# Patient Record
Sex: Female | Born: 1940 | Race: Black or African American | Hispanic: No | Smoking: Former smoker
Health system: Southern US, Community
[De-identification: ages and names within clinical notes are randomized; demographics above are authoritative.]

## PROBLEM LIST (undated history)

## (undated) DIAGNOSIS — F411 Generalized anxiety disorder: Secondary | ICD-10-CM

## (undated) DIAGNOSIS — K219 Gastro-esophageal reflux disease without esophagitis: Secondary | ICD-10-CM

## (undated) DIAGNOSIS — E119 Type 2 diabetes mellitus without complications: Secondary | ICD-10-CM

## (undated) DIAGNOSIS — T7840XA Allergy, unspecified, initial encounter: Secondary | ICD-10-CM

## (undated) DIAGNOSIS — I10 Essential (primary) hypertension: Secondary | ICD-10-CM

## (undated) DIAGNOSIS — K529 Noninfective gastroenteritis and colitis, unspecified: Secondary | ICD-10-CM

## (undated) DIAGNOSIS — J449 Chronic obstructive pulmonary disease, unspecified: Secondary | ICD-10-CM

## (undated) DIAGNOSIS — F419 Anxiety disorder, unspecified: Secondary | ICD-10-CM

## (undated) DIAGNOSIS — G4733 Obstructive sleep apnea (adult) (pediatric): Secondary | ICD-10-CM

## (undated) DIAGNOSIS — M109 Gout, unspecified: Secondary | ICD-10-CM

## (undated) DIAGNOSIS — M1990 Unspecified osteoarthritis, unspecified site: Secondary | ICD-10-CM

## (undated) DIAGNOSIS — N289 Disorder of kidney and ureter, unspecified: Secondary | ICD-10-CM

## (undated) HISTORY — DX: Type 2 diabetes mellitus without complications: E11.9

## (undated) HISTORY — DX: Allergy, unspecified, initial encounter: T78.40XA

## (undated) HISTORY — DX: Generalized anxiety disorder: F41.1

## (undated) HISTORY — DX: Gastro-esophageal reflux disease without esophagitis: K21.9

## (undated) HISTORY — PX: BREAST EXCISIONAL BIOPSY: SUR124

## (undated) HISTORY — PX: COLONOSCOPY: SHX174

## (undated) HISTORY — PX: ABDOMINAL SURGERY: SHX537

## (undated) HISTORY — DX: Chronic obstructive pulmonary disease, unspecified: J44.9

## (undated) HISTORY — DX: Anxiety disorder, unspecified: F41.9

## (undated) HISTORY — PX: KNEE SURGERY: SHX244

## (undated) HISTORY — DX: Obstructive sleep apnea (adult) (pediatric): G47.33

## (undated) HISTORY — PX: ABDOMINAL HYSTERECTOMY: SHX81

## (undated) HISTORY — DX: Noninfective gastroenteritis and colitis, unspecified: K52.9

---

## 1999-03-09 ENCOUNTER — Ambulatory Visit (HOSPITAL_COMMUNITY): Admission: RE | Admit: 1999-03-09 | Discharge: 1999-03-09 | Payer: Self-pay | Admitting: Family Medicine

## 1999-04-24 ENCOUNTER — Ambulatory Visit (HOSPITAL_COMMUNITY): Admission: RE | Admit: 1999-04-24 | Discharge: 1999-04-24 | Payer: Self-pay | Admitting: Internal Medicine

## 1999-08-09 ENCOUNTER — Emergency Department (HOSPITAL_COMMUNITY): Admission: EM | Admit: 1999-08-09 | Discharge: 1999-08-09 | Payer: Self-pay | Admitting: Internal Medicine

## 1999-08-09 ENCOUNTER — Encounter: Payer: Self-pay | Admitting: Internal Medicine

## 2000-04-03 ENCOUNTER — Encounter: Payer: Self-pay | Admitting: Family Medicine

## 2000-04-03 ENCOUNTER — Ambulatory Visit (HOSPITAL_COMMUNITY): Admission: RE | Admit: 2000-04-03 | Discharge: 2000-04-03 | Payer: Self-pay | Admitting: Family Medicine

## 2000-08-15 ENCOUNTER — Ambulatory Visit (HOSPITAL_COMMUNITY): Admission: RE | Admit: 2000-08-15 | Discharge: 2000-08-15 | Payer: Self-pay | Admitting: Family Medicine

## 2000-11-04 ENCOUNTER — Encounter: Admission: RE | Admit: 2000-11-04 | Discharge: 2000-11-04 | Payer: Self-pay | Admitting: Internal Medicine

## 2001-01-02 ENCOUNTER — Ambulatory Visit (HOSPITAL_COMMUNITY): Admission: RE | Admit: 2001-01-02 | Discharge: 2001-01-02 | Payer: Self-pay | Admitting: Family Medicine

## 2001-01-21 ENCOUNTER — Ambulatory Visit (HOSPITAL_COMMUNITY): Admission: RE | Admit: 2001-01-21 | Discharge: 2001-01-21 | Payer: Self-pay | Admitting: Family Medicine

## 2001-01-21 ENCOUNTER — Encounter: Payer: Self-pay | Admitting: Family Medicine

## 2001-01-30 ENCOUNTER — Encounter: Admission: RE | Admit: 2001-01-30 | Discharge: 2001-01-30 | Payer: Self-pay | Admitting: Orthopedic Surgery

## 2001-01-30 ENCOUNTER — Encounter: Payer: Self-pay | Admitting: Orthopedic Surgery

## 2001-05-05 ENCOUNTER — Encounter: Admission: RE | Admit: 2001-05-05 | Discharge: 2001-05-05 | Payer: Self-pay | Admitting: Internal Medicine

## 2001-06-08 ENCOUNTER — Inpatient Hospital Stay (HOSPITAL_COMMUNITY): Admission: AD | Admit: 2001-06-08 | Discharge: 2001-06-08 | Payer: Self-pay | Admitting: Obstetrics

## 2001-08-04 ENCOUNTER — Other Ambulatory Visit: Admission: RE | Admit: 2001-08-04 | Discharge: 2001-08-04 | Payer: Self-pay | Admitting: Family Medicine

## 2001-08-21 ENCOUNTER — Ambulatory Visit (HOSPITAL_COMMUNITY): Admission: RE | Admit: 2001-08-21 | Discharge: 2001-08-21 | Payer: Self-pay | Admitting: Family Medicine

## 2002-08-24 ENCOUNTER — Ambulatory Visit (HOSPITAL_COMMUNITY): Admission: RE | Admit: 2002-08-24 | Discharge: 2002-08-24 | Payer: Self-pay | Admitting: Family Medicine

## 2002-11-13 ENCOUNTER — Other Ambulatory Visit: Admission: RE | Admit: 2002-11-13 | Discharge: 2002-11-13 | Payer: Self-pay | Admitting: Family Medicine

## 2003-09-08 ENCOUNTER — Ambulatory Visit (HOSPITAL_COMMUNITY): Admission: RE | Admit: 2003-09-08 | Discharge: 2003-09-08 | Payer: Self-pay | Admitting: Family Medicine

## 2003-12-26 ENCOUNTER — Emergency Department (HOSPITAL_COMMUNITY): Admission: AD | Admit: 2003-12-26 | Discharge: 2003-12-26 | Payer: Self-pay | Admitting: Family Medicine

## 2004-01-26 ENCOUNTER — Emergency Department (HOSPITAL_COMMUNITY): Admission: EM | Admit: 2004-01-26 | Discharge: 2004-01-27 | Payer: Self-pay | Admitting: Emergency Medicine

## 2004-07-03 ENCOUNTER — Ambulatory Visit: Payer: Self-pay | Admitting: Family Medicine

## 2004-10-06 ENCOUNTER — Ambulatory Visit (HOSPITAL_COMMUNITY): Admission: RE | Admit: 2004-10-06 | Discharge: 2004-10-06 | Payer: Self-pay | Admitting: Family Medicine

## 2005-01-30 ENCOUNTER — Encounter: Admission: RE | Admit: 2005-01-30 | Discharge: 2005-01-30 | Payer: Self-pay | Admitting: Sports Medicine

## 2005-06-07 ENCOUNTER — Ambulatory Visit: Payer: Self-pay | Admitting: Family Medicine

## 2005-06-08 ENCOUNTER — Ambulatory Visit (HOSPITAL_COMMUNITY): Admission: RE | Admit: 2005-06-08 | Discharge: 2005-06-08 | Payer: Self-pay | Admitting: Family Medicine

## 2005-06-12 ENCOUNTER — Ambulatory Visit: Payer: Self-pay | Admitting: Family Medicine

## 2005-06-21 ENCOUNTER — Ambulatory Visit: Payer: Self-pay | Admitting: Family Medicine

## 2005-09-20 ENCOUNTER — Ambulatory Visit: Payer: Self-pay | Admitting: Family Medicine

## 2005-10-29 ENCOUNTER — Emergency Department (HOSPITAL_COMMUNITY): Admission: EM | Admit: 2005-10-29 | Discharge: 2005-10-29 | Payer: Self-pay | Admitting: Family Medicine

## 2005-11-21 ENCOUNTER — Ambulatory Visit (HOSPITAL_COMMUNITY): Admission: RE | Admit: 2005-11-21 | Discharge: 2005-11-21 | Payer: Self-pay | Admitting: Family Medicine

## 2006-01-21 ENCOUNTER — Ambulatory Visit: Payer: Self-pay | Admitting: Family Medicine

## 2006-02-20 ENCOUNTER — Emergency Department (HOSPITAL_COMMUNITY): Admission: EM | Admit: 2006-02-20 | Discharge: 2006-02-20 | Payer: Self-pay | Admitting: Emergency Medicine

## 2006-07-30 ENCOUNTER — Ambulatory Visit: Payer: Self-pay | Admitting: Family Medicine

## 2006-08-17 ENCOUNTER — Emergency Department (HOSPITAL_COMMUNITY): Admission: EM | Admit: 2006-08-17 | Discharge: 2006-08-17 | Payer: Self-pay | Admitting: Emergency Medicine

## 2006-09-25 ENCOUNTER — Ambulatory Visit: Payer: Self-pay | Admitting: Internal Medicine

## 2006-10-25 ENCOUNTER — Ambulatory Visit: Payer: Self-pay | Admitting: Family Medicine

## 2006-11-25 ENCOUNTER — Ambulatory Visit: Payer: Self-pay | Admitting: Family Medicine

## 2006-12-06 ENCOUNTER — Ambulatory Visit (HOSPITAL_COMMUNITY): Admission: RE | Admit: 2006-12-06 | Discharge: 2006-12-06 | Payer: Self-pay | Admitting: Family Medicine

## 2007-02-21 ENCOUNTER — Ambulatory Visit: Payer: Self-pay | Admitting: Family Medicine

## 2007-08-06 DIAGNOSIS — F29 Unspecified psychosis not due to a substance or known physiological condition: Secondary | ICD-10-CM | POA: Insufficient documentation

## 2007-08-06 DIAGNOSIS — L821 Other seborrheic keratosis: Secondary | ICD-10-CM

## 2007-08-06 DIAGNOSIS — K219 Gastro-esophageal reflux disease without esophagitis: Secondary | ICD-10-CM

## 2007-08-06 DIAGNOSIS — I10 Essential (primary) hypertension: Secondary | ICD-10-CM

## 2007-08-06 DIAGNOSIS — J309 Allergic rhinitis, unspecified: Secondary | ICD-10-CM

## 2007-08-06 DIAGNOSIS — N6009 Solitary cyst of unspecified breast: Secondary | ICD-10-CM

## 2007-08-06 HISTORY — DX: Essential (primary) hypertension: I10

## 2007-08-06 HISTORY — DX: Gastro-esophageal reflux disease without esophagitis: K21.9

## 2007-08-06 HISTORY — DX: Solitary cyst of unspecified breast: N60.09

## 2007-08-06 HISTORY — DX: Allergic rhinitis, unspecified: J30.9

## 2007-08-06 HISTORY — DX: Other seborrheic keratosis: L82.1

## 2007-12-18 ENCOUNTER — Ambulatory Visit: Payer: Self-pay | Admitting: Family Medicine

## 2007-12-18 ENCOUNTER — Other Ambulatory Visit: Admission: RE | Admit: 2007-12-18 | Discharge: 2007-12-18 | Payer: Self-pay | Admitting: Family Medicine

## 2007-12-18 ENCOUNTER — Encounter (INDEPENDENT_AMBULATORY_CARE_PROVIDER_SITE_OTHER): Payer: Self-pay | Admitting: Family Medicine

## 2007-12-18 LAB — CONVERTED CEMR LAB
Potassium: 3.7 meq/L (ref 3.5–5.3)
Sodium: 144 meq/L (ref 135–145)
TSH: 0.978 microintl units/mL (ref 0.350–5.50)
Vitamin B-12: 657 pg/mL (ref 211–911)

## 2007-12-30 ENCOUNTER — Ambulatory Visit (HOSPITAL_COMMUNITY): Admission: RE | Admit: 2007-12-30 | Discharge: 2007-12-30 | Payer: Self-pay | Admitting: Family Medicine

## 2008-04-07 ENCOUNTER — Emergency Department (HOSPITAL_COMMUNITY): Admission: EM | Admit: 2008-04-07 | Discharge: 2008-04-07 | Payer: Self-pay | Admitting: Family Medicine

## 2009-01-26 ENCOUNTER — Ambulatory Visit (HOSPITAL_COMMUNITY): Admission: RE | Admit: 2009-01-26 | Discharge: 2009-01-26 | Payer: Self-pay | Admitting: Family Medicine

## 2009-04-08 ENCOUNTER — Ambulatory Visit: Payer: Self-pay | Admitting: Family Medicine

## 2009-04-08 LAB — CONVERTED CEMR LAB
ALT: 15 units/L (ref 0–35)
Basophils Relative: 0 % (ref 0–1)
CO2: 25 meq/L (ref 19–32)
Cholesterol: 173 mg/dL (ref 0–200)
LDL Cholesterol: 87 mg/dL (ref 0–99)
Lymphocytes Relative: 16 % (ref 12–46)
Lymphs Abs: 0.9 10*3/uL (ref 0.7–4.0)
MCHC: 31.8 g/dL (ref 30.0–36.0)
Monocytes Relative: 7 % (ref 3–12)
Neutro Abs: 4.5 10*3/uL (ref 1.7–7.7)
Neutrophils Relative %: 77 % (ref 43–77)
RBC: 5.02 M/uL (ref 3.87–5.11)
Sodium: 143 meq/L (ref 135–145)
Total Bilirubin: 0.5 mg/dL (ref 0.3–1.2)
Total Protein: 6.9 g/dL (ref 6.0–8.3)
VLDL: 45 mg/dL — ABNORMAL HIGH (ref 0–40)
Vit D, 25-Hydroxy: 22 ng/mL — ABNORMAL LOW (ref 30–89)
WBC: 5.8 10*3/uL (ref 4.0–10.5)

## 2009-05-26 ENCOUNTER — Encounter (INDEPENDENT_AMBULATORY_CARE_PROVIDER_SITE_OTHER): Payer: Self-pay | Admitting: Family Medicine

## 2009-05-26 ENCOUNTER — Ambulatory Visit: Payer: Self-pay | Admitting: Family Medicine

## 2009-05-26 ENCOUNTER — Other Ambulatory Visit: Admission: RE | Admit: 2009-05-26 | Discharge: 2009-05-26 | Payer: Self-pay | Admitting: Family Medicine

## 2009-06-10 ENCOUNTER — Ambulatory Visit: Payer: Self-pay | Admitting: Gastroenterology

## 2009-07-06 ENCOUNTER — Telehealth: Payer: Self-pay | Admitting: Gastroenterology

## 2009-07-11 ENCOUNTER — Ambulatory Visit: Payer: Self-pay | Admitting: Gastroenterology

## 2009-10-20 ENCOUNTER — Ambulatory Visit: Payer: Self-pay | Admitting: Family Medicine

## 2009-10-21 ENCOUNTER — Ambulatory Visit (HOSPITAL_COMMUNITY): Admission: RE | Admit: 2009-10-21 | Discharge: 2009-10-21 | Payer: Self-pay | Admitting: Family Medicine

## 2010-01-19 ENCOUNTER — Ambulatory Visit: Payer: Self-pay | Admitting: Internal Medicine

## 2010-02-20 ENCOUNTER — Ambulatory Visit (HOSPITAL_COMMUNITY): Admission: RE | Admit: 2010-02-20 | Discharge: 2010-02-20 | Payer: Self-pay | Admitting: Internal Medicine

## 2010-10-29 ENCOUNTER — Encounter: Payer: Self-pay | Admitting: Family Medicine

## 2010-11-16 ENCOUNTER — Encounter (INDEPENDENT_AMBULATORY_CARE_PROVIDER_SITE_OTHER): Payer: Self-pay | Admitting: Family Medicine

## 2010-11-16 LAB — CONVERTED CEMR LAB
CO2: 28 meq/L (ref 19–32)
Chloride: 104 meq/L (ref 96–112)
Glucose, Bld: 105 mg/dL — ABNORMAL HIGH (ref 70–99)
Lymphocytes Relative: 25 % (ref 12–46)
Lymphs Abs: 1.7 10*3/uL (ref 0.7–4.0)
MCV: 89.9 fL (ref 78.0–100.0)
Monocytes Relative: 10 % (ref 3–12)
Neutro Abs: 4.2 10*3/uL (ref 1.7–7.7)
Neutrophils Relative %: 64 % (ref 43–77)
Potassium: 4.3 meq/L (ref 3.5–5.3)
RBC: 5.23 M/uL — ABNORMAL HIGH (ref 3.87–5.11)
Sodium: 145 meq/L (ref 135–145)
WBC: 6.6 10*3/uL (ref 4.0–10.5)

## 2010-11-21 ENCOUNTER — Ambulatory Visit (HOSPITAL_COMMUNITY)
Admission: RE | Admit: 2010-11-21 | Discharge: 2010-11-21 | Disposition: A | Discharge status: Home / Self Care | Payer: Medicare Other | Source: Ambulatory Visit | Attending: Family Medicine | Admitting: Family Medicine

## 2010-11-21 ENCOUNTER — Other Ambulatory Visit (HOSPITAL_COMMUNITY): Payer: Self-pay | Admitting: Family Medicine

## 2010-11-21 DIAGNOSIS — M549 Dorsalgia, unspecified: Secondary | ICD-10-CM | POA: Insufficient documentation

## 2010-11-21 DIAGNOSIS — G1223 Primary lateral sclerosis: Secondary | ICD-10-CM | POA: Insufficient documentation

## 2010-11-21 DIAGNOSIS — IMO0002 Reserved for concepts with insufficient information to code with codable children: Secondary | ICD-10-CM | POA: Insufficient documentation

## 2011-02-06 ENCOUNTER — Other Ambulatory Visit (HOSPITAL_COMMUNITY): Payer: Self-pay | Admitting: Family Medicine

## 2011-02-06 DIAGNOSIS — Z1231 Encounter for screening mammogram for malignant neoplasm of breast: Secondary | ICD-10-CM

## 2011-02-22 ENCOUNTER — Ambulatory Visit (HOSPITAL_COMMUNITY)
Admission: RE | Admit: 2011-02-22 | Discharge: 2011-02-22 | Disposition: A | Discharge status: Home / Self Care | Payer: Medicare Other | Source: Ambulatory Visit | Attending: Family Medicine | Admitting: Family Medicine

## 2011-02-22 DIAGNOSIS — Z1231 Encounter for screening mammogram for malignant neoplasm of breast: Secondary | ICD-10-CM | POA: Insufficient documentation

## 2011-05-02 ENCOUNTER — Ambulatory Visit (INDEPENDENT_AMBULATORY_CARE_PROVIDER_SITE_OTHER): Payer: Medicare Other

## 2011-05-02 ENCOUNTER — Inpatient Hospital Stay (INDEPENDENT_AMBULATORY_CARE_PROVIDER_SITE_OTHER)
Admission: RE | Admit: 2011-05-02 | Discharge: 2011-05-02 | Disposition: A | Discharge status: Home / Self Care | Payer: Medicare Other | Source: Ambulatory Visit | Attending: Emergency Medicine | Admitting: Emergency Medicine

## 2011-05-02 DIAGNOSIS — S93409A Sprain of unspecified ligament of unspecified ankle, initial encounter: Secondary | ICD-10-CM

## 2011-10-29 DIAGNOSIS — M25579 Pain in unspecified ankle and joints of unspecified foot: Secondary | ICD-10-CM | POA: Diagnosis not present

## 2011-10-29 DIAGNOSIS — L723 Sebaceous cyst: Secondary | ICD-10-CM | POA: Diagnosis not present

## 2011-10-29 DIAGNOSIS — M549 Dorsalgia, unspecified: Secondary | ICD-10-CM | POA: Diagnosis not present

## 2011-11-05 DIAGNOSIS — M461 Sacroiliitis, not elsewhere classified: Secondary | ICD-10-CM | POA: Diagnosis not present

## 2011-11-19 DIAGNOSIS — M461 Sacroiliitis, not elsewhere classified: Secondary | ICD-10-CM | POA: Diagnosis not present

## 2011-12-07 DIAGNOSIS — R252 Cramp and spasm: Secondary | ICD-10-CM | POA: Diagnosis not present

## 2011-12-07 DIAGNOSIS — M109 Gout, unspecified: Secondary | ICD-10-CM | POA: Diagnosis not present

## 2011-12-07 DIAGNOSIS — I10 Essential (primary) hypertension: Secondary | ICD-10-CM | POA: Diagnosis not present

## 2012-01-16 DIAGNOSIS — M25569 Pain in unspecified knee: Secondary | ICD-10-CM | POA: Diagnosis not present

## 2012-01-16 DIAGNOSIS — M1710 Unilateral primary osteoarthritis, unspecified knee: Secondary | ICD-10-CM | POA: Diagnosis not present

## 2012-02-26 ENCOUNTER — Other Ambulatory Visit (HOSPITAL_COMMUNITY): Payer: Self-pay | Admitting: Internal Medicine

## 2012-02-26 DIAGNOSIS — Z1231 Encounter for screening mammogram for malignant neoplasm of breast: Secondary | ICD-10-CM

## 2012-03-11 DIAGNOSIS — R609 Edema, unspecified: Secondary | ICD-10-CM | POA: Diagnosis not present

## 2012-03-21 DIAGNOSIS — R609 Edema, unspecified: Secondary | ICD-10-CM | POA: Diagnosis not present

## 2012-03-21 DIAGNOSIS — I10 Essential (primary) hypertension: Secondary | ICD-10-CM | POA: Diagnosis not present

## 2012-03-21 DIAGNOSIS — M25519 Pain in unspecified shoulder: Secondary | ICD-10-CM | POA: Diagnosis not present

## 2012-03-27 ENCOUNTER — Ambulatory Visit (HOSPITAL_COMMUNITY)
Admission: RE | Admit: 2012-03-27 | Discharge: 2012-03-27 | Disposition: A | Discharge status: Home / Self Care | Payer: Medicare Other | Source: Ambulatory Visit | Attending: Internal Medicine | Admitting: Internal Medicine

## 2012-03-27 DIAGNOSIS — Z1231 Encounter for screening mammogram for malignant neoplasm of breast: Secondary | ICD-10-CM | POA: Diagnosis not present

## 2012-04-09 DIAGNOSIS — M25519 Pain in unspecified shoulder: Secondary | ICD-10-CM | POA: Diagnosis not present

## 2012-06-18 DIAGNOSIS — H40019 Open angle with borderline findings, low risk, unspecified eye: Secondary | ICD-10-CM | POA: Diagnosis not present

## 2012-06-24 DIAGNOSIS — K920 Hematemesis: Secondary | ICD-10-CM | POA: Diagnosis not present

## 2012-06-24 DIAGNOSIS — M2060 Acquired deformities of toe(s), unspecified, unspecified foot: Secondary | ICD-10-CM | POA: Diagnosis not present

## 2012-07-08 DIAGNOSIS — R413 Other amnesia: Secondary | ICD-10-CM | POA: Diagnosis not present

## 2012-07-09 DIAGNOSIS — R413 Other amnesia: Secondary | ICD-10-CM | POA: Diagnosis not present

## 2012-09-19 DIAGNOSIS — K5732 Diverticulitis of large intestine without perforation or abscess without bleeding: Secondary | ICD-10-CM | POA: Diagnosis not present

## 2012-09-19 DIAGNOSIS — R109 Unspecified abdominal pain: Secondary | ICD-10-CM | POA: Diagnosis not present

## 2012-09-30 DIAGNOSIS — R197 Diarrhea, unspecified: Secondary | ICD-10-CM | POA: Diagnosis not present

## 2012-10-21 DIAGNOSIS — H40019 Open angle with borderline findings, low risk, unspecified eye: Secondary | ICD-10-CM | POA: Diagnosis not present

## 2012-12-01 DIAGNOSIS — M545 Low back pain: Secondary | ICD-10-CM | POA: Diagnosis not present

## 2013-01-29 DIAGNOSIS — R450 Nervousness: Secondary | ICD-10-CM | POA: Diagnosis not present

## 2013-01-29 DIAGNOSIS — F329 Major depressive disorder, single episode, unspecified: Secondary | ICD-10-CM | POA: Diagnosis not present

## 2013-02-26 DIAGNOSIS — R109 Unspecified abdominal pain: Secondary | ICD-10-CM | POA: Diagnosis not present

## 2013-02-26 DIAGNOSIS — I10 Essential (primary) hypertension: Secondary | ICD-10-CM | POA: Diagnosis not present

## 2013-02-26 DIAGNOSIS — G479 Sleep disorder, unspecified: Secondary | ICD-10-CM | POA: Diagnosis not present

## 2013-03-31 DIAGNOSIS — R109 Unspecified abdominal pain: Secondary | ICD-10-CM | POA: Diagnosis not present

## 2013-03-31 DIAGNOSIS — R197 Diarrhea, unspecified: Secondary | ICD-10-CM | POA: Diagnosis not present

## 2013-04-09 ENCOUNTER — Other Ambulatory Visit (HOSPITAL_COMMUNITY): Payer: Self-pay | Admitting: Internal Medicine

## 2013-04-09 DIAGNOSIS — Z1231 Encounter for screening mammogram for malignant neoplasm of breast: Secondary | ICD-10-CM

## 2013-04-16 ENCOUNTER — Ambulatory Visit (HOSPITAL_COMMUNITY): Payer: Medicare Other

## 2013-04-23 ENCOUNTER — Ambulatory Visit (HOSPITAL_COMMUNITY): Payer: Medicare Other

## 2013-04-24 ENCOUNTER — Emergency Department (INDEPENDENT_AMBULATORY_CARE_PROVIDER_SITE_OTHER): Payer: Medicare Other

## 2013-04-24 ENCOUNTER — Emergency Department (INDEPENDENT_AMBULATORY_CARE_PROVIDER_SITE_OTHER)
Admission: EM | Admit: 2013-04-24 | Discharge: 2013-04-24 | Disposition: A | Discharge status: Home / Self Care | Payer: Medicare Other | Source: Home / Self Care

## 2013-04-24 ENCOUNTER — Encounter (HOSPITAL_COMMUNITY): Payer: Self-pay | Admitting: *Deleted

## 2013-04-24 DIAGNOSIS — IMO0002 Reserved for concepts with insufficient information to code with codable children: Secondary | ICD-10-CM | POA: Diagnosis not present

## 2013-04-24 DIAGNOSIS — M25552 Pain in left hip: Secondary | ICD-10-CM

## 2013-04-24 DIAGNOSIS — M169 Osteoarthritis of hip, unspecified: Secondary | ICD-10-CM | POA: Diagnosis not present

## 2013-04-24 DIAGNOSIS — M25559 Pain in unspecified hip: Secondary | ICD-10-CM | POA: Diagnosis not present

## 2013-04-24 DIAGNOSIS — M5416 Radiculopathy, lumbar region: Secondary | ICD-10-CM

## 2013-04-24 HISTORY — DX: Unspecified osteoarthritis, unspecified site: M19.90

## 2013-04-24 HISTORY — DX: Essential (primary) hypertension: I10

## 2013-04-24 HISTORY — DX: Gout, unspecified: M10.9

## 2013-04-24 HISTORY — DX: Disorder of kidney and ureter, unspecified: N28.9

## 2013-04-24 MED ORDER — METHYLPREDNISOLONE ACETATE 40 MG/ML IJ SUSP
40.0000 mg | Freq: Once | INTRAMUSCULAR | Status: AC
  Administered 2013-04-24: 40 mg via INTRAMUSCULAR

## 2013-04-24 MED ORDER — METHYLPREDNISOLONE 4 MG PO KIT: PACK | ORAL | Status: DC

## 2013-04-24 MED ORDER — METHYLPREDNISOLONE ACETATE 40 MG/ML IJ SUSP
INTRAMUSCULAR | Status: AC
  Filled 2013-04-24: qty 5

## 2013-04-24 NOTE — ED Provider Notes (Signed)
Medical screening examination/treatment/procedure(s) were performed by a resident physician or non-physician practitioner and as the supervising physician I was immediately available for consultation/collaboration.  Clementeen Graham, MD   Rodolph Bong, MD 04/24/13 [phone]

## 2013-04-24 NOTE — ED Notes (Signed)
Pt  Reports  l  Hip  Pain     That  She  Has  Had  In  Past  She  Reports  It is  Worse  Today       She  denys  Recent  Injury     She  Reports  The  Pain radiates    Down  l  Leg     She  reoports  The  Symptoms  Not  releived  By  Rest  ,heat ,cold

## 2013-04-24 NOTE — ED Provider Notes (Signed)
History    CSN: [id number] Arrival date & time 04/24/13  1045  First MD Initiated Contact with Patient 04/24/13 1130     Chief Complaint  Patient presents with  . Hip Pain   (Consider location/radiation/quality/duration/timing/severity/associated sxs/prior Treatment) HPI  72 yo bf presents today with LBP, left buttock/lat hip pain with radiation to her calf.  This worse over the last couple of weeks.  Denies groin pain.  No injury.  sched to see Dr Allie Bossier for this next week but pain worsening with ambulation.  Known hx of lumbar spine issues and greater trochanteric bursitis.  Daughter present today.   Past Medical History  Diagnosis Date  . Arthritis   . Hypertension   . Gout   . Kidney disease    Past Surgical History  Procedure Laterality Date  . Abdominal surgery    . Abdominal hysterectomy    . Knee surgery     No family history on file. History  Substance Use Topics  . Smoking status: Current Every Day Smoker  . Smokeless tobacco: Not on file  . Alcohol Use: No   OB History   Grav Para Term Preterm Abortions TAB SAB Ect Mult Living                 Review of Systems  Constitutional: Negative.   Eyes: Negative.   Respiratory: Negative.   Cardiovascular: Negative.   Gastrointestinal: Negative.   Musculoskeletal: Positive for back pain.  Neurological: Negative.   Psychiatric/Behavioral: Negative.     Allergies  Hydromorphone hcl; Naproxen sodium; Sulfonamide derivatives; and Tramadol hcl  Home Medications   Current Outpatient Rx  Name  Route  Sig  Dispense  Refill  . amLODipine (NORVASC) 5 MG tablet   Oral   Take 5 mg by mouth daily.         . Amlodipine Besy-Benazepril HCl (LOTREL PO)   Oral   Take by mouth.         Marland Kitchen HYDROCODONE-ACETAMINOPHEN PO   Oral   Take by mouth.         . methylPREDNISolone (MEDROL DOSEPAK) 4 MG tablet      6 day dose pack.  Take as directed.   21 tablet   0    BP 132/63  Pulse 62  Temp(Src) 98.7  F (37.1 C) (Oral)  Resp 16  SpO2 96% Physical Exam  Constitutional: She is oriented to person, place, and time. She appears well-developed and well-nourished.  HENT:  Head: Normocephalic and atraumatic.  Eyes: Conjunctivae and EOM are normal. Pupils are equal, round, and reactive to light.  Neck: Normal range of motion.  Pulmonary/Chest: Effort normal.  Musculoskeletal:  Gait antalgic.  Good painless ROM bilat hips.  Mod to marked left sciatic notch and greater trochanter bursa tenderness.    Neurological: She is alert and oriented to person, place, and time.  Skin: Skin is warm.  Psychiatric: She has a normal mood and affect.    ED Course  Procedures (including critical care time) Labs Reviewed - No data to display Dg Hip Complete Left  04/24/2013   *RADIOLOGY REPORT*  Clinical Data: Left hip pain on a chronic basis.  Much worse lately.  No trauma.  LEFT HIP - COMPLETE 2+ VIEW  Comparison: None.  Findings: Mild bilateral hip joint space narrowing.  No fracture or dislocation.  Regional bones of the pelvis appear intact.  Mild degenerative change both sacroiliac joints.  IMPRESSION: Degenerative changes as described.  Original Report Authenticated By: Davonna Belling, M.D.   1. Lumbar radiculopathy   2. Hip pain, left     MDM  Xray findings reviewed with patient and daughter.  Gave depo 40mg  im injection today along with a script for medrol dose pack.  F/u with dr Magnus Ivan ([facility]) next week as scheduled.  Advised that she may need further workup with lumbar spine mri scan to see if she is a candidate for epidural steroid injections if she doesn't get good relief with medrol dose pack.  All questions answered.    Meds ordered this encounter  Medications  . Amlodipine Besy-Benazepril HCl (LOTREL PO)    Sig: Take by mouth.  Marland Kitchen HYDROCODONE-ACETAMINOPHEN PO    Sig: Take by mouth.  Marland Kitchen amLODipine (NORVASC) 5 MG tablet    Sig: Take 5 mg by mouth daily.  . methylPREDNISolone  acetate (DEPO-MEDROL) injection 40 mg    Sig:   . methylPREDNISolone (MEDROL DOSEPAK) 4 MG tablet    Sig: 6 day dose pack.  Take as directed.    Dispense:  21 tablet    Refill:  0    Zonia Kief, PA-C 04/24/13 1234

## 2013-04-29 DIAGNOSIS — M545 Low back pain: Secondary | ICD-10-CM | POA: Diagnosis not present

## 2013-05-05 DIAGNOSIS — M5430 Sciatica, unspecified side: Secondary | ICD-10-CM | POA: Diagnosis not present

## 2013-05-05 DIAGNOSIS — M5137 Other intervertebral disc degeneration, lumbosacral region: Secondary | ICD-10-CM | POA: Diagnosis not present

## 2013-05-05 DIAGNOSIS — M545 Low back pain: Secondary | ICD-10-CM | POA: Diagnosis not present

## 2013-05-11 DIAGNOSIS — M545 Low back pain: Secondary | ICD-10-CM | POA: Diagnosis not present

## 2013-05-11 DIAGNOSIS — M5137 Other intervertebral disc degeneration, lumbosacral region: Secondary | ICD-10-CM | POA: Diagnosis not present

## 2013-05-11 DIAGNOSIS — M5430 Sciatica, unspecified side: Secondary | ICD-10-CM | POA: Diagnosis not present

## 2013-05-13 DIAGNOSIS — M5137 Other intervertebral disc degeneration, lumbosacral region: Secondary | ICD-10-CM | POA: Diagnosis not present

## 2013-05-13 DIAGNOSIS — M5430 Sciatica, unspecified side: Secondary | ICD-10-CM | POA: Diagnosis not present

## 2013-05-13 DIAGNOSIS — M545 Low back pain: Secondary | ICD-10-CM | POA: Diagnosis not present

## 2013-05-14 ENCOUNTER — Ambulatory Visit (HOSPITAL_COMMUNITY)
Admission: RE | Admit: 2013-05-14 | Discharge: 2013-05-14 | Disposition: A | Discharge status: Home / Self Care | Payer: Medicare Other | Source: Ambulatory Visit | Attending: Internal Medicine | Admitting: Internal Medicine

## 2013-05-14 DIAGNOSIS — Z1231 Encounter for screening mammogram for malignant neoplasm of breast: Secondary | ICD-10-CM | POA: Insufficient documentation

## 2013-05-18 DIAGNOSIS — M5430 Sciatica, unspecified side: Secondary | ICD-10-CM | POA: Diagnosis not present

## 2013-05-18 DIAGNOSIS — M545 Low back pain: Secondary | ICD-10-CM | POA: Diagnosis not present

## 2013-05-18 DIAGNOSIS — M5137 Other intervertebral disc degeneration, lumbosacral region: Secondary | ICD-10-CM | POA: Diagnosis not present

## 2013-05-27 DIAGNOSIS — M5430 Sciatica, unspecified side: Secondary | ICD-10-CM | POA: Diagnosis not present

## 2013-05-27 DIAGNOSIS — M545 Low back pain: Secondary | ICD-10-CM | POA: Diagnosis not present

## 2013-05-28 DIAGNOSIS — M545 Low back pain: Secondary | ICD-10-CM | POA: Diagnosis not present

## 2013-05-28 DIAGNOSIS — M5430 Sciatica, unspecified side: Secondary | ICD-10-CM | POA: Diagnosis not present

## 2013-06-10 DIAGNOSIS — H40019 Open angle with borderline findings, low risk, unspecified eye: Secondary | ICD-10-CM | POA: Diagnosis not present

## 2013-07-27 DIAGNOSIS — M461 Sacroiliitis, not elsewhere classified: Secondary | ICD-10-CM | POA: Diagnosis not present

## 2013-09-20 DIAGNOSIS — Z23 Encounter for immunization: Secondary | ICD-10-CM | POA: Diagnosis not present

## 2013-11-20 DIAGNOSIS — M545 Low back pain, unspecified: Secondary | ICD-10-CM | POA: Diagnosis not present

## 2014-02-19 ENCOUNTER — Ambulatory Visit
Admission: RE | Admit: 2014-02-19 | Discharge: 2014-02-19 | Disposition: A | Discharge status: Home / Self Care | Payer: Medicare Other | Source: Ambulatory Visit | Attending: Internal Medicine | Admitting: Internal Medicine

## 2014-02-19 ENCOUNTER — Other Ambulatory Visit: Payer: Self-pay | Admitting: Internal Medicine

## 2014-02-19 DIAGNOSIS — R059 Cough, unspecified: Secondary | ICD-10-CM

## 2014-02-19 DIAGNOSIS — R05 Cough: Secondary | ICD-10-CM

## 2014-02-19 DIAGNOSIS — R0602 Shortness of breath: Secondary | ICD-10-CM | POA: Diagnosis not present

## 2014-02-19 DIAGNOSIS — R350 Frequency of micturition: Secondary | ICD-10-CM | POA: Diagnosis not present

## 2014-02-23 DIAGNOSIS — M109 Gout, unspecified: Secondary | ICD-10-CM | POA: Diagnosis not present

## 2014-02-23 DIAGNOSIS — Z23 Encounter for immunization: Secondary | ICD-10-CM | POA: Diagnosis not present

## 2014-02-23 DIAGNOSIS — E2839 Other primary ovarian failure: Secondary | ICD-10-CM | POA: Diagnosis not present

## 2014-02-23 DIAGNOSIS — J40 Bronchitis, not specified as acute or chronic: Secondary | ICD-10-CM | POA: Diagnosis not present

## 2014-02-23 DIAGNOSIS — I10 Essential (primary) hypertension: Secondary | ICD-10-CM | POA: Diagnosis not present

## 2014-02-23 DIAGNOSIS — F17200 Nicotine dependence, unspecified, uncomplicated: Secondary | ICD-10-CM | POA: Diagnosis not present

## 2014-02-24 ENCOUNTER — Other Ambulatory Visit (HOSPITAL_COMMUNITY): Payer: Self-pay | Admitting: Internal Medicine

## 2014-02-24 DIAGNOSIS — E2839 Other primary ovarian failure: Secondary | ICD-10-CM

## 2014-03-10 ENCOUNTER — Ambulatory Visit (HOSPITAL_COMMUNITY): Payer: Medicare Other

## 2014-03-10 ENCOUNTER — Other Ambulatory Visit (HOSPITAL_COMMUNITY): Payer: Medicare Other

## 2014-03-12 ENCOUNTER — Ambulatory Visit (HOSPITAL_COMMUNITY)
Admission: RE | Admit: 2014-03-12 | Discharge: 2014-03-12 | Disposition: A | Discharge status: Home / Self Care | Payer: Medicare Other | Source: Ambulatory Visit | Attending: Internal Medicine | Admitting: Internal Medicine

## 2014-03-12 DIAGNOSIS — E2839 Other primary ovarian failure: Secondary | ICD-10-CM

## 2014-03-12 DIAGNOSIS — Z13820 Encounter for screening for osteoporosis: Secondary | ICD-10-CM | POA: Diagnosis not present

## 2014-03-12 DIAGNOSIS — Z780 Asymptomatic menopausal state: Secondary | ICD-10-CM | POA: Diagnosis not present

## 2014-03-12 DIAGNOSIS — M810 Age-related osteoporosis without current pathological fracture: Secondary | ICD-10-CM | POA: Diagnosis not present

## 2014-03-30 DIAGNOSIS — H40019 Open angle with borderline findings, low risk, unspecified eye: Secondary | ICD-10-CM | POA: Diagnosis not present

## 2014-04-15 ENCOUNTER — Encounter: Payer: Self-pay | Admitting: *Deleted

## 2014-04-22 ENCOUNTER — Encounter: Payer: Medicare Other | Admitting: Obstetrics & Gynecology

## 2014-05-28 DIAGNOSIS — M545 Low back pain, unspecified: Secondary | ICD-10-CM | POA: Diagnosis not present

## 2014-05-28 DIAGNOSIS — M810 Age-related osteoporosis without current pathological fracture: Secondary | ICD-10-CM | POA: Diagnosis not present

## 2014-05-28 DIAGNOSIS — M2559 Pain in other specified joint: Secondary | ICD-10-CM | POA: Diagnosis not present

## 2014-06-11 ENCOUNTER — Encounter: Payer: Self-pay | Admitting: Gastroenterology

## 2014-07-13 ENCOUNTER — Other Ambulatory Visit (HOSPITAL_COMMUNITY): Payer: Self-pay | Admitting: Internal Medicine

## 2014-07-13 DIAGNOSIS — Z1231 Encounter for screening mammogram for malignant neoplasm of breast: Secondary | ICD-10-CM

## 2014-07-22 ENCOUNTER — Ambulatory Visit (HOSPITAL_COMMUNITY): Payer: Medicare Other

## 2014-07-26 DIAGNOSIS — H40023 Open angle with borderline findings, high risk, bilateral: Secondary | ICD-10-CM | POA: Diagnosis not present

## 2014-08-12 ENCOUNTER — Ambulatory Visit (HOSPITAL_COMMUNITY): Payer: Medicare Other

## 2014-08-12 ENCOUNTER — Ambulatory Visit (HOSPITAL_COMMUNITY)
Admission: RE | Admit: 2014-08-12 | Discharge: 2014-08-12 | Disposition: A | Discharge status: Home / Self Care | Payer: Medicare Other | Source: Ambulatory Visit | Attending: Internal Medicine | Admitting: Internal Medicine

## 2014-08-12 DIAGNOSIS — Z1231 Encounter for screening mammogram for malignant neoplasm of breast: Secondary | ICD-10-CM

## 2014-08-19 ENCOUNTER — Other Ambulatory Visit: Payer: Self-pay | Admitting: Internal Medicine

## 2014-08-19 DIAGNOSIS — R928 Other abnormal and inconclusive findings on diagnostic imaging of breast: Secondary | ICD-10-CM

## 2014-08-26 DIAGNOSIS — M2550 Pain in unspecified joint: Secondary | ICD-10-CM | POA: Diagnosis not present

## 2014-08-26 DIAGNOSIS — I10 Essential (primary) hypertension: Secondary | ICD-10-CM | POA: Diagnosis not present

## 2014-08-26 DIAGNOSIS — Z0000 Encounter for general adult medical examination without abnormal findings: Secondary | ICD-10-CM | POA: Diagnosis not present

## 2014-08-26 DIAGNOSIS — M109 Gout, unspecified: Secondary | ICD-10-CM | POA: Diagnosis not present

## 2014-08-26 DIAGNOSIS — Z23 Encounter for immunization: Secondary | ICD-10-CM | POA: Diagnosis not present

## 2014-08-26 DIAGNOSIS — M810 Age-related osteoporosis without current pathological fracture: Secondary | ICD-10-CM | POA: Diagnosis not present

## 2014-08-26 DIAGNOSIS — Z1389 Encounter for screening for other disorder: Secondary | ICD-10-CM | POA: Diagnosis not present

## 2014-09-10 ENCOUNTER — Ambulatory Visit
Admission: RE | Admit: 2014-09-10 | Discharge: 2014-09-10 | Disposition: A | Discharge status: Home / Self Care | Payer: Medicare Other | Source: Ambulatory Visit | Attending: Internal Medicine | Admitting: Internal Medicine

## 2014-09-10 DIAGNOSIS — N6002 Solitary cyst of left breast: Secondary | ICD-10-CM | POA: Diagnosis not present

## 2014-09-10 DIAGNOSIS — R928 Other abnormal and inconclusive findings on diagnostic imaging of breast: Secondary | ICD-10-CM

## 2014-11-04 DIAGNOSIS — J209 Acute bronchitis, unspecified: Secondary | ICD-10-CM | POA: Diagnosis not present

## 2014-11-29 DIAGNOSIS — H40013 Open angle with borderline findings, low risk, bilateral: Secondary | ICD-10-CM | POA: Diagnosis not present

## 2014-12-23 DIAGNOSIS — J209 Acute bronchitis, unspecified: Secondary | ICD-10-CM | POA: Diagnosis not present

## 2015-02-17 DIAGNOSIS — J449 Chronic obstructive pulmonary disease, unspecified: Secondary | ICD-10-CM | POA: Diagnosis not present

## 2015-02-17 DIAGNOSIS — R0789 Other chest pain: Secondary | ICD-10-CM | POA: Diagnosis not present

## 2015-02-17 DIAGNOSIS — I10 Essential (primary) hypertension: Secondary | ICD-10-CM | POA: Diagnosis not present

## 2015-02-17 DIAGNOSIS — M810 Age-related osteoporosis without current pathological fracture: Secondary | ICD-10-CM | POA: Diagnosis not present

## 2015-02-17 DIAGNOSIS — M109 Gout, unspecified: Secondary | ICD-10-CM | POA: Diagnosis not present

## 2015-02-18 ENCOUNTER — Other Ambulatory Visit (HOSPITAL_COMMUNITY): Payer: Self-pay | Admitting: Respiratory Therapy

## 2015-02-18 DIAGNOSIS — J441 Chronic obstructive pulmonary disease with (acute) exacerbation: Secondary | ICD-10-CM

## 2015-03-01 ENCOUNTER — Other Ambulatory Visit: Payer: Self-pay | Admitting: Internal Medicine

## 2015-03-01 DIAGNOSIS — N6320 Unspecified lump in the left breast, unspecified quadrant: Secondary | ICD-10-CM

## 2015-03-09 ENCOUNTER — Ambulatory Visit
Admission: RE | Admit: 2015-03-09 | Discharge: 2015-03-09 | Disposition: A | Discharge status: Home / Self Care | Payer: Medicare Other | Source: Ambulatory Visit | Attending: Internal Medicine | Admitting: Internal Medicine

## 2015-03-09 DIAGNOSIS — R928 Other abnormal and inconclusive findings on diagnostic imaging of breast: Secondary | ICD-10-CM | POA: Diagnosis not present

## 2015-03-09 DIAGNOSIS — N6320 Unspecified lump in the left breast, unspecified quadrant: Secondary | ICD-10-CM

## 2015-03-17 DIAGNOSIS — R1032 Left lower quadrant pain: Secondary | ICD-10-CM | POA: Diagnosis not present

## 2015-03-17 DIAGNOSIS — R1012 Left upper quadrant pain: Secondary | ICD-10-CM | POA: Diagnosis not present

## 2015-03-17 DIAGNOSIS — R197 Diarrhea, unspecified: Secondary | ICD-10-CM | POA: Diagnosis not present

## 2015-04-15 ENCOUNTER — Encounter (HOSPITAL_COMMUNITY): Payer: Medicare Other

## 2015-04-22 ENCOUNTER — Encounter (HOSPITAL_COMMUNITY): Payer: Medicare Other

## 2015-04-22 ENCOUNTER — Ambulatory Visit (HOSPITAL_COMMUNITY)
Admission: RE | Admit: 2015-04-22 | Discharge: 2015-04-22 | Disposition: A | Discharge status: Home / Self Care | Payer: Medicare Other | Source: Ambulatory Visit | Attending: Internal Medicine | Admitting: Internal Medicine

## 2015-04-22 DIAGNOSIS — J441 Chronic obstructive pulmonary disease with (acute) exacerbation: Secondary | ICD-10-CM | POA: Insufficient documentation

## 2015-04-22 DIAGNOSIS — H40013 Open angle with borderline findings, low risk, bilateral: Secondary | ICD-10-CM | POA: Diagnosis not present

## 2015-04-25 LAB — PULMONARY FUNCTION TEST
FEF 25-75 Pre: 0.75 L/sec
FEF2575-%PRED-PRE: 56 %
FEV1-%Pred-Pre: 56 %
FEV1-PRE: 0.83 L
FEV1FVC-%Pred-Pre: 109 %
FEV6-%Pred-Pre: 53 %
FEV6-PRE: 0.98 L
FEV6FVC-%Pred-Pre: 105 %
FVC-%Pred-Pre: 51 %
FVC-Pre: 0.98 L
Pre FEV1/FVC ratio: 84 %
Pre FEV6/FVC Ratio: 100 %
RV % PRED: 107 %
RV: 2.27 L
TLC % pred: 73 %
TLC: 3.36 L

## 2015-06-20 ENCOUNTER — Ambulatory Visit
Admission: RE | Admit: 2015-06-20 | Discharge: 2015-06-20 | Disposition: A | Discharge status: Home / Self Care | Payer: Medicare Other | Source: Ambulatory Visit | Attending: Nurse Practitioner | Admitting: Nurse Practitioner

## 2015-06-20 ENCOUNTER — Other Ambulatory Visit: Payer: Self-pay | Admitting: Nurse Practitioner

## 2015-06-20 DIAGNOSIS — R1084 Generalized abdominal pain: Secondary | ICD-10-CM

## 2015-06-20 DIAGNOSIS — R109 Unspecified abdominal pain: Secondary | ICD-10-CM | POA: Diagnosis not present

## 2015-06-20 DIAGNOSIS — K439 Ventral hernia without obstruction or gangrene: Secondary | ICD-10-CM | POA: Diagnosis not present

## 2015-07-08 DIAGNOSIS — Z23 Encounter for immunization: Secondary | ICD-10-CM | POA: Diagnosis not present

## 2015-07-08 DIAGNOSIS — R1012 Left upper quadrant pain: Secondary | ICD-10-CM | POA: Diagnosis not present

## 2015-07-08 DIAGNOSIS — K439 Ventral hernia without obstruction or gangrene: Secondary | ICD-10-CM | POA: Diagnosis not present

## 2015-09-22 DIAGNOSIS — R928 Other abnormal and inconclusive findings on diagnostic imaging of breast: Secondary | ICD-10-CM | POA: Diagnosis not present

## 2015-09-22 DIAGNOSIS — M549 Dorsalgia, unspecified: Secondary | ICD-10-CM | POA: Diagnosis not present

## 2015-09-22 DIAGNOSIS — J449 Chronic obstructive pulmonary disease, unspecified: Secondary | ICD-10-CM | POA: Diagnosis not present

## 2015-09-22 DIAGNOSIS — Z1389 Encounter for screening for other disorder: Secondary | ICD-10-CM | POA: Diagnosis not present

## 2015-09-22 DIAGNOSIS — M810 Age-related osteoporosis without current pathological fracture: Secondary | ICD-10-CM | POA: Diagnosis not present

## 2015-09-22 DIAGNOSIS — I10 Essential (primary) hypertension: Secondary | ICD-10-CM | POA: Diagnosis not present

## 2015-09-22 DIAGNOSIS — M109 Gout, unspecified: Secondary | ICD-10-CM | POA: Diagnosis not present

## 2015-09-22 DIAGNOSIS — Z0000 Encounter for general adult medical examination without abnormal findings: Secondary | ICD-10-CM | POA: Diagnosis not present

## 2015-10-24 DIAGNOSIS — M545 Low back pain: Secondary | ICD-10-CM | POA: Diagnosis not present

## 2015-12-01 DIAGNOSIS — M545 Low back pain: Secondary | ICD-10-CM | POA: Diagnosis not present

## 2015-12-06 ENCOUNTER — Other Ambulatory Visit: Payer: Self-pay | Admitting: Internal Medicine

## 2015-12-06 DIAGNOSIS — N6002 Solitary cyst of left breast: Secondary | ICD-10-CM

## 2015-12-14 DIAGNOSIS — M461 Sacroiliitis, not elsewhere classified: Secondary | ICD-10-CM | POA: Diagnosis not present

## 2015-12-26 ENCOUNTER — Ambulatory Visit
Admission: RE | Admit: 2015-12-26 | Discharge: 2015-12-26 | Disposition: A | Discharge status: Home / Self Care | Payer: Medicare Other | Source: Ambulatory Visit | Attending: Internal Medicine | Admitting: Internal Medicine

## 2015-12-26 DIAGNOSIS — R928 Other abnormal and inconclusive findings on diagnostic imaging of breast: Secondary | ICD-10-CM | POA: Diagnosis not present

## 2015-12-26 DIAGNOSIS — N6002 Solitary cyst of left breast: Secondary | ICD-10-CM

## 2016-01-12 DIAGNOSIS — H40013 Open angle with borderline findings, low risk, bilateral: Secondary | ICD-10-CM | POA: Diagnosis not present

## 2016-04-19 DIAGNOSIS — I10 Essential (primary) hypertension: Secondary | ICD-10-CM | POA: Diagnosis not present

## 2016-04-19 DIAGNOSIS — M545 Low back pain: Secondary | ICD-10-CM | POA: Diagnosis not present

## 2016-04-19 DIAGNOSIS — M810 Age-related osteoporosis without current pathological fracture: Secondary | ICD-10-CM | POA: Diagnosis not present

## 2016-04-20 DIAGNOSIS — Z23 Encounter for immunization: Secondary | ICD-10-CM | POA: Diagnosis not present

## 2016-05-24 ENCOUNTER — Ambulatory Visit (HOSPITAL_COMMUNITY)
Admission: EM | Admit: 2016-05-24 | Discharge: 2016-05-24 | Disposition: A | Discharge status: Home / Self Care | Payer: Medicare Other | Attending: Family Medicine | Admitting: Family Medicine

## 2016-05-24 ENCOUNTER — Encounter (HOSPITAL_COMMUNITY): Payer: Self-pay | Admitting: Emergency Medicine

## 2016-05-24 DIAGNOSIS — K5901 Slow transit constipation: Secondary | ICD-10-CM | POA: Diagnosis not present

## 2016-05-24 DIAGNOSIS — K5903 Drug induced constipation: Secondary | ICD-10-CM

## 2016-05-24 DIAGNOSIS — T402X5A Adverse effect of other opioids, initial encounter: Secondary | ICD-10-CM

## 2016-05-24 LAB — POCT URINALYSIS DIP (DEVICE)
Bilirubin Urine: NEGATIVE
Glucose, UA: NEGATIVE mg/dL
Hgb urine dipstick: NEGATIVE
KETONES UR: NEGATIVE mg/dL
Leukocytes, UA: NEGATIVE
Nitrite: NEGATIVE
Protein, ur: NEGATIVE mg/dL
Urobilinogen, UA: 0.2 mg/dL (ref 0.0–1.0)
pH: 6 (ref 5.0–8.0)

## 2016-05-24 MED ORDER — POLYETHYLENE GLYCOL 3350 17 GM/SCOOP PO POWD: ORAL | 0 refills | Status: DC

## 2016-05-24 NOTE — ED Provider Notes (Signed)
CSN: [id number]     Arrival date & time 05/24/16  1451 History   First MD Initiated Contact with Patient 05/24/16 1532     Chief Complaint  Patient presents with  . Constipation   (Consider location/radiation/quality/duration/timing/severity/associated sxs/prior Treatment) 75 year old female states that her bowels have been locked up for the past 2 days. Last bowel movement was about 2 days ago. He states that she has been taking tramadol for back pain recently she believes this may have been making it worse. She took to the colonics 2 nights ago and 3 took a lot the following night and self administer a Fleet's enema with little to no results.      Past Medical History:  Diagnosis Date  . Arthritis   . Gout   . Hypertension   . Kidney disease    Past Surgical History:  Procedure Laterality Date  . ABDOMINAL HYSTERECTOMY    . ABDOMINAL SURGERY    . KNEE SURGERY     History reviewed. No pertinent family history. Social History  Substance Use Topics  . Smoking status: Current Every Day Smoker  . Smokeless tobacco: Never Used  . Alcohol use No   OB History    No data available     Review of Systems  Constitutional: Positive for activity change. Negative for fever.  HENT: Negative.   Respiratory: Negative.   Cardiovascular: Negative for chest pain.  Gastrointestinal: Positive for abdominal pain and constipation. Negative for blood in stool, diarrhea, nausea and vomiting.  Genitourinary: Negative.   Skin: Negative.   All other systems reviewed and are negative.   Allergies  Hydromorphone hcl; Naproxen sodium; Sulfonamide derivatives; and Tramadol hcl  Home Medications   Prior to Admission medications   Medication Sig Start Date End Date Taking? Authorizing Provider  bisacodyl (DULCOLAX) 5 MG EC tablet Take 5 mg by mouth daily as needed for moderate constipation.   Yes Historical Provider, MD  Sodium Phosphates (ENEMA RE) Place rectally.   Yes Historical Provider,  MD  amLODipine (NORVASC) 5 MG tablet Take 5 mg by mouth daily.    Historical Provider, MD  Amlodipine Besy-Benazepril HCl (LOTREL PO) Take by mouth.    Historical Provider, MD  HYDROCODONE-ACETAMINOPHEN PO Take by mouth.    Historical Provider, MD  methylPREDNISolone (MEDROL DOSEPAK) 4 MG tablet 6 day dose pack.  Take as directed. 04/24/13   Lanae Crumbly, PA-C  polyethylene glycol powder (GLYCOLAX/MIRALAX) powder Place 1 capful in 6 oz liquid and drink. Repeat this twice over the next 2 hours. 05/24/16   Janne Napoleon, NP   Meds Ordered and Administered this Visit  Medications - No data to display  There were no vitals taken for this visit. No data found.   Physical Exam  Constitutional: She is oriented to person, place, and time. She appears well-developed and well-nourished. No distress.  HENT:  Head: Normocephalic and atraumatic.  Eyes: EOM are normal.  Neck: Normal range of motion. Neck supple.  Cardiovascular: Normal rate and normal heart sounds.   Pulmonary/Chest: Effort normal and breath sounds normal. No respiratory distress.  Abdominal: Soft. Bowel sounds are normal. She exhibits no mass. There is tenderness. There is no rebound and no guarding.  Abdomen obese, soft. Minor generalized tenderness. No rebound or guarding. Percusses tympanic across the upper most abdomen, dull to flat from the umbilicus down. I will sounds are active in all quadrants.  Musculoskeletal: She exhibits no edema.  Neurological: She is alert and oriented to person, place,  and time. She exhibits normal muscle tone.  Skin: Skin is warm and dry.  Psychiatric: She has a normal mood and affect.  Nursing note and vitals reviewed.   Urgent Care Course   Clinical Course    Procedures (including critical care time)  Labs Review Labs Reviewed  POCT URINALYSIS DIP (DEVICE)    Imaging Review No results found.   Visual Acuity Review  Right Eye Distance:   Left Eye Distance:   Bilateral Distance:     Right Eye Near:   Left Eye Near:    Bilateral Near:         MDM   1. Constipation by delayed colonic transit   2. Constipation due to opioid therapy    May need to repeat enema 4 to 6 hours after drinking the Miralax if not having good results. Increase fluid intake, Prune juice, fiber.  Meds ordered this encounter  Medications  . bisacodyl (DULCOLAX) 5 MG EC tablet    Sig: Take 5 mg by mouth daily as needed for moderate constipation.  . Sodium Phosphates (ENEMA RE)    Sig: Place rectally.  . polyethylene glycol powder (GLYCOLAX/MIRALAX) powder    Sig: Place 1 capful in 6 oz liquid and drink. Repeat this twice over the next 2 hours.    Dispense:  255 g    Refill:  0    Order Specific Question:   Supervising Provider    Answer:   Robyn Haber [[id number]]   Follow with your doctor. For worsening go to the ED    Janne Napoleon, NP 05/24/16 1555

## 2016-05-24 NOTE — ED Notes (Signed)
Patient received instructions and one script

## 2016-05-24 NOTE — Discharge Instructions (Signed)
May need to repeat enema 4 to 6 hours after drinking the Miralax if not having good results.

## 2016-05-24 NOTE — ED Triage Notes (Signed)
Patient reports issues with constipation for 2 days, more significant than usual.  Patient has tried dulcolax and enemas with little relief. Patient reports she is taking tramadol recently that is making a difference in bowel routine

## 2016-05-25 ENCOUNTER — Encounter (HOSPITAL_COMMUNITY): Payer: Self-pay | Admitting: *Deleted

## 2016-05-25 DIAGNOSIS — F17200 Nicotine dependence, unspecified, uncomplicated: Secondary | ICD-10-CM | POA: Insufficient documentation

## 2016-05-25 DIAGNOSIS — K760 Fatty (change of) liver, not elsewhere classified: Secondary | ICD-10-CM | POA: Diagnosis not present

## 2016-05-25 DIAGNOSIS — Z79899 Other long term (current) drug therapy: Secondary | ICD-10-CM | POA: Insufficient documentation

## 2016-05-25 DIAGNOSIS — K5901 Slow transit constipation: Secondary | ICD-10-CM | POA: Diagnosis not present

## 2016-05-25 DIAGNOSIS — K5900 Constipation, unspecified: Secondary | ICD-10-CM | POA: Diagnosis present

## 2016-05-25 DIAGNOSIS — I10 Essential (primary) hypertension: Secondary | ICD-10-CM | POA: Diagnosis not present

## 2016-05-25 LAB — URINALYSIS, ROUTINE W REFLEX MICROSCOPIC
Bilirubin Urine: NEGATIVE
GLUCOSE, UA: NEGATIVE mg/dL
Hgb urine dipstick: NEGATIVE
Ketones, ur: NEGATIVE mg/dL
LEUKOCYTES UA: NEGATIVE
Nitrite: NEGATIVE
PH: 5.5 (ref 5.0–8.0)
Protein, ur: NEGATIVE mg/dL
Specific Gravity, Urine: 1.031 — ABNORMAL HIGH (ref 1.005–1.030)

## 2016-05-25 LAB — COMPREHENSIVE METABOLIC PANEL
ALBUMIN: 4 g/dL (ref 3.5–5.0)
ALT: 27 U/L (ref 14–54)
ANION GAP: 8 (ref 5–15)
AST: 33 U/L (ref 15–41)
Alkaline Phosphatase: 73 U/L (ref 38–126)
BUN: 16 mg/dL (ref 6–20)
CO2: 25 mmol/L (ref 22–32)
Calcium: 9.3 mg/dL (ref 8.9–10.3)
Chloride: 108 mmol/L (ref 101–111)
Creatinine, Ser: 1.39 mg/dL — ABNORMAL HIGH (ref 0.44–1.00)
GFR calc Af Amer: 42 mL/min — ABNORMAL LOW (ref >60)
GFR calc non Af Amer: 36 mL/min — ABNORMAL LOW (ref >60)
GLUCOSE: 135 mg/dL — AB (ref 65–99)
POTASSIUM: 3.4 mmol/L — AB (ref 3.5–5.1)
SODIUM: 141 mmol/L (ref 135–145)
Total Bilirubin: 0.6 mg/dL (ref 0.3–1.2)
Total Protein: 6.7 g/dL (ref 6.5–8.1)

## 2016-05-25 LAB — CBC WITH DIFFERENTIAL/PLATELET
BASOS PCT: 0 %
Basophils Absolute: 0 10*3/uL (ref 0.0–0.1)
Eosinophils Absolute: 0.1 10*3/uL (ref 0.0–0.7)
Eosinophils Relative: 1 %
HCT: 44.6 % (ref 36.0–46.0)
HEMOGLOBIN: 14.1 g/dL (ref 12.0–15.0)
Lymphocytes Relative: 33 %
Lymphs Abs: 2.1 10*3/uL (ref 0.7–4.0)
MCH: 28.4 pg (ref 26.0–34.0)
MCHC: 31.6 g/dL (ref 30.0–36.0)
MCV: 89.7 fL (ref 78.0–100.0)
MONO ABS: 0.6 10*3/uL (ref 0.1–1.0)
MONOS PCT: 9 %
NEUTROS PCT: 57 %
Neutro Abs: 3.7 10*3/uL (ref 1.7–7.7)
Platelets: 177 10*3/uL (ref 150–400)
RBC: 4.97 MIL/uL (ref 3.87–5.11)
RDW: 13.5 % (ref 11.5–15.5)
WBC: 6.5 10*3/uL (ref 4.0–10.5)

## 2016-05-25 NOTE — ED Triage Notes (Signed)
Patient started on Tramadol on 7/13 and has been having trouble with her bowels since then.  Went to Urgent Care yesterday and was given Miralax and instructed to give herself an enema  Attempted to do the enema and started with back and leg muscle spasms

## 2016-05-26 ENCOUNTER — Emergency Department (HOSPITAL_COMMUNITY): Payer: Medicare Other

## 2016-05-26 ENCOUNTER — Emergency Department (HOSPITAL_COMMUNITY)
Admission: EM | Admit: 2016-05-26 | Discharge: 2016-05-26 | Disposition: A | Discharge status: Home / Self Care | Payer: Medicare Other | Attending: Emergency Medicine | Admitting: Emergency Medicine

## 2016-05-26 DIAGNOSIS — K760 Fatty (change of) liver, not elsewhere classified: Secondary | ICD-10-CM | POA: Diagnosis not present

## 2016-05-26 DIAGNOSIS — K5901 Slow transit constipation: Secondary | ICD-10-CM

## 2016-05-26 MED ORDER — MILK AND MOLASSES ENEMA
1.0000 | Freq: Once | RECTAL | Status: AC
  Administered 2016-05-26: 250 mL via RECTAL
  Filled 2016-05-26: qty 250

## 2016-05-26 MED ORDER — DIATRIZOATE MEGLUMINE & SODIUM 66-10 % PO SOLN
ORAL | Status: AC
  Filled 2016-05-26: qty 30

## 2016-05-26 MED ORDER — SENNOSIDES-DOCUSATE SODIUM 8.6-50 MG PO TABS: 1.0000 | ORAL_TABLET | Freq: Every day | ORAL | 0 refills | Status: DC

## 2016-05-26 NOTE — Discharge Instructions (Signed)
RECOMMEND MAGNESIUM CITRATE AT HOME FOR CONTINUED RELIEF ALTHOUGH THERE IS ONLY A MODERATE AMOUNT OF STOOL PRESENT IN THE COLON. RETURN TO THE EMERGENCY DEPARTMENT WITH ANY WORSENING SYMPTOMS - HIGH FEVER, SEVERE PAIN, BLOOD PER RECTUM, NEW CONCERN.

## 2016-05-26 NOTE — ED Notes (Signed)
To ct

## 2016-05-26 NOTE — ED Provider Notes (Signed)
[place] DEPT Provider Note   CSN: [id number] Arrival date & time: 05/25/16  2146     History   Chief Complaint Chief Complaint  Patient presents with  . Constipation    HPI Kristin Davidson is a 75 y.o. female.  Patient with a history of HTN, CKD presents with complaint of constipation with last bowel movement 3 days ago. She continues to have flatus but has not been able to have a bowel movement despite use of a Fleet enema and Miralax. No blood per rectum. No nausea or vomiting.    The history is provided by the patient and a relative. No language interpreter was used.  Constipation   This is a new problem. The current episode started more than 2 days ago. Associated symptoms include abdominal pain and flatus. She does not exercise regularly.    Past Medical History:  Diagnosis Date  . Arthritis   . Gout   . Hypertension   . Kidney disease     Patient Active Problem List   Diagnosis Date Noted  . 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 HYPERTENSION 08/06/2007  . ALLERGIC RHINITIS 08/06/2007  . GERD 08/06/2007  . BREAST CYST 08/06/2007  . 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 ABDOMINAL HYSTERECTOMY    . ABDOMINAL SURGERY    . KNEE SURGERY      OB History    No data available       Home Medications    Prior to Admission medications   Medication Sig Start Date End Date Taking? Authorizing Provider  amLODipine (NORVASC) 5 MG tablet Take 5 mg by mouth daily.    Historical Provider, MD  Amlodipine Besy-Benazepril HCl (LOTREL PO) Take by mouth.    Historical Provider, MD  bisacodyl (DULCOLAX) 5 MG EC tablet Take 5 mg by mouth daily as needed for moderate constipation.    Historical Provider, MD  HYDROCODONE-ACETAMINOPHEN PO Take by mouth.    Historical Provider, MD  methylPREDNISolone (MEDROL DOSEPAK) 4 MG tablet 6 day dose pack.  Take as directed. 04/24/13   Lanae Crumbly, PA-C  polyethylene glycol  powder (GLYCOLAX/MIRALAX) powder Place 1 capful in 6 oz liquid and drink. Repeat this twice over the next 2 hours. 05/24/16   Janne Napoleon, NP  Sodium Phosphates (ENEMA RE) Place rectally.    Historical Provider, MD    Family History No family history on file.  Social History Social History  Substance Use Topics  . Smoking status: Current Every Day Smoker  . Smokeless tobacco: Never Used  . Alcohol use No     Allergies   Hydromorphone hcl; Naproxen sodium; Sulfonamide derivatives; and Tramadol hcl   Review of Systems Review of Systems  Constitutional: Negative for chills and fever.  Respiratory: Negative.   Cardiovascular: Negative.   Gastrointestinal: Positive for abdominal pain, constipation and flatus. Negative for vomiting.  Genitourinary: Negative.   Musculoskeletal: Negative.   Neurological: Negative.      Physical Exam Updated Vital Signs BP 157/82 (BP Location: Right Arm)   Pulse 74   Temp 98.6 F (37 C) (Oral)   Resp 20   Ht 5\' 1"  (1.549 m)   Wt 70.8 kg   SpO2 97%   BMI 29.48 kg/m   Physical Exam  Constitutional: She appears well-developed and well-nourished.  HENT:  Head: Normocephalic.  Neck: Normal range of motion. Neck supple.  Cardiovascular: Normal rate and regular rhythm.   Pulmonary/Chest: Effort normal and  breath sounds normal.  Abdominal: Soft. Bowel sounds are normal. There is no tenderness. There is no rebound and no guarding.  Musculoskeletal: Normal range of motion.  Neurological: She is alert. No cranial nerve deficit.  Skin: Skin is warm and dry. No rash noted.  Psychiatric: She has a normal mood and affect.     ED Treatments / Results  Labs (all labs ordered are listed, but only abnormal results are displayed) Labs Reviewed  COMPREHENSIVE METABOLIC PANEL - Abnormal; Notable for the following:       Result Value   Potassium 3.4 (*)    Glucose, Bld 135 (*)    Creatinine, Ser 1.39 (*)    GFR calc non Af Amer 36 (*)    GFR calc  Af Amer 42 (*)    All other components within normal limits  URINALYSIS, ROUTINE W REFLEX MICROSCOPIC (NOT AT [facility]) - Abnormal; Notable for the following:    Specific Gravity, Urine 1.031 (*)    All other components within normal limits  CBC WITH DIFFERENTIAL/PLATELET   Results for orders placed or performed during the hospital encounter of 05/26/16  CBC with Differential  Result Value Ref Range   WBC 6.5 4.0 - 10.5 K/uL   RBC 4.97 3.87 - 5.11 MIL/uL   Hemoglobin 14.1 12.0 - 15.0 g/dL   HCT 44.6 36.0 - 46.0 %   MCV 89.7 78.0 - 100.0 fL   MCH 28.4 26.0 - 34.0 pg   MCHC 31.6 30.0 - 36.0 g/dL   RDW 13.5 11.5 - 15.5 %   Platelets 177 150 - 400 K/uL   Neutrophils Relative % 57 %   Neutro Abs 3.7 1.7 - 7.7 K/uL   Lymphocytes Relative 33 %   Lymphs Abs 2.1 0.7 - 4.0 K/uL   Monocytes Relative 9 %   Monocytes Absolute 0.6 0.1 - 1.0 K/uL   Eosinophils Relative 1 %   Eosinophils Absolute 0.1 0.0 - 0.7 K/uL   Basophils Relative 0 %   Basophils Absolute 0.0 0.0 - 0.1 K/uL  Comprehensive metabolic panel  Result Value Ref Range   Sodium 141 135 - 145 mmol/L   Potassium 3.4 (L) 3.5 - 5.1 mmol/L   Chloride 108 101 - 111 mmol/L   CO2 25 22 - 32 mmol/L   Glucose, Bld 135 (H) 65 - 99 mg/dL   BUN 16 6 - 20 mg/dL   Creatinine, Ser 1.39 (H) 0.44 - 1.00 mg/dL   Calcium 9.3 8.9 - 10.3 mg/dL   Total Protein 6.7 6.5 - 8.1 g/dL   Albumin 4.0 3.5 - 5.0 g/dL   AST 33 15 - 41 U/L   ALT 27 14 - 54 U/L   Alkaline Phosphatase 73 38 - 126 U/L   Total Bilirubin 0.6 0.3 - 1.2 mg/dL   GFR calc non Af Amer 36 (L) >60 mL/min   GFR calc Af Amer 42 (L) >60 mL/min   Anion gap 8 5 - 15  Urinalysis, Routine w reflex microscopic (not at [facility])  Result Value Ref Range   Color, Urine YELLOW YELLOW   APPearance CLEAR CLEAR   Specific Gravity, Urine 1.031 (H) 1.005 - 1.030   pH 5.5 5.0 - 8.0   Glucose, UA NEGATIVE NEGATIVE mg/dL   Hgb urine dipstick NEGATIVE NEGATIVE   Bilirubin Urine NEGATIVE NEGATIVE    Ketones, ur NEGATIVE NEGATIVE mg/dL   Protein, ur NEGATIVE NEGATIVE mg/dL   Nitrite NEGATIVE NEGATIVE   Leukocytes, UA NEGATIVE NEGATIVE   Ct  Abdomen Pelvis Wo Contrast  Result Date: 05/26/2016 CLINICAL DATA:  Constipation EXAM: CT ABDOMEN AND PELVIS WITHOUT CONTRAST TECHNIQUE: Multidetector CT imaging of the abdomen and pelvis was performed following the standard protocol without IV contrast. COMPARISON:  Abdominal radiograph 06/20/2015 FINDINGS: Lower chest: No pulmonary nodules or pleural effusion. No visible pericardial effusion. Hepatobiliary: There is a heterogeneous hepatic attenuation pattern which may indicate areas of fatty infiltration. There is focal fatty sparing along the gallbladder fossa. No cholelithiasis identified. No biliary dilatation. Pancreas: Pancreatic contours are normal. No abnormal calcifications or mass lesions. No peripancreatic fluid collection. No pancreatic ductal dilatation. Spleen: Normal Adrenals/Urinary Tract: The adrenal glands are normal. The unenhanced appearances of the kidneys are normal. No hydronephrosis or perinephric stranding. No nephroureterolithiasis. Stomach/Bowel: No dilated small bowel or other evidence of obstruction. No enteric or colonic inflammation. The appendix is not clearly visualized but there is no free fluid or inflammatory stranding within the right lower quadrant. No intra-abdominal fluid collection. Vascular/Lymphatic: There is atherosclerotic calcification within the abdominal aorta and its branches. No abdominal aortic aneurysm. No retroperitoneal, mesenteric or pelvic adenopathy. Reproductive: The uterus is surgically absent. Ovaries are not identified. No free fluid in the pelvis. Other: There are calcifications within the posterior subcutaneous fat, likely injection granulomata. Musculoskeletal: Bilateral sacroiliac sclerosis. Multilevel facet hypertrophy the lumbar spine. No lytic or blastic osseous lesions. IMPRESSION: 1. No acute  abdominal or pelvic process. 2. Normal amount of colonic stool. 3. Hepatic steatosis. 4. Aortic atherosclerosis. Electronically Signed   By: Ulyses Jarred M.D.   On: 05/26/2016 05:06    EKG  EKG Interpretation None       Radiology No results found.  Procedures Procedures (including critical care time)  Medications Ordered in ED Medications - No data to display   Initial Impression / Assessment and Plan / ED Course  I have reviewed the triage vital signs and the nursing notes.  Pertinent labs & imaging results that were available during my care of the patient were reviewed by me and considered in my medical decision making (see chart for details).  Clinical Course    Patient presents for presumed constipation with no bowel movement in 3 days. No blood per rectum. Second urgent visit in 2 days for same. No fever.  Labs largely unremarkable. CT scan without obstruction commenting on "normal amount of stool". Milk and molasses enema provided, although, the patient would only tolerate a small portion. She is feeling the need to have a BM.  Feel, at this point, she can be discharged home with PCP follow up. Will recommend Mag Citrate. Return precautions discussed.   Final Clinical Impressions(s) / ED Diagnoses   Final diagnoses:  None    New Prescriptions New Prescriptions   No medications on file     Charlann Lange, Hershal Coria 05/26/16 [id number]    Isla Pence, MD 05/26/16 [phone]

## 2016-05-31 DIAGNOSIS — K5903 Drug induced constipation: Secondary | ICD-10-CM | POA: Diagnosis not present

## 2016-05-31 DIAGNOSIS — R55 Syncope and collapse: Secondary | ICD-10-CM | POA: Diagnosis not present

## 2016-07-25 DIAGNOSIS — Z23 Encounter for immunization: Secondary | ICD-10-CM | POA: Diagnosis not present

## 2016-08-03 ENCOUNTER — Ambulatory Visit
Admission: RE | Admit: 2016-08-03 | Discharge: 2016-08-03 | Disposition: A | Discharge status: Home / Self Care | Payer: Medicare Other | Source: Ambulatory Visit | Attending: Internal Medicine | Admitting: Internal Medicine

## 2016-08-03 ENCOUNTER — Other Ambulatory Visit: Payer: Self-pay | Admitting: Internal Medicine

## 2016-08-03 DIAGNOSIS — K5900 Constipation, unspecified: Secondary | ICD-10-CM

## 2016-09-28 DIAGNOSIS — R109 Unspecified abdominal pain: Secondary | ICD-10-CM | POA: Diagnosis not present

## 2016-09-28 DIAGNOSIS — Z0000 Encounter for general adult medical examination without abnormal findings: Secondary | ICD-10-CM | POA: Diagnosis not present

## 2016-09-28 DIAGNOSIS — Z1389 Encounter for screening for other disorder: Secondary | ICD-10-CM | POA: Diagnosis not present

## 2016-09-28 DIAGNOSIS — M810 Age-related osteoporosis without current pathological fracture: Secondary | ICD-10-CM | POA: Diagnosis not present

## 2016-09-28 DIAGNOSIS — M109 Gout, unspecified: Secondary | ICD-10-CM | POA: Diagnosis not present

## 2016-09-28 DIAGNOSIS — E663 Overweight: Secondary | ICD-10-CM | POA: Diagnosis not present

## 2016-09-28 DIAGNOSIS — K5900 Constipation, unspecified: Secondary | ICD-10-CM | POA: Diagnosis not present

## 2016-09-28 DIAGNOSIS — E7800 Pure hypercholesterolemia, unspecified: Secondary | ICD-10-CM | POA: Diagnosis not present

## 2016-10-03 ENCOUNTER — Encounter: Payer: Self-pay | Admitting: Physician Assistant

## 2016-10-05 DIAGNOSIS — E7800 Pure hypercholesterolemia, unspecified: Secondary | ICD-10-CM | POA: Diagnosis not present

## 2016-10-05 DIAGNOSIS — R109 Unspecified abdominal pain: Secondary | ICD-10-CM | POA: Diagnosis not present

## 2016-10-16 ENCOUNTER — Encounter: Payer: Self-pay | Admitting: Physician Assistant

## 2016-10-16 ENCOUNTER — Ambulatory Visit (INDEPENDENT_AMBULATORY_CARE_PROVIDER_SITE_OTHER): Payer: Medicare Other | Admitting: Physician Assistant

## 2016-10-16 VITALS — BP 144/74 | HR 72 | Ht 61.0 in | Wt 162.0 lb

## 2016-10-16 DIAGNOSIS — R1012 Left upper quadrant pain: Secondary | ICD-10-CM | POA: Diagnosis not present

## 2016-10-16 DIAGNOSIS — K5900 Constipation, unspecified: Secondary | ICD-10-CM | POA: Diagnosis not present

## 2016-10-16 MED ORDER — LINACLOTIDE 145 MCG PO CAPS: 145.0000 ug | ORAL_CAPSULE | Freq: Every day | ORAL | 5 refills | Status: DC

## 2016-10-16 NOTE — Patient Instructions (Signed)
We have sent the following medications to your pharmacy for you to pick up at your convenience: linzess 145 mcg daily.   We have given you a high fiber diet handout. Please strive to have 25-30 grams of fiber daily.   Your provider suggest that you drink more water. Try to have at least 6-8 8 oz glasses of water daily. Try to exercise 15-20 minutes.

## 2016-10-16 NOTE — Progress Notes (Signed)
Chief Complaint: Constipation  HPI:  Kristin Davidson is a 76 year old African-American female with a past medical history of gout, hypertension and kidney disease,  who was referred to me by [name], MD for a complaint of constipation. She was a previous patient of Dr. Buel Ream.   Last colonoscopy was performed on 07/11/09 and showed diverticula scattered in the sigmoid colon, no polyps or cancers and otherwise normal exam. Repeat was recommended in 10 years.   Review of referring physician's notes shows the patient was seen on 12/22/17and at that time described continued constipation and was referred to our office. Patient had previous imaging of her abdomen on 08/03/2016 which showed no acute findings and no evidence of bowel obstruction or generalized adynamic ileus, no free air or increased stool. Most recent labs we have in the computer are from 4 months ago with a potassium mildly low at 3.4 and otherwise normal. Patient tells me she has had some drawn since then. We do not have record.   Today, the patient tells me that back in March she got a "injection in my back", this helped with her back pain for 2 months but then she started with what sounds like sciatica down her left leg, she went to her PCP who started on tramadol which she took for 3 weeks and after that time, she started with constipation. This was around June. Since that time the patient has been using a mixture of MiraLAX and prune juice in order to have bowel movements typically every other day. The patient was seen in the ER initially on 05/26/2016 for these symptoms, with normal imaging. She tells me today that she uses either MiraLAX or prune juice every day and will have a bowel movement typically every other day. If she does not use this medication she becomes constipated. Patient also describes that she feels like she has to "massage her left upper quadrant", and she feels like "something moves", and then she will have a  bowel movement. At some point someone told her she could possibly have a hernia in this area and she feels like this could be possible because she feels something "pop back in". Patient tells me she has used MiraLAX even multiple times a day, but this has not helped her in the past. She does continue with straining for a bowel movement regardless of using MiraLAX or prune juice. Patient does admit that when she eats a high-fiber diet she tends to do better. She tells me she drinks at least 6-8 8 ounce glasses of water per day.   Patient denies fever, chills, blood in her stool, melena, weight loss, fatigue and anorexia, nausea, vomiting, heartburn, reflux or symptoms that awaken her at night.    Past Medical History:  Diagnosis Date  . Arthritis   . Gout   . Hypertension   . Kidney disease     Past Surgical History:  Procedure Laterality Date  . ABDOMINAL HYSTERECTOMY    . ABDOMINAL SURGERY    . KNEE SURGERY      Current Outpatient Prescriptions  Medication Sig Dispense Refill  . allopurinol (ZYLOPRIM) 300 MG tablet Take 150-300 mg by mouth daily. Alternate from 1/2 and 1 tablet daily  0  . amLODipine (NORVASC) 5 MG tablet Take 5 mg by mouth daily.    . benazepril (LOTENSIN) 10 MG tablet Take 10 mg by mouth daily.  0  . polyethylene glycol powder (GLYCOLAX/MIRALAX) powder Place 1 capful in 6 oz liquid and  drink. Repeat this twice over the next 2 hours. (Patient taking differently: One capful daily in 8 oz of water) 255 g 0  . linaclotide (LINZESS) 145 MCG CAPS capsule Take 1 capsule (145 mcg total) by mouth daily before breakfast. 30 capsule 5   No current facility-administered medications for this visit.     Allergies as of 10/16/2016 - Review Complete 10/16/2016  Allergen Reaction Noted  . Hydromorphone hcl  06/10/2009  . Naproxen sodium    . Sulfonamide derivatives    . Tramadol hcl Other (See Comments)     History reviewed. No pertinent family history.  Social History    Social History  . Marital status: Widowed    Spouse name: N/A  . Number of children: N/A  . Years of education: N/A   Occupational History  . Not on file.   Social History Main Topics  . Smoking status: Current Every Day Smoker  . Smokeless tobacco: Never Used  . Alcohol use No  . Drug use: No  . Sexual activity: Not on file   Other Topics Concern  . Not on file   Social History Narrative  . No narrative on file    Review of Systems:     Constitutional: No weight loss, fever or chills HEENT: Eyes: No change in vision               Ears, Nose, Throat:  No change in hearing Skin: No rash  Cardiovascular: No chest pain Respiratory: No SOB  Gastrointestinal: See HPI and otherwise negative Neurological: No headache, dizziness or syncope Musculoskeletal: No new muscle or joint pain Hematologic: No bleeding  Psychiatric: No history of depression or anxiety   Physical Exam:  Vital signs: BP (!) 144/74   Pulse 72   Ht 5\' 1"  (1.549 m)   Wt 162 lb (73.5 kg)   BMI 30.61 kg/m   Constitutional:   Pleasant Elderly African-American female appears to be in NAD, Well developed, Well nourished, alert and cooperative Head:  Normocephalic and atraumatic. Eyes:   PEERL, EOMI. No icterus. Conjunctiva pink. Ears:  Normal auditory acuity. Neck:  Supple Throat: Oral cavity and pharynx without inflammation, swelling or lesion.  Respiratory: Respirations even and unlabored. Lungs clear to auscultation bilaterally.   No wheezes, crackles, or rhonchi.  Cardiovascular: Normal S1, S2. No MRG. Regular rate and rhythm. No peripheral edema, cyanosis or pallor.  Gastrointestinal:  Soft, nondistended, left upper quadrant tenderness on exam. No rebound or guarding. Normal bowel sounds. No appreciable masses or hepatomegaly. Rectal:  Not performed.  Msk:  Symmetrical without gross deformities. Without edema, no deformity or joint abnormality.  Neurologic:  Alert and  oriented x4;  grossly normal  neurologically.  Skin:   Dry and intact without significant lesions or rashes. Psychiatric:  Demonstrates good judgement and reason without abnormal affect or behaviors.  RELEVANT LABS AND IMAGING: CBC    Component Value Date/Time   WBC 6.5 05/25/2016 2225   RBC 4.97 05/25/2016 2225   HGB 14.1 05/25/2016 2225   HCT 44.6 05/25/2016 2225   PLT 177 05/25/2016 2225   MCV 89.7 05/25/2016 2225   MCH 28.4 05/25/2016 2225   MCHC 31.6 05/25/2016 2225   RDW 13.5 05/25/2016 2225   LYMPHSABS 2.1 05/25/2016 2225   MONOABS 0.6 05/25/2016 2225   EOSABS 0.1 05/25/2016 2225   BASOSABS 0.0 05/25/2016 2225    CMP     Component Value Date/Time   NA 141 05/25/2016 2225   K 3.4 (  L) 05/25/2016 2225   CL 108 05/25/2016 2225   CO2 25 05/25/2016 2225   GLUCOSE 135 (H) 05/25/2016 2225   BUN 16 05/25/2016 2225   CREATININE 1.39 (H) 05/25/2016 2225   CALCIUM 9.3 05/25/2016 2225   PROT 6.7 05/25/2016 2225   ALBUMIN 4.0 05/25/2016 2225   AST 33 05/25/2016 2225   ALT 27 05/25/2016 2225   ALKPHOS 73 05/25/2016 2225   BILITOT 0.6 05/25/2016 2225   GFRNONAA 36 (L) 05/25/2016 2225   GFRAA 42 (L) 05/25/2016 2225    Assessment: 1. Constipation: Patient describes that she cannot have a bowel movement without the help of MiraLAX and/or prune juice and even this leaves her straining on the toilet, thishas been occurring over the past 6-7 months, ever since being on tramadol for 3 weeks for sciatica, patient would like to try something else that is more dependable; most likely this represents slow transit constipation as patient has had multiple imaging studies which were normal 2. LUQ pain: at time of exam today, to only light palpation, question whether patient has caused some bruising with her "deep rubbing" in this area for BM  Plan: 1. Prescribed Linzess 145 mcg daily #30 with 5 refills, provided patient with samples 2. Recommend the patient maintain a high-fiber diet, provided her handout, at least  25-35 g per day 3. Recommend the patient continue to exercise and drink at least 6-8 8 ounce glasses of water on a daily basis 4. Requested patient's most recent labs from her PCP 5. Patient to follow in clinic in 2-3 weeks with myself or Dr. Havery [name] as she was a previously a [facility] patient, if no change could consider colonoscopy for further eval versus other medication  Ellouise Newer, PA-C [facility] 10/16/2016, 2:52 PM  Cc: [name], MD

## 2016-10-17 NOTE — Progress Notes (Signed)
Agree with assessment and plan as outlined.  

## 2016-12-13 DIAGNOSIS — H40013 Open angle with borderline findings, low risk, bilateral: Secondary | ICD-10-CM | POA: Diagnosis not present

## 2016-12-24 ENCOUNTER — Other Ambulatory Visit: Payer: Self-pay | Admitting: Family Medicine

## 2016-12-24 ENCOUNTER — Other Ambulatory Visit: Payer: Self-pay | Admitting: Internal Medicine

## 2016-12-24 DIAGNOSIS — N6310 Unspecified lump in the right breast, unspecified quadrant: Secondary | ICD-10-CM

## 2016-12-24 DIAGNOSIS — Z1231 Encounter for screening mammogram for malignant neoplasm of breast: Secondary | ICD-10-CM

## 2016-12-27 ENCOUNTER — Ambulatory Visit
Admission: RE | Admit: 2016-12-27 | Discharge: 2016-12-27 | Disposition: A | Discharge status: Home / Self Care | Payer: Medicare Other | Source: Ambulatory Visit | Attending: Internal Medicine | Admitting: Internal Medicine

## 2016-12-27 DIAGNOSIS — R922 Inconclusive mammogram: Secondary | ICD-10-CM | POA: Diagnosis not present

## 2016-12-27 DIAGNOSIS — N6310 Unspecified lump in the right breast, unspecified quadrant: Secondary | ICD-10-CM

## 2017-01-08 DIAGNOSIS — H9190 Unspecified hearing loss, unspecified ear: Secondary | ICD-10-CM | POA: Diagnosis not present

## 2017-01-08 DIAGNOSIS — R350 Frequency of micturition: Secondary | ICD-10-CM | POA: Diagnosis not present

## 2017-01-08 DIAGNOSIS — J209 Acute bronchitis, unspecified: Secondary | ICD-10-CM | POA: Diagnosis not present

## 2017-01-11 DIAGNOSIS — R350 Frequency of micturition: Secondary | ICD-10-CM | POA: Diagnosis not present

## 2017-01-23 DIAGNOSIS — H903 Sensorineural hearing loss, bilateral: Secondary | ICD-10-CM | POA: Diagnosis not present

## 2017-04-17 DIAGNOSIS — S90862A Insect bite (nonvenomous), left foot, initial encounter: Secondary | ICD-10-CM | POA: Diagnosis not present

## 2017-04-17 DIAGNOSIS — W57XXXA Bitten or stung by nonvenomous insect and other nonvenomous arthropods, initial encounter: Secondary | ICD-10-CM | POA: Diagnosis not present

## 2017-07-11 DIAGNOSIS — Z83511 Family history of glaucoma: Secondary | ICD-10-CM | POA: Diagnosis not present

## 2017-07-11 DIAGNOSIS — H40013 Open angle with borderline findings, low risk, bilateral: Secondary | ICD-10-CM | POA: Diagnosis not present

## 2017-08-21 DIAGNOSIS — Z23 Encounter for immunization: Secondary | ICD-10-CM | POA: Diagnosis not present

## 2017-08-21 DIAGNOSIS — R739 Hyperglycemia, unspecified: Secondary | ICD-10-CM | POA: Diagnosis not present

## 2017-08-21 DIAGNOSIS — R358 Other polyuria: Secondary | ICD-10-CM | POA: Diagnosis not present

## 2017-08-21 DIAGNOSIS — M545 Low back pain: Secondary | ICD-10-CM | POA: Diagnosis not present

## 2017-08-21 DIAGNOSIS — R631 Polydipsia: Secondary | ICD-10-CM | POA: Diagnosis not present

## 2017-09-03 ENCOUNTER — Ambulatory Visit (INDEPENDENT_AMBULATORY_CARE_PROVIDER_SITE_OTHER): Payer: Self-pay | Admitting: Orthopaedic Surgery

## 2017-09-17 ENCOUNTER — Ambulatory Visit: Payer: Medicare Other | Admitting: Dietician

## 2017-10-14 ENCOUNTER — Ambulatory Visit: Payer: Medicare Other | Admitting: Dietician

## 2017-10-18 ENCOUNTER — Encounter: Payer: Medicare Other | Attending: Internal Medicine | Admitting: *Deleted

## 2017-10-18 DIAGNOSIS — E109 Type 1 diabetes mellitus without complications: Secondary | ICD-10-CM | POA: Insufficient documentation

## 2017-10-18 DIAGNOSIS — E119 Type 2 diabetes mellitus without complications: Secondary | ICD-10-CM

## 2017-10-18 DIAGNOSIS — Z713 Dietary counseling and surveillance: Secondary | ICD-10-CM | POA: Insufficient documentation

## 2017-10-18 NOTE — Patient Instructions (Signed)
Plan:  We identified that Starch, Fruit and Milk contain carbohydrate and it is helpful to spread those types of foods evenly throughout the day with meals and snacks. Continue with your activity level daily as tolerated, but consider sitting down every hour or so, and take a little break

## 2017-10-22 NOTE — Progress Notes (Signed)
Diabetes Self-Management Education  Visit Type: First/Initial  Appt. Start Time: 0800 Appt. End Time: 0930  10/22/2017  Ms. Kristin Davidson, identified by name and date of birth, is a 77 y.o. female with a diagnosis of Diabetes: Type 2. Patient here with her grown daughter, who she is living with now to assist the daughter who is ill. They both participated in the viist. Patient states she is active keeping their home clean and preparing some of the meals.   ASSESSMENT  Height 5\' 1"  (1.549 m), weight 166 lb 11.2 oz (75.6 kg). Body mass index is 31.5 kg/m.  Diabetes Self-Management Education - 10/18/17 0807      Visit Information   Visit Type  First/Initial      Initial Visit   Diabetes Type  Type 2    Are you currently following a meal plan?  No    Are you taking your medications as prescribed?  Not on Medications    Date Diagnosed  08/21/2017      Health Coping   How would you rate your overall health?  Good      Psychosocial Assessment   Patient Belief/Attitude about Diabetes  Motivated to manage diabetes    Other persons present  Patient    Patient Concerns  Nutrition/Meal planning    Learning Readiness  Change in progress    How often do you need to have someone help you when you read instructions, pamphlets, or other written materials from your doctor or pharmacy?  4 - Often    What is the last grade level you completed in school?  12      Pre-Education Assessment   Patient understands the diabetes disease and treatment process.  Needs Instruction    Patient understands incorporating nutritional management into lifestyle.  Needs Instruction    Patient undertands incorporating physical activity into lifestyle.  Needs Instruction    Patient understands using medications safely.  Needs Instruction    Patient understands monitoring blood glucose, interpreting and using results  Needs Instruction    Patient understands prevention, detection, and treatment of acute  complications.  Needs Instruction    Patient understands prevention, detection, and treatment of chronic complications.  Needs Instruction    Patient understands how to develop strategies to address psychosocial issues.  Needs Instruction      Complications   Last HgB A1C per patient/outside source  6.7 %    How often do you check your blood sugar?  Not recommended by provider    Have you had a dilated eye exam in the past 12 months?  Yes    Have you had a dental exam in the past 12 months?  No    Are you checking your feet?  Yes    How many days per week are you checking your feet?  5      Dietary Intake   Breakfast  sausage, eggs, 1 cup grits, 1 toast,     Lunch  skips 3 days a week, sandwich with applesauce     Dinner  cooks at home usually, meat, starch, vegetable, 1 slice bread    Beverage(s)  8 oz OJ, coffee with 3 tsp sugar and cream, water, crystal light      Exercise   Exercise Type  ADL's      Patient Education   Previous Diabetes Education  No    Disease state   Definition of diabetes, type 1 and 2, and the diagnosis of diabetes  Nutrition management   Role of diet in the treatment of diabetes and the relationship between the three main macronutrients and blood glucose level;Food label reading, portion sizes and measuring food.;Carbohydrate counting    Physical activity and exercise   Role of exercise on diabetes management, blood pressure control and cardiac health.    Monitoring  Purpose and frequency of SMBG.;Identified appropriate SMBG and/or A1C goals.    Psychosocial adjustment  Worked with patient to identify barriers to care and solutions      Individualized Goals (developed by patient)   Nutrition  Follow meal plan discussed;General guidelines for healthy choices and portions discussed    Physical Activity  15 minutes per day    Medications  Not Applicable    Monitoring   Other (comment) discussed advantages of having meter available to test as needed.       Post-Education Assessment   Patient understands the diabetes disease and treatment process.  Demonstrates understanding / competency    Patient understands incorporating nutritional management into lifestyle.  Demonstrates understanding / competency    Patient undertands incorporating physical activity into lifestyle.  Demonstrates understanding / competency    Patient understands monitoring blood glucose, interpreting and using results  Demonstrates understanding / competency    Patient understands how to develop strategies to address psychosocial issues.  Demonstrates understanding / competency      Outcomes   Expected Outcomes  Demonstrated interest in learning. Expect positive outcomes    Future DMSE  PRN    Program Status  Completed       Individualized Plan for Diabetes Self-Management Training:   Learning Objective:  Patient will have a greater understanding of diabetes self-management. Patient education plan is to attend individual and/or group sessions per assessed needs and concerns.   Plan:   Patient Instructions  Plan:  We identified that Starch, Fruit and Milk contain carbohydrate and it is helpful to spread those types of foods evenly throughout the day with meals and snacks. Continue with your activity level daily as tolerated, but consider sitting down every hour or so, and take a little break  Expected Outcomes:  Demonstrated interest in learning. Expect positive outcomes  Education material provided: Living Well with Diabetes, A1C conversion sheet, Meal plan card and Carbohydrate counting sheet  If problems or questions, patient to contact team via:  Phone  Future DSME appointment: PRN

## 2017-10-22 NOTE — Progress Notes (Signed)
Diabetes Self-Management Education  Visit Type: First/Initial  Appt. Start Time: 0800 Appt. End Time: 0930  10/22/2017  Kristin Davidson, identified by name and date of birth, is a 77 y.o. female with a diagnosis of Diabetes: Type 2.  She is here with her daughter who she lives with to help her out while she is ill. Both participated in the visit. Patient does majority of cooking and cleaning of the home at this time. She states she does not have a meter yet, diet history obtained.   ASSESSMENT  Height 5\' 1"  (1.549 m), weight 166 lb 11.2 oz (75.6 kg). Body mass index is 31.5 kg/m.  Diabetes Self-Management Education - 10/18/17 0807      Visit Information   Visit Type  First/Initial      Initial Visit   Diabetes Type  Type 2    Are you currently following a meal plan?  No    Are you taking your medications as prescribed?  Not on Medications    Date Diagnosed  08/21/2017      Health Coping   How would you rate your overall health?  Good      Psychosocial Assessment   Patient Belief/Attitude about Diabetes  Motivated to manage diabetes    Other persons present  Patient    Patient Concerns  Nutrition/Meal planning    Learning Readiness  Change in progress    How often do you need to have someone help you when you read instructions, pamphlets, or other written materials from your doctor or pharmacy?  4 - Often    What is the last grade level you completed in school?  12      Pre-Education Assessment   Patient understands the diabetes disease and treatment process.  Needs Instruction    Patient understands incorporating nutritional management into lifestyle.  Needs Instruction    Patient undertands incorporating physical activity into lifestyle.  Needs Instruction    Patient understands using medications safely.  Needs Instruction    Patient understands monitoring blood glucose, interpreting and using results  Needs Instruction    Patient understands prevention, detection, and  treatment of acute complications.  Needs Instruction    Patient understands prevention, detection, and treatment of chronic complications.  Needs Instruction    Patient understands how to develop strategies to address psychosocial issues.  Needs Instruction      Complications   Last HgB A1C per patient/outside source  6.7 %    How often do you check your blood sugar?  Not recommended by provider    Have you had a dilated eye exam in the past 12 months?  Yes    Have you had a dental exam in the past 12 months?  No    Are you checking your feet?  Yes    How many days per week are you checking your feet?  5      Dietary Intake   Breakfast  sausage, eggs, 1 cup grits, 1 toast,     Lunch  skips 3 days a week, sandwich with applesauce     Dinner  cooks at home usually, meat, starch, vegetable, 1 slice bread    Beverage(s)  8 oz OJ, coffee with 3 tsp sugar and cream, water, crystal light      Exercise   Exercise Type  ADL's      Patient Education   Previous Diabetes Education  No    Disease state   Definition of diabetes, type 1  and 2, and the diagnosis of diabetes    Nutrition management   Role of diet in the treatment of diabetes and the relationship between the three main macronutrients and blood glucose level;Food label reading, portion sizes and measuring food.;Carbohydrate counting    Physical activity and exercise   Role of exercise on diabetes management, blood pressure control and cardiac health.    Monitoring  Purpose and frequency of SMBG.;Identified appropriate SMBG and/or A1C goals.    Psychosocial adjustment  Worked with patient to identify barriers to care and solutions      Individualized Goals (developed by patient)   Nutrition  Follow meal plan discussed;General guidelines for healthy choices and portions discussed    Physical Activity  15 minutes per day    Medications  Not Applicable    Monitoring   Other (comment) discussed advantages of having meter available to test  as needed.      Post-Education Assessment   Patient understands the diabetes disease and treatment process.  Demonstrates understanding / competency    Patient understands incorporating nutritional management into lifestyle.  Demonstrates understanding / competency    Patient undertands incorporating physical activity into lifestyle.  Demonstrates understanding / competency    Patient understands monitoring blood glucose, interpreting and using results  Demonstrates understanding / competency    Patient understands how to develop strategies to address psychosocial issues.  Demonstrates understanding / competency      Outcomes   Expected Outcomes  Demonstrated interest in learning. Expect positive outcomes    Future DMSE  PRN    Program Status  Completed      Individualized Plan for Diabetes Self-Management Training:   Learning Objective:  Patient will have a greater understanding of diabetes self-management. Patient education plan is to attend individual and/or group sessions per assessed needs and concerns.   Plan:   Patient Instructions  Plan:  We identified that Starch, Fruit and Milk contain carbohydrate and it is helpful to spread those types of foods evenly throughout the day with meals and snacks. Continue with your activity level daily as tolerated, but consider sitting down every hour or so, and take a little break  Expected Outcomes:  Demonstrated interest in learning. Expect positive outcomes  Education material provided: Living Well with Diabetes, Meal plan card and Carbohydrate counting sheet  If problems or questions, patient to contact team via:  Phone  Future DSME appointment: PRN

## 2017-11-04 DIAGNOSIS — Z7189 Other specified counseling: Secondary | ICD-10-CM | POA: Diagnosis not present

## 2017-11-04 DIAGNOSIS — M810 Age-related osteoporosis without current pathological fracture: Secondary | ICD-10-CM | POA: Diagnosis not present

## 2017-11-04 DIAGNOSIS — E663 Overweight: Secondary | ICD-10-CM | POA: Diagnosis not present

## 2017-11-04 DIAGNOSIS — Z0000 Encounter for general adult medical examination without abnormal findings: Secondary | ICD-10-CM | POA: Diagnosis not present

## 2017-11-04 DIAGNOSIS — R350 Frequency of micturition: Secondary | ICD-10-CM | POA: Diagnosis not present

## 2017-11-04 DIAGNOSIS — I10 Essential (primary) hypertension: Secondary | ICD-10-CM | POA: Diagnosis not present

## 2017-11-04 DIAGNOSIS — F17210 Nicotine dependence, cigarettes, uncomplicated: Secondary | ICD-10-CM | POA: Diagnosis not present

## 2017-11-04 DIAGNOSIS — N183 Chronic kidney disease, stage 3 (moderate): Secondary | ICD-10-CM | POA: Diagnosis not present

## 2017-11-04 DIAGNOSIS — E119 Type 2 diabetes mellitus without complications: Secondary | ICD-10-CM | POA: Diagnosis not present

## 2017-11-04 DIAGNOSIS — Z1389 Encounter for screening for other disorder: Secondary | ICD-10-CM | POA: Diagnosis not present

## 2017-11-20 ENCOUNTER — Other Ambulatory Visit: Payer: Self-pay | Admitting: Physician Assistant

## 2017-11-20 DIAGNOSIS — R350 Frequency of micturition: Secondary | ICD-10-CM | POA: Diagnosis not present

## 2017-12-26 ENCOUNTER — Other Ambulatory Visit: Payer: Self-pay | Admitting: Physician Assistant

## 2017-12-26 ENCOUNTER — Other Ambulatory Visit: Payer: Self-pay | Admitting: Internal Medicine

## 2017-12-26 DIAGNOSIS — Z1231 Encounter for screening mammogram for malignant neoplasm of breast: Secondary | ICD-10-CM

## 2018-01-20 ENCOUNTER — Ambulatory Visit: Payer: Medicare Other

## 2018-01-23 ENCOUNTER — Other Ambulatory Visit: Payer: Self-pay | Admitting: Physician Assistant

## 2018-02-13 DIAGNOSIS — E119 Type 2 diabetes mellitus without complications: Secondary | ICD-10-CM | POA: Diagnosis not present

## 2018-02-13 DIAGNOSIS — H2513 Age-related nuclear cataract, bilateral: Secondary | ICD-10-CM | POA: Diagnosis not present

## 2018-02-13 DIAGNOSIS — H40053 Ocular hypertension, bilateral: Secondary | ICD-10-CM | POA: Diagnosis not present

## 2018-02-13 DIAGNOSIS — H40013 Open angle with borderline findings, low risk, bilateral: Secondary | ICD-10-CM | POA: Diagnosis not present

## 2018-02-13 DIAGNOSIS — H25013 Cortical age-related cataract, bilateral: Secondary | ICD-10-CM | POA: Diagnosis not present

## 2018-02-21 ENCOUNTER — Ambulatory Visit
Admission: RE | Admit: 2018-02-21 | Discharge: 2018-02-21 | Disposition: A | Discharge status: Home / Self Care | Payer: Medicare Other | Source: Ambulatory Visit | Attending: Internal Medicine | Admitting: Internal Medicine

## 2018-02-21 DIAGNOSIS — Z1231 Encounter for screening mammogram for malignant neoplasm of breast: Secondary | ICD-10-CM

## 2018-05-06 DIAGNOSIS — M810 Age-related osteoporosis without current pathological fracture: Secondary | ICD-10-CM | POA: Diagnosis not present

## 2018-05-06 DIAGNOSIS — Z720 Tobacco use: Secondary | ICD-10-CM | POA: Diagnosis not present

## 2018-05-06 DIAGNOSIS — J449 Chronic obstructive pulmonary disease, unspecified: Secondary | ICD-10-CM | POA: Diagnosis not present

## 2018-05-06 DIAGNOSIS — I10 Essential (primary) hypertension: Secondary | ICD-10-CM | POA: Diagnosis not present

## 2018-05-06 DIAGNOSIS — E1169 Type 2 diabetes mellitus with other specified complication: Secondary | ICD-10-CM | POA: Diagnosis not present

## 2018-05-06 DIAGNOSIS — E7800 Pure hypercholesterolemia, unspecified: Secondary | ICD-10-CM | POA: Diagnosis not present

## 2018-06-04 ENCOUNTER — Ambulatory Visit (INDEPENDENT_AMBULATORY_CARE_PROVIDER_SITE_OTHER): Payer: Medicare Other

## 2018-06-04 ENCOUNTER — Encounter (INDEPENDENT_AMBULATORY_CARE_PROVIDER_SITE_OTHER): Payer: Self-pay | Admitting: Orthopaedic Surgery

## 2018-06-04 ENCOUNTER — Ambulatory Visit (INDEPENDENT_AMBULATORY_CARE_PROVIDER_SITE_OTHER): Payer: Medicare Other | Admitting: Orthopaedic Surgery

## 2018-06-04 DIAGNOSIS — M25551 Pain in right hip: Secondary | ICD-10-CM | POA: Diagnosis not present

## 2018-06-04 DIAGNOSIS — M25552 Pain in left hip: Secondary | ICD-10-CM | POA: Diagnosis not present

## 2018-06-04 DIAGNOSIS — G8929 Other chronic pain: Secondary | ICD-10-CM | POA: Diagnosis not present

## 2018-06-04 DIAGNOSIS — M5442 Lumbago with sciatica, left side: Secondary | ICD-10-CM

## 2018-06-04 DIAGNOSIS — M5441 Lumbago with sciatica, right side: Secondary | ICD-10-CM

## 2018-06-04 MED ORDER — METHYLPREDNISOLONE 4 MG PO TABS: ORAL_TABLET | ORAL | 0 refills | Status: DC

## 2018-06-04 NOTE — Progress Notes (Signed)
Patient is a very active 77 year old female who is been dealing with low back pain and stiffness in her back with sciatic symptoms from both hips down to both feet when she first gets up in the morning.  She says she is very stiff and when she walks around for about 30 minutes things subside significantly.  However is been slowly worsening with time over the last several weeks.  She cannot take anti-inflammatories due to kidney disease.  She is a prediabetic.  She denies any specific injuries.  She would like to be more active and she is frustrated by the stiffness that she is been developing.  She is never had back surgery.  She denies any groin pain.  She denies any change in bowel bladder function.  On exam she does have tight hamstrings and stiff lumbar spine with flexion extension causing some pain in the lumbar spine.  She has negative straight leg raise bilaterally and actually good strength in her bilateral lower extremities.  X-rays of the pelvis and lumbar spine show no acute findings or gross malalignment or deformities.  At this point I do feel that she would benefit from a 6-day steroid taper as well as Tylenol arthritis to take with Tylenol p.m. in the evening.  Also do feel that she would benefit from formal physical therapy to work on her lumbar spine with any modalities that can help strengthen her core muscles strength in her hamstrings and legs as well as potentially traction to take pressure off the sciatic nerve bilaterally.  She agrees with this treatment course.  All question concerns were answered and addressed.  We will see her back in 4 weeks to see how she is doing overall.  She would be someone that I would MRI her lumbar spine if the sciatic symptoms continue.

## 2018-06-06 ENCOUNTER — Other Ambulatory Visit (INDEPENDENT_AMBULATORY_CARE_PROVIDER_SITE_OTHER): Payer: Self-pay

## 2018-06-06 DIAGNOSIS — G8929 Other chronic pain: Secondary | ICD-10-CM

## 2018-06-06 DIAGNOSIS — M5442 Lumbago with sciatica, left side: Principal | ICD-10-CM

## 2018-06-28 ENCOUNTER — Encounter (HOSPITAL_COMMUNITY): Payer: Self-pay | Admitting: Emergency Medicine

## 2018-06-28 ENCOUNTER — Ambulatory Visit (HOSPITAL_COMMUNITY)
Admission: EM | Admit: 2018-06-28 | Discharge: 2018-06-28 | Disposition: A | Discharge status: Home / Self Care | Payer: Medicare Other | Attending: Internal Medicine | Admitting: Internal Medicine

## 2018-06-28 DIAGNOSIS — H1032 Unspecified acute conjunctivitis, left eye: Secondary | ICD-10-CM | POA: Diagnosis not present

## 2018-06-28 MED ORDER — POLYMYXIN B-TRIMETHOPRIM 10000-0.1 UNIT/ML-% OP SOLN: 1.0000 [drp] | OPHTHALMIC | 0 refills | Status: AC

## 2018-06-28 NOTE — Discharge Instructions (Signed)
Please begin using Polytrim eyedrops every 6 hours in your left eye Apply cool compresses to eye to help with discomfort  Please follow-up with ophthalmology this week if symptoms persisting  Your blood pressure was elevated today in clinic. Please be sure to take blood pressure medications as prescribed. Please monitor your blood pressure at home or when you go to a [facility]. Please follow up with your primary care doctor to recheck blood pressure and discuss any need for medication changes.   Please go to Emergency Room if you start to experience severe headache, vision changes, decreased urine production, chest pain, shortness of breath, speech slurring, one sided weakness.

## 2018-06-28 NOTE — ED Triage Notes (Signed)
Pt here for left sided head pain and redness to left eye with watering and irritation

## 2018-06-28 NOTE — ED Provider Notes (Signed)
[facility]    CSN: [id number] Arrival date & time: 06/28/18  1030     History   Chief Complaint Chief Complaint  Patient presents with  . Eye Pain    HPI Kristin Davidson is a 77 y.o. female history of hypertension, GERD, presenting today for evaluation of left eye redness and discomfort.  Patient states that for the past 2 weeks she has been waking up with a left-sided headache, this is been resolving throughout the day.  This has completely resolved, but the past 2 days she has woken up with worsening redness in her eye.  She feels as if there is a film on it and has had some watery drainage.  Occasional blurry vision.  Patient does not wear contacts, but does wear glasses.  States that she only has discomfort when looking towards the left side.  Has been using saline eyedrops.  HPI  Past Medical History:  Diagnosis Date  . Arthritis   . Gout   . Hypertension   . Kidney disease     Patient Active Problem List   Diagnosis Date Noted  . 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 HYPERTENSION 08/06/2007  . ALLERGIC RHINITIS 08/06/2007  . GERD 08/06/2007  . BREAST CYST 08/06/2007  . 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 ABDOMINAL HYSTERECTOMY    . ABDOMINAL SURGERY    . BREAST EXCISIONAL BIOPSY Right    benign  . KNEE SURGERY      OB History   None      Home Medications    Prior to Admission medications   Medication Sig Start Date End Date Taking? Authorizing Provider  allopurinol (ZYLOPRIM) 300 MG tablet Take 150-300 mg by mouth daily. Alternate from 1/2 and 1 tablet daily 05/07/16   [provider]  amLODipine (NORVASC) 5 MG tablet Take 5 mg by mouth daily.    [provider]  benazepril (LOTENSIN) 10 MG tablet Take 10 mg by mouth daily. 03/09/16   [provider]  LINZESS 145 MCG CAPS capsule TAKE 1 CAPSULE BY MOUTH ONCE DAILY BEFORE BREAKFAST 01/23/18   Levin Erp, PA  methylPREDNISolone (MEDROL) 4 MG tablet Medrol dose pack. Take as instructed 06/04/18   Mcarthur Rossetti, MD  polyethylene glycol powder (GLYCOLAX/MIRALAX) powder Place 1 capful in 6 oz liquid and drink. Repeat this twice over the next 2 hours. Patient taking differently: One capful daily in 8 oz of water 05/24/16   Janne Napoleon, NP  trimethoprim-polymyxin b (POLYTRIM) ophthalmic solution Place 1 drop into the left eye every 4 (four) hours for 7 days. 06/28/18 07/05/18  Prem Coykendall, Elesa Hacker, PA-C    Family History History reviewed. No pertinent family history.  Social History Social History   Tobacco Use  . Smoking status: Current Every Day Smoker  . Smokeless tobacco: Never Used  Substance Use Topics  . Alcohol use: No  . Drug use: No     Allergies   Hydromorphone hcl; Naproxen sodium; Sulfonamide derivatives; and Tramadol hcl   Review of Systems Review of Systems  Constitutional: Negative for activity change, appetite change, chills, fatigue and fever.  HENT: Negative for congestion, ear pain, rhinorrhea, sinus pressure, sore throat and trouble swallowing.   Eyes: Positive for discharge and redness. Negative for photophobia and visual disturbance.  Respiratory: Negative for cough, chest tightness and shortness of breath.   Cardiovascular: Negative for chest pain.  Gastrointestinal: Negative for abdominal pain,  diarrhea, nausea and vomiting.  Musculoskeletal: Negative for myalgias.  Skin: Negative for rash.  Neurological: Negative for dizziness, light-headedness and headaches.     Physical Exam Triage Vital Signs ED Triage Vitals [06/28/18 1200]  Enc Vitals Group     BP (!) 188/103     Pulse Rate 74     Resp 18     Temp 98.4 F (36.9 C)     Temp Source Oral     SpO2 95 %     Weight      Height      Head Circumference      Peak Flow      Pain Score      Pain Loc      Pain Edu?      Excl. in [place]?    No data found.  Updated Vital Signs BP (!)  188/103 (BP Location: Left Arm)   Pulse 74   Temp 98.4 F (36.9 C) (Oral)   Resp 18   SpO2 95%  Blood pressure rechecked, 188/98 left arm, 183/102 right arm Visual Acuity with glasses Right Eye Distance:  20/30 Left Eye Distance:  20/30 Bilateral Distance:  20/20  Right Eye Near:   Left Eye Near:    Bilateral Near:     Physical Exam  Constitutional: She is oriented to person, place, and time. She appears well-developed and well-nourished. No distress.  HENT:  Head: Normocephalic and atraumatic.  Mouth/Throat: Oropharynx is clear and moist.  Left EAC normal, left TM pearly gray, good bony landmarks  Eyes: Pupils are equal, round, and reactive to light. EOM are normal.  Left conjunctiva with increased erythema compared to right, no discharge seen, limbus clear, corneal arcus present bilaterally  Neck: Neck supple.  Cardiovascular: Normal rate and regular rhythm.  No murmur heard. Pulmonary/Chest: Effort normal and breath sounds normal. No respiratory distress.  Abdominal: Soft. There is no tenderness.  Musculoskeletal: She exhibits no edema.  Strength 5/5 and equal bilaterally at shoulders and hips  Neurological: She is alert and oriented to person, place, and time. She displays normal reflexes. No cranial nerve deficit. Coordination normal.  Skin: Skin is warm and dry.  Psychiatric: She has a normal mood and affect.  Nursing note and vitals reviewed.    UC Treatments / Results  Labs (all labs ordered are listed, but only abnormal results are displayed) Labs Reviewed - No data to display  EKG None  Radiology No results found.  Procedures Procedures (including critical care time)  Medications Ordered in UC Medications - No data to display  Initial Impression / Assessment and Plan / UC Course  I have reviewed the triage vital signs and the nursing notes.  Pertinent labs & imaging results that were available during my care of the patient were reviewed by me and  considered in my medical decision making (see chart for details).     Patient with conjunctival erythema, visual acuity intact, discomfort only with looking towards the left.  Will treat for conjunctivitis, likely viral, will provide Polytrim.  Follow-up with ophthalmology if symptoms persisting.  Return here emergency room if symptoms worsening.  Patient's blood pressure elevated today, has not taken blood pressure medicines.  Recommended patient to take blood pressure medicine when she gets home, monitor blood pressure today and over the next few days.  Follow-up with PCP if remaining elevated.  Please return to emergency room if blood pressure staying high and having eye pain, chest pain, shortness of breath, etc.Discussed strict  return precautions. Patient verbalized understanding and is agreeable with plan.   Final Clinical Impressions(s) / UC Diagnoses   Final diagnoses:  Acute conjunctivitis of left eye, unspecified acute conjunctivitis type     Discharge Instructions     Please begin using Polytrim eyedrops every 6 hours in your left eye Apply cool compresses to eye to help with discomfort  Please follow-up with ophthalmology this week if symptoms persisting  Your blood pressure was elevated today in clinic. Please be sure to take blood pressure medications as prescribed. Please monitor your blood pressure at home or when you go to a [facility]. Please follow up with your primary care doctor to recheck blood pressure and discuss any need for medication changes.   Please go to Emergency Room if you start to experience severe headache, vision changes, decreased urine production, chest pain, shortness of breath, speech slurring, one sided weakness.   ED Prescriptions    Medication Sig Dispense Auth. Provider   trimethoprim-polymyxin b (POLYTRIM) ophthalmic solution Place 1 drop into the left eye every 4 (four) hours for 7 days. 10 mL Camia Dipinto C, PA-C     Controlled  Substance Prescriptions [place] Controlled Substance Registry consulted? Not Applicable   Janith Lima, [place] 06/28/18 1419

## 2018-06-30 DIAGNOSIS — Z23 Encounter for immunization: Secondary | ICD-10-CM | POA: Diagnosis not present

## 2018-07-28 ENCOUNTER — Emergency Department (HOSPITAL_COMMUNITY): Payer: Medicare Other

## 2018-07-28 ENCOUNTER — Emergency Department (HOSPITAL_COMMUNITY)
Admission: EM | Admit: 2018-07-28 | Discharge: 2018-07-28 | Disposition: A | Discharge status: Home / Self Care | Payer: Medicare Other | Attending: Emergency Medicine | Admitting: Emergency Medicine

## 2018-07-28 ENCOUNTER — Encounter (HOSPITAL_COMMUNITY): Payer: Self-pay | Admitting: Emergency Medicine

## 2018-07-28 ENCOUNTER — Other Ambulatory Visit: Payer: Self-pay

## 2018-07-28 DIAGNOSIS — R1084 Generalized abdominal pain: Secondary | ICD-10-CM | POA: Insufficient documentation

## 2018-07-28 DIAGNOSIS — I10 Essential (primary) hypertension: Secondary | ICD-10-CM | POA: Insufficient documentation

## 2018-07-28 DIAGNOSIS — R109 Unspecified abdominal pain: Secondary | ICD-10-CM | POA: Diagnosis present

## 2018-07-28 DIAGNOSIS — F17210 Nicotine dependence, cigarettes, uncomplicated: Secondary | ICD-10-CM | POA: Insufficient documentation

## 2018-07-28 DIAGNOSIS — K5900 Constipation, unspecified: Secondary | ICD-10-CM | POA: Diagnosis not present

## 2018-07-28 MED ORDER — MINERAL OIL RE ENEM
1.0000 | ENEMA | Freq: Once | RECTAL | Status: AC
  Administered 2018-07-28: 1 via RECTAL
  Filled 2018-07-28: qty 1

## 2018-07-28 MED ORDER — LORAZEPAM 0.5 MG PO TABS
0.5000 mg | ORAL_TABLET | Freq: Once | ORAL | Status: AC
  Administered 2018-07-28: 0.5 mg via ORAL
  Filled 2018-07-28: qty 1

## 2018-07-28 NOTE — ED Provider Notes (Signed)
[facility] DEPT Provider Note   CSN: [id number] Arrival date & time: 07/28/18  5188     History   Chief Complaint Chief Complaint  Patient presents with  . Constipation    HPI Kristin Davidson is a 77 y.o. female.  77 year old female with history of constipation who presents with 2 days of abdominal fullness consistent with her constipation.  Patient missed with the doses of her Linzess and she thinks that this is was contributing to her current symptoms.  Denies any fever or vomiting.  Has used an enema at home as well as laxatives without relief.     Past Medical History:  Diagnosis Date  . Arthritis   . Gout   . Hypertension   . Kidney disease     Patient Active Problem List   Diagnosis Date Noted  . 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 HYPERTENSION 08/06/2007  . ALLERGIC RHINITIS 08/06/2007  . GERD 08/06/2007  . BREAST CYST 08/06/2007  . 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 ABDOMINAL HYSTERECTOMY    . ABDOMINAL SURGERY    . BREAST EXCISIONAL BIOPSY Right    benign  . KNEE SURGERY       OB History   None      Home Medications    Prior to Admission medications   Medication Sig Start Date End Date Taking? Authorizing Provider  allopurinol (ZYLOPRIM) 300 MG tablet Take 150-300 mg by mouth daily. Alternate from 1/2 and 1 tablet daily 05/07/16   [provider]  amLODipine (NORVASC) 5 MG tablet Take 5 mg by mouth daily.    [provider]  benazepril (LOTENSIN) 10 MG tablet Take 10 mg by mouth daily. 03/09/16   [provider]  LINZESS 145 MCG CAPS capsule TAKE 1 CAPSULE BY MOUTH ONCE DAILY BEFORE BREAKFAST 01/23/18   Levin Erp, PA  methylPREDNISolone (MEDROL) 4 MG tablet Medrol dose pack. Take as instructed 06/04/18   Mcarthur Rossetti, MD  polyethylene glycol powder (GLYCOLAX/MIRALAX) powder Place 1 capful in 6 oz liquid and  drink. Repeat this twice over the next 2 hours. Patient taking differently: One capful daily in 8 oz of water 05/24/16   Janne Napoleon, NP    Family History No family history on file.  Social History Social History   Tobacco Use  . Smoking status: Current Every Day Smoker  . Smokeless tobacco: Never Used  Substance Use Topics  . Alcohol use: No  . Drug use: No     Allergies   Hydromorphone hcl; Naproxen sodium; Sulfonamide derivatives; and Tramadol hcl   Review of Systems Review of Systems  All other systems reviewed and are negative.    Physical Exam Updated Vital Signs BP (!) 185/103   Pulse 74   Temp 98.1 F (36.7 C) (Oral)   Resp 12   Ht 1.549 m (5\' 1" )   Wt 67.1 kg   SpO2 100%   BMI 27.96 kg/m   Physical Exam  Constitutional: She is oriented to person, place, and time. She appears well-developed and well-nourished.  Non-toxic appearance. No distress.  HENT:  Head: Normocephalic and atraumatic.  Eyes: Pupils are equal, round, and reactive to light. Conjunctivae, EOM and lids are normal.  Neck: Normal range of motion. Neck supple. No tracheal deviation present. No thyroid mass present.  Cardiovascular: Normal rate, regular rhythm and normal heart sounds. Exam reveals no gallop.  No murmur heard. Pulmonary/Chest: Effort  normal and breath sounds normal. No stridor. No respiratory distress. She has no decreased breath sounds. She has no wheezes. She has no rhonchi. She has no rales.  Abdominal: Soft. Normal appearance and bowel sounds are normal. She exhibits no distension. There is no tenderness. There is no rebound and no CVA tenderness.  Musculoskeletal: Normal range of motion. She exhibits no edema or tenderness.  Neurological: She is alert and oriented to person, place, and time. She has normal strength. No cranial nerve deficit or sensory deficit. GCS eye subscore is 4. GCS verbal subscore is 5. GCS motor subscore is 6.  Skin: Skin is warm and dry. No abrasion  and no rash noted.  Psychiatric: She has a normal mood and affect. Her speech is normal and behavior is normal.  Nursing note and vitals reviewed.    ED Treatments / Results  Labs (all labs ordered are listed, but only abnormal results are displayed) Labs Reviewed - No data to display  EKG None  Radiology No results found.  Procedures Procedures (including critical care time)  Medications Ordered in ED Medications - No data to display   Initial Impression / Assessment and Plan / ED Course  I have reviewed the triage vital signs and the nursing notes.  Pertinent labs & imaging results that were available during my care of the patient were reviewed by me and considered in my medical decision making (see chart for details).     Acute abdominal series without evidence of obstruction.  Patient does admit to being slightly anxious will be given Ativan here.  Blood pressure elevation noted and she will take her home medications and patient stable for discharge  Final Clinical Impressions(s) / ED Diagnoses   Final diagnoses:  None    ED Discharge Orders    None       Lacretia Leigh, MD 07/28/18 1227

## 2018-07-28 NOTE — ED Triage Notes (Signed)
Patient came in with family from home. Pt c/o constipation that has been going on for 3 days. Pt states that every time they try to bare down they have pain on the side of the abd.

## 2018-07-28 NOTE — ED Notes (Signed)
Patient transported to X-ray 

## 2018-07-28 NOTE — ED Notes (Signed)
ED Provider at bedside. 

## 2018-08-18 ENCOUNTER — Other Ambulatory Visit: Payer: Self-pay | Admitting: Nurse Practitioner

## 2018-08-18 ENCOUNTER — Ambulatory Visit
Admission: RE | Admit: 2018-08-18 | Discharge: 2018-08-18 | Disposition: A | Discharge status: Home / Self Care | Payer: Medicare Other | Source: Ambulatory Visit | Attending: Nurse Practitioner | Admitting: Nurse Practitioner

## 2018-08-18 DIAGNOSIS — K5900 Constipation, unspecified: Secondary | ICD-10-CM | POA: Diagnosis not present

## 2018-08-24 ENCOUNTER — Encounter (HOSPITAL_COMMUNITY): Payer: Self-pay | Admitting: Emergency Medicine

## 2018-08-24 ENCOUNTER — Emergency Department (HOSPITAL_COMMUNITY): Payer: Medicare Other

## 2018-08-24 ENCOUNTER — Emergency Department (HOSPITAL_COMMUNITY)
Admission: EM | Admit: 2018-08-24 | Discharge: 2018-08-24 | Disposition: A | Discharge status: Home / Self Care | Payer: Medicare Other | Attending: Emergency Medicine | Admitting: Emergency Medicine

## 2018-08-24 ENCOUNTER — Other Ambulatory Visit: Payer: Self-pay

## 2018-08-24 DIAGNOSIS — Z79899 Other long term (current) drug therapy: Secondary | ICD-10-CM | POA: Insufficient documentation

## 2018-08-24 DIAGNOSIS — K5900 Constipation, unspecified: Secondary | ICD-10-CM | POA: Diagnosis not present

## 2018-08-24 DIAGNOSIS — R109 Unspecified abdominal pain: Secondary | ICD-10-CM | POA: Diagnosis not present

## 2018-08-24 DIAGNOSIS — N289 Disorder of kidney and ureter, unspecified: Secondary | ICD-10-CM | POA: Diagnosis not present

## 2018-08-24 DIAGNOSIS — F17200 Nicotine dependence, unspecified, uncomplicated: Secondary | ICD-10-CM | POA: Diagnosis not present

## 2018-08-24 DIAGNOSIS — I10 Essential (primary) hypertension: Secondary | ICD-10-CM | POA: Diagnosis not present

## 2018-08-24 DIAGNOSIS — R1012 Left upper quadrant pain: Secondary | ICD-10-CM | POA: Diagnosis not present

## 2018-08-24 DIAGNOSIS — R140 Abdominal distension (gaseous): Secondary | ICD-10-CM | POA: Diagnosis not present

## 2018-08-24 LAB — COMPREHENSIVE METABOLIC PANEL
ALK PHOS: 77 U/L (ref 38–126)
ALT: 24 U/L (ref 0–44)
AST: 30 U/L (ref 15–41)
Albumin: 4.3 g/dL (ref 3.5–5.0)
Anion gap: 9 (ref 5–15)
BILIRUBIN TOTAL: 0.5 mg/dL (ref 0.3–1.2)
BUN: 14 mg/dL (ref 8–23)
CALCIUM: 9.3 mg/dL (ref 8.9–10.3)
CO2: 22 mmol/L (ref 22–32)
CREATININE: 1.16 mg/dL — AB (ref 0.44–1.00)
Chloride: 107 mmol/L (ref 98–111)
GFR, EST AFRICAN AMERICAN: 51 mL/min — AB (ref >60)
GFR, EST NON AFRICAN AMERICAN: 44 mL/min — AB (ref >60)
Glucose, Bld: 116 mg/dL — ABNORMAL HIGH (ref 70–99)
Potassium: 4.7 mmol/L (ref 3.5–5.1)
Sodium: 138 mmol/L (ref 135–145)
Total Protein: 7.3 g/dL (ref 6.5–8.1)

## 2018-08-24 LAB — I-STAT CHEM 8, ED
BUN: 16 mg/dL (ref 8–23)
CALCIUM ION: 1.17 mmol/L (ref 1.15–1.40)
CHLORIDE: 105 mmol/L (ref 98–111)
CREATININE: 1.2 mg/dL — AB (ref 0.44–1.00)
GLUCOSE: 113 mg/dL — AB (ref 70–99)
HCT: 45 % (ref 36.0–46.0)
Hemoglobin: 15.3 g/dL — ABNORMAL HIGH (ref 12.0–15.0)
Potassium: 4.7 mmol/L (ref 3.5–5.1)
Sodium: 138 mmol/L (ref 135–145)
TCO2: 26 mmol/L (ref 22–32)

## 2018-08-24 LAB — CBC WITH DIFFERENTIAL/PLATELET
Abs Immature Granulocytes: 0.03 10*3/uL (ref 0.00–0.07)
BASOS PCT: 0 %
Basophils Absolute: 0 10*3/uL (ref 0.0–0.1)
EOS PCT: 1 %
Eosinophils Absolute: 0.1 10*3/uL (ref 0.0–0.5)
HCT: 45.8 % (ref 36.0–46.0)
HEMOGLOBIN: 14.1 g/dL (ref 12.0–15.0)
Immature Granulocytes: 1 %
Lymphocytes Relative: 24 %
Lymphs Abs: 1.4 10*3/uL (ref 0.7–4.0)
MCH: 28.5 pg (ref 26.0–34.0)
MCHC: 30.8 g/dL (ref 30.0–36.0)
MCV: 92.7 fL (ref 80.0–100.0)
MONO ABS: 0.7 10*3/uL (ref 0.1–1.0)
Monocytes Relative: 11 %
Neutro Abs: 3.8 10*3/uL (ref 1.7–7.7)
Neutrophils Relative %: 63 %
PLATELETS: 202 10*3/uL (ref 150–400)
RBC: 4.94 MIL/uL (ref 3.87–5.11)
RDW: 13.6 % (ref 11.5–15.5)
WBC: 6 10*3/uL (ref 4.0–10.5)
nRBC: 0 % (ref 0.0–0.2)

## 2018-08-24 LAB — LIPASE, BLOOD: LIPASE: 44 U/L (ref 11–51)

## 2018-08-24 MED ORDER — SODIUM CHLORIDE (PF) 0.9 % IJ SOLN
INTRAMUSCULAR | Status: AC
  Filled 2018-08-24: qty 50

## 2018-08-24 MED ORDER — IOHEXOL 300 MG/ML  SOLN
75.0000 mL | Freq: Once | INTRAMUSCULAR | Status: AC | PRN
  Administered 2018-08-24: 75 mL via INTRAVENOUS

## 2018-08-24 MED ORDER — DOXYLAMINE SUCCINATE (SLEEP) 25 MG PO TABS: 12.5000 mg | ORAL_TABLET | Freq: Every evening | ORAL | 0 refills | Status: DC | PRN

## 2018-08-24 NOTE — ED Notes (Signed)
Pt presents to ER with L sided abdominal pain, abdomen tender to palpation in specific region which pt states is where she has a hernia.  Pt reports she has dealt with chronic abdominal pain for the last 2years 3 months years, but tonight's pain feels worse to her.  Pt states she had her last good bowel movement on Monday, and had a small on yesterday (Saturday).  Pt states her daughter helped do two enemas last night, pt has been taking prune juice, magnesium citrate at home without relief.

## 2018-08-24 NOTE — ED Notes (Signed)
Pt in radiology 

## 2018-08-24 NOTE — Discharge Instructions (Addendum)
1) Follow-up with your primary care doctor to set up an outpatient soft tissue ultrasound to characterize the lump that is felt in the left upper side of the abdomen. 2) You will need a repeat CT scan in 1 year to reevaluate the renal lesion seen on your CT scan today.  The renal lesion was seen on the left kidney  Abdominal (belly) pain can be caused by many things. Your caregiver performed an examination and possibly ordered blood/urine tests and imaging (CT scan, x-rays, ultrasound). Many cases can be observed and treated at home after initial evaluation in the emergency department. Even though you are being discharged home, abdominal pain can be unpredictable. Therefore, you need a repeated exam if your pain does not resolve, returns, or worsens. Most patients with abdominal pain don't have to be admitted to the hospital or have surgery, but serious problems like appendicitis and gallbladder attacks can start out as nonspecific pain. Many abdominal conditions cannot be diagnosed in one visit, so follow-up evaluations are very important. SEEK IMMEDIATE MEDICAL ATTENTION IF: The pain does not go away or becomes severe.  A temperature above 101 develops.  Repeated vomiting occurs (multiple episodes).  The pain becomes localized to portions of the abdomen. The right side could possibly be appendicitis. In an adult, the left lower portion of the abdomen could be colitis or diverticulitis.  Blood is being passed in stools or vomit (bright red or black tarry stools).  Return also if you develop chest pain, difficulty breathing, dizziness or fainting, or become confused, poorly responsive, or inconsolable (young children).

## 2018-08-24 NOTE — ED Provider Notes (Signed)
[facility] DEPT Provider Note   CSN: [id number] Arrival date & time: 08/24/18  0305     History   Chief Complaint Chief Complaint  Patient presents with  . Constipation    HPI MI BALLA is a 77 y.o. female who  has a past medical history of Arthritis, Gout, Hypertension, and Kidney disease. She also has a hx of chronic constipation. Patient presentw with cc of constipation. She usually uses linzess, miralax, and prune juice. She has a hx of previous abdominal surgery. She c/o inability to produce a BM. She says that she has had a couple of " small squirts" but has not produced a cathartic BM in several days. Since last night she has tried all of the previously mentioned medications and has taken magnesium citrate and has had 2 enemas.  Her daughter who is at bedside states that all that came out of her bottom was "water."  She denies rectal urgency, abdominal pain, fevers, chills, nausea or vomiting.  She denies any recent episodes of productive or voluminous watery stools.   HPI  Past Medical History:  Diagnosis Date  . Arthritis   . Gout   . Hypertension   . Kidney disease     Patient Active Problem List   Diagnosis Date Noted  . 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 HYPERTENSION 08/06/2007  . ALLERGIC RHINITIS 08/06/2007  . GERD 08/06/2007  . BREAST CYST 08/06/2007  . 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 ABDOMINAL HYSTERECTOMY    . ABDOMINAL SURGERY    . BREAST EXCISIONAL BIOPSY Right    benign  . KNEE SURGERY       OB History   None      Home Medications    Prior to Admission medications   Medication Sig Start Date End Date Taking? Authorizing Provider  allopurinol (ZYLOPRIM) 300 MG tablet Take 150-300 mg by mouth daily. Alternate from 1/2 and 1 tablet daily 05/07/16   [provider]  amLODipine (NORVASC) 5 MG tablet Take 5 mg by mouth daily.    [provider]  benazepril (LOTENSIN) 10 MG tablet Take 10 mg by mouth daily. 03/09/16   [provider]  doxylamine, Sleep, (UNISOM) 25 MG tablet Take 0.5-1 tablets (12.5-25 mg total) by mouth at bedtime as needed for sleep. 08/24/18   Albi Rappaport, PA-C  LINZESS 145 MCG CAPS capsule TAKE 1 CAPSULE BY MOUTH ONCE DAILY BEFORE BREAKFAST Patient taking differently: Take 145 mcg by mouth daily before breakfast.  01/23/18   Levin Erp, PA  methylPREDNISolone (MEDROL) 4 MG tablet Medrol dose pack. Take as instructed Patient not taking: Reported on 07/28/2018 06/04/18   Mcarthur Rossetti, MD  polyethylene glycol powder (GLYCOLAX/MIRALAX) powder Place 1 capful in 6 oz liquid and drink. Repeat this twice over the next 2 hours. Patient not taking: Reported on 07/28/2018 05/24/16   Janne Napoleon, NP  VENTOLIN HFA 108 (90 Base) MCG/ACT inhaler Inhale 2 puffs into the lungs every 4 (four) hours as needed for shortness of breath. 06/23/18   [provider]  VESICARE 5 MG tablet Take 5 mg by mouth daily. 04/22/18   [provider]    Family History History reviewed. No pertinent family history.  Social History Social History   Tobacco Use  . Smoking status: Current Every Day Smoker  . Smokeless tobacco: Never Used  Substance Use Topics  . Alcohol use: No  .  Drug use: No     Allergies   Hydromorphone hcl; Naproxen sodium; Sulfonamide derivatives; and Tramadol hcl   Review of Systems Review of Systems   Physical Exam Updated Vital Signs BP (!) 147/62 (BP Location: Left Arm)   Pulse (!) 51   Temp 98.2 F (36.8 C) (Oral)   Resp 18   Ht 5\' 1"  (1.549 m)   Wt 75.8 kg   SpO2 98%   BMI 31.55 kg/m   Physical Exam  Constitutional: She is oriented to person, place, and time. She appears well-developed and well-nourished. No distress.  HENT:  Head: Normocephalic and atraumatic.  Eyes: Conjunctivae are normal. No scleral icterus.  Neck: Normal range of  motion.  Cardiovascular: Normal rate, regular rhythm and normal heart sounds. Exam reveals no gallop and no friction rub.  No murmur heard. Pulmonary/Chest: Effort normal and breath sounds normal. No respiratory distress.  Abdominal: Soft. Bowel sounds are normal. She exhibits no distension and no mass. There is no tenderness. There is no guarding.  Large, rotund abdomen Minimal general discomfort with palpation No guarding, distension, or rebound Normal bowel sounds, No hernias  Neurological: She is alert and oriented to person, place, and time.  Skin: Skin is warm and dry. She is not diaphoretic.  Psychiatric: Her behavior is normal.  Nursing note and vitals reviewed.    ED Treatments / Results  Labs (all labs ordered are listed, but only abnormal results are displayed) Labs Reviewed  COMPREHENSIVE METABOLIC PANEL - Abnormal; Notable for the following components:      Result Value   Glucose, Bld 116 (*)    Creatinine, Ser 1.16 (*)    GFR calc non Af Amer 44 (*)    GFR calc Af Amer 51 (*)    All other components within normal limits  I-STAT CHEM 8, ED - Abnormal; Notable for the following components:   Creatinine, Ser 1.20 (*)    Glucose, Bld 113 (*)    Hemoglobin 15.3 (*)    All other components within normal limits  LIPASE, BLOOD  CBC WITH DIFFERENTIAL/PLATELET    EKG None  Radiology Ct Abdomen Pelvis W Contrast  Result Date: 08/24/2018 CLINICAL DATA:  77 year old with abdominal distension. Constipation. EXAM: CT ABDOMEN AND PELVIS WITH CONTRAST TECHNIQUE: Multidetector CT imaging of the abdomen and pelvis was performed using the standard protocol following bolus administration of intravenous contrast. CONTRAST:  5mL OMNIPAQUE IOHEXOL 300 MG/ML  SOLN COMPARISON:  05/26/2016 FINDINGS: Lower chest: Minimal volume loss in the medial right middle lobe. Otherwise, the lung bases are clear. No pleural effusions. Hepatobiliary: Normal appearance of the liver, gallbladder and  portal venous system. No biliary dilatation. Pancreas: Unremarkable. No pancreatic ductal dilatation or surrounding inflammatory changes. Spleen: Normal in size without focal abnormality. Adrenals/Urinary Tract: Normal adrenals. Negative for hydronephrosis. Normal appearance of the right kidney. There is a subtle peripheral structure along the left kidney lower pole that measures roughly 0.6 cm. Hounsfield units in this area measures roughly 50 but structure is too small to definitively characterize. Along the base of the bladder, there is focal soft tissue thickening which may be related to the symphysis pubis. Similar finding was present on the prior CT. Otherwise, the urinary bladder is unremarkable. Stomach/Bowel: Small hiatal hernia. No evidence for bowel dilatation or focal inflammation. Appendix is not confidently identified. No significant stool burden. Vascular/Lymphatic: Atherosclerotic calcifications and plaque in the abdominal aorta without aneurysm. Atherosclerotic disease in the iliac and proximal femoral arteries bilaterally. Venous structures are  unremarkable. No lymph node enlargement in the abdomen or pelvis. Reproductive: Uterus has been removed. There appears to be bilateral ovarian tissue. Negative for an adnexal mass. Other: No free fluid.  Negative for free air. Musculoskeletal: Again noted is a extensive sclerosis on both sides of the SI joints bilaterally. Facet arthropathy in the lower lumbar spine. Soft tissue calcifications in the buttocks. IMPRESSION: 1. No acute abnormality in the abdomen or pelvis. 2. **An incidental finding of potential clinical significance has been found. Subtle indeterminate structure along the left kidney lower pole that measures roughly 0.6 cm. Structure is too small to definitively characterize. Consider 1 year follow-up CT of the abdomen with and without contrast to evaluate for interval change. This lesion is probably too small to be definitively characterized  with MRI.** 3.  Aortic Atherosclerosis (ICD10-I70.0). These results were called by telephone at the time of interpretation on 08/24/2018 at 10:32 am to [facility] , who verbally acknowledged these results. Electronically Signed   By: Markus Daft M.D.   On: 08/24/2018 10:36    Procedures Procedures (including critical care time)  Medications Ordered in ED Medications  iohexol (OMNIPAQUE) 300 MG/ML solution 75 mL (75 mLs Intravenous Contrast Given 08/24/18 0946)     Initial Impression / Assessment and Plan / ED Course  I have reviewed the triage vital signs and the nursing notes.  Pertinent labs & imaging results that were available during my care of the patient were reviewed by me and considered in my medical decision making (see chart for details).   77 year old female with a history of slow transit constipation.  Patient's labs appear to be at baseline.  She has been hemodynamically stable.  Her CT scan shows no significant abnormalities.  She does not have constipation.  She has stool in the colon which is predominately on the right side.  I had a long discussion with the patient and her daughter and feel that it is just moving its way along and she does not have anything to put out at this time that she should continue drinking plenty of fluids she may take her MiraLAX and drink.  Produce that she should produce of a bowel movement in the next day or 2.  She is really not having very much pain.  On reevaluation of her abdomen the patient does have a tender subcutaneous mass in the left upper quadrant of her abdomen which feels tender and mobile.  This may represent a lipoma or other subcu mass.  Patient was seen and shared visit with Dr. Tyrone Nine.  We discussed having an outpatient formal soft tissue ultrasound for evaluation.  It does not feel like a ventral hernia.  Patient also noted to have 0.6 cm renal lesion which is uncharacterized will at this time.  I discussed that with Dr. Anselm Pancoast who  asked that the patient have a repeat T scan in 1 year.  Patient was made aware of this finding and knows to follow-up in a year.  She appears appropriate for discharge at this time and should follow-up with her primary care physician in the next 7-10 days.    Final Clinical Impressions(s) / ED Diagnoses   Final diagnoses:  Abdominal wall pain  Renal lesion    ED Discharge Orders         Ordered    doxylamine, Sleep, (UNISOM) 25 MG tablet  At bedtime PRN     08/24/18 1106           Deborah Lazcano,  Vernie Shanks, PA-C 08/24/18 [place], DO 08/24/18 1524

## 2018-08-24 NOTE — ED Notes (Signed)
Bed: WA09 Expected date:  Expected time:  Means of arrival:  Comments: 

## 2018-08-24 NOTE — ED Provider Notes (Signed)
Medical screening examination/treatment/procedure(s) were conducted as a shared visit with non-physician practitioner(s) and myself.  I personally evaluated the patient during the encounter.  None   See the written copy of this report in the patient's paper medical record.  These results did not interface directly into the electronic medical record and are summarized here.  77 yo F here with left upper quadrant abdominal pain and constipation.  CT scan performed in without acute pathology.  My exam with a fatty feeling tissue in the left upper quadrant.  Mildly tender to palpation.  CT scan without hernia.  We will have her follow-up with her family physician.   Deno Etienne, DO 08/24/18 1524

## 2018-08-24 NOTE — ED Triage Notes (Signed)
Pt reports having constipation for the last several days with no relief with magnesium citrate or enema.

## 2018-08-29 DIAGNOSIS — R109 Unspecified abdominal pain: Secondary | ICD-10-CM | POA: Diagnosis not present

## 2018-09-03 ENCOUNTER — Telehealth: Payer: Self-pay | Admitting: Physician Assistant

## 2018-09-03 NOTE — Telephone Encounter (Signed)
Pt daughter call again, she is very concern about her mother constipation

## 2018-09-03 NOTE — Telephone Encounter (Signed)
The pt has been up since 1 am with "doubling over" left side abd pain. She has been taking miralax, prune juice, linzess, mag citrate and enemas. She has a history of constipation and was seen in the ED 11/17 "no stool burden" seen on CT and was seen by PCP on Monday for incidental findings and was told to stop all laxatives and only take linzess 290 mcg.  They will follow up on incidental finding.   The pt's daughter was advised to take the pt to the ED for eval.

## 2018-09-11 DIAGNOSIS — K5900 Constipation, unspecified: Secondary | ICD-10-CM | POA: Diagnosis not present

## 2018-10-03 ENCOUNTER — Ambulatory Visit (INDEPENDENT_AMBULATORY_CARE_PROVIDER_SITE_OTHER): Payer: Medicare Other | Admitting: Gastroenterology

## 2018-10-03 ENCOUNTER — Encounter: Payer: Self-pay | Admitting: Gastroenterology

## 2018-10-03 VITALS — BP 134/82 | HR 68 | Ht 59.25 in | Wt 170.0 lb

## 2018-10-03 DIAGNOSIS — K5900 Constipation, unspecified: Secondary | ICD-10-CM

## 2018-10-03 DIAGNOSIS — R1032 Left lower quadrant pain: Secondary | ICD-10-CM

## 2018-10-03 MED ORDER — SUPREP BOWEL PREP KIT 17.5-3.13-1.6 GM/177ML PO SOLN: ORAL | 0 refills | Status: DC

## 2018-10-03 MED ORDER — DICYCLOMINE HCL 10 MG PO CAPS: 10.0000 mg | ORAL_CAPSULE | Freq: Three times a day (TID) | ORAL | 1 refills | Status: DC | PRN

## 2018-10-03 NOTE — Patient Instructions (Addendum)
If you are age 77 or older, your body mass index should be between 23-30. Your Body mass index is 34.05 kg/m. If this is out of the aforementioned range listed, please consider follow up with your Primary Care Provider.  If you are age 29 or younger, your body mass index should be between 19-25. Your Body mass index is 34.05 kg/m. If this is out of the aformentioned range listed, please consider follow up with your Primary Care Provider.   You have been scheduled for a colonoscopy. Please follow written instructions given to you at your visit today.  Please pick up your prep supplies at the pharmacy within the next 1-3 days. If you use inhalers (even only as needed), please bring them with you on the day of your procedure. Your physician has requested that you go to www.startemmi.com and enter the access code given to you at your visit today. This web site gives a general overview about your procedure. However, you should still follow specific instructions given to you by our office regarding your preparation for the procedure.  We have sent the following medications to your pharmacy for you to pick up at your convenience: Bentyl 10mg : Take every 8 hours as needed  Thank you for entrusting me with your care and for choosing [facility], Dr. Pingree Cellar

## 2018-10-03 NOTE — Progress Notes (Signed)
HPI :  77 year old female here for a follow-up visit. She was seen here in January 2018 by Kristin Davidson, this is my first time meeting her.   She is accompanied by her daughter today. Her main complaint is persistent constipation. She states this is been ongoing now for about 2 years or so. She requires using Linzess dose at 290 g a day, as well as MiraLAX at least once a day, as well as a glass of prune juice to produce a bowel movement. If she is on this regimen she reports having usually one bowel movement a day. If she misses any portion of this regimen she will have difficulty moving her bowels. She denies any blood in her stools. She does have some ongoing left-sided abdominal pain for the past 3 months which is bothering her significantly. She reports having a bowel movement can often make it better, or sometimes it can make it worse. This recently went to an ER visit in November for her pain. She had a CT scan of the time showing a small indeterminate renal lesion of the left kidney, for which repeat CT scan was recommended in 1 year. Otherwise no clear cause for her symptoms. She is otherwise eating okay. She denies any family history of colon cancer. Her last colonoscopy was performed on 07/11/09 and showed diverticula scattered in the sigmoid colon, no polyps or cancers and otherwise normal exam. Repeat was recommended in 10 years. She otherwise denies any new medications changes to account for her bowel changes.  CT scan 08/24/2018 - indeterminate renal lesion left kidney 0.6cm, recommend repeat evaluation in one year    Past Medical History:  Diagnosis Date  . Arthritis   . Gout   . Hypertension   . Kidney disease      Past Surgical History:  Procedure Laterality Date  . ABDOMINAL HYSTERECTOMY    . ABDOMINAL SURGERY    . BREAST EXCISIONAL BIOPSY Right    benign  . KNEE SURGERY     History reviewed. No pertinent family history. Social History   Tobacco Use  .  Smoking status: Current Every Day Smoker  . Smokeless tobacco: Never Used  Substance Use Topics  . Alcohol use: No  . Drug use: No   Current Outpatient Medications  Medication Sig Dispense Refill  . allopurinol (ZYLOPRIM) 300 MG tablet Take 150-300 mg by mouth daily. Alternate from 1/2 and 1 tablet daily  0  . amLODipine (NORVASC) 5 MG tablet Take 5 mg by mouth daily.    . benazepril (LOTENSIN) 10 MG tablet Take 10 mg by mouth daily.  0  . LINZESS 145 MCG CAPS capsule TAKE 1 CAPSULE BY MOUTH ONCE DAILY BEFORE BREAKFAST (Patient taking differently: Take 145 mcg by mouth daily before breakfast. ) 30 capsule 0  . polyethylene glycol powder (GLYCOLAX/MIRALAX) powder Place 1 capful in 6 oz liquid and drink. Repeat this twice over the next 2 hours. 255 g 0  . VENTOLIN HFA 108 (90 Base) MCG/ACT inhaler Inhale 2 puffs into the lungs every 4 (four) hours as needed for shortness of breath.  3   No current facility-administered medications for this visit.    Allergies  Allergen Reactions  . Hydromorphone Hcl     REACTION: itching  . Naproxen Sodium   . Sulfonamide Derivatives   . Tramadol Hcl Other (See Comments)    Bloated and constipation      Review of Systems: All systems reviewed and negative except where noted  in HPI.   Lab Results  Component Value Date   CREATININE 1.20 (H) 08/24/2018   BUN 16 08/24/2018   NA 138 08/24/2018   K 4.7 08/24/2018   CL 105 08/24/2018   CO2 22 08/24/2018    Lab Results  Component Value Date   ALT 24 08/24/2018   AST 30 08/24/2018   ALKPHOS 77 08/24/2018   BILITOT 0.5 08/24/2018    Lab Results  Component Value Date   WBC 6.0 08/24/2018   HGB 15.3 (H) 08/24/2018   HCT 45.0 08/24/2018   MCV 92.7 08/24/2018   PLT 202 08/24/2018     Physical Exam: BP 134/82 (BP Location: Left Arm, Patient Position: Sitting, Cuff Size: Normal)   Pulse 68   Ht 4' 11.25" (1.505 m) Comment: height measured without shoes  Wt 170 lb (77.1 kg)   BMI 34.05  kg/m  Constitutional: Pleasant,well-developed, female in no acute distress. HEENT: Normocephalic and atraumatic. Conjunctivae are normal. No scleral icterus. Neck supple.  Cardiovascular: Normal rate, regular rhythm.  Pulmonary/chest: Effort normal and breath sounds normal. No wheezing, rales or rhonchi. Abdominal: Soft, nondistended, nontender. There are no masses palpable. No hepatomegaly. Extremities: no edema Lymphadenopathy: No cervical adenopathy noted. Neurological: Alert and oriented to person place and time. Skin: Skin is warm and dry. No rashes noted. Psychiatric: Normal mood and affect. Behavior is normal.   ASSESSMENT AND PLAN: 77 year old female here for reassessment following issues:  Constipation / left lower quadrant pain - ongoing severe constipation requiring regimen as outlined above. Now with intermittent left lower quadrant pain related to her bowel habits. Discussed with her how aggressive she wanted to be with this workup. Recent CT without clear abnormalities. I offered her colonoscopy given her worsening symptoms to ensure okay. After discussion of risks and benefits she wanted to proceed. In the interim we'll add low-dose Bentyl 10 mg to use as needed for pain and see if that helps. He'll continue her normal bowel regimen in the interim. Further recommendations pending the results.   Kristin Cellar, MD [facility]  CC: Kristin Carol, MD

## 2018-10-13 ENCOUNTER — Encounter: Payer: Self-pay | Admitting: Gastroenterology

## 2018-10-13 ENCOUNTER — Ambulatory Visit (AMBULATORY_SURGERY_CENTER): Payer: Medicare Other | Admitting: Gastroenterology

## 2018-10-13 VITALS — BP 149/79 | HR 55 | Temp 98.7°F | Resp 24 | Ht 59.25 in | Wt 170.0 lb

## 2018-10-13 DIAGNOSIS — K635 Polyp of colon: Secondary | ICD-10-CM | POA: Diagnosis not present

## 2018-10-13 DIAGNOSIS — K5900 Constipation, unspecified: Secondary | ICD-10-CM

## 2018-10-13 DIAGNOSIS — K219 Gastro-esophageal reflux disease without esophagitis: Secondary | ICD-10-CM | POA: Diagnosis not present

## 2018-10-13 DIAGNOSIS — I10 Essential (primary) hypertension: Secondary | ICD-10-CM | POA: Diagnosis not present

## 2018-10-13 DIAGNOSIS — K5730 Diverticulosis of large intestine without perforation or abscess without bleeding: Secondary | ICD-10-CM

## 2018-10-13 DIAGNOSIS — D127 Benign neoplasm of rectosigmoid junction: Secondary | ICD-10-CM | POA: Diagnosis not present

## 2018-10-13 MED ORDER — SODIUM CHLORIDE 0.9 % IV SOLN: 500.0000 mL | Freq: Once | INTRAVENOUS | Status: DC

## 2018-10-13 NOTE — Progress Notes (Signed)
Called to room to assist during endoscopic procedure.  Patient ID and intended procedure confirmed with present staff. Received instructions for my participation in the procedure from the performing physician.  

## 2018-10-13 NOTE — Progress Notes (Signed)
Report given to PACU, vss 

## 2018-10-13 NOTE — Progress Notes (Signed)
After patient was discharged home, we found her inhaler in the room.  I tried calling the daughter and left two messages.  Inhaler placed in cabinet in recovery room and will let patient know during follow up call tomorrow.

## 2018-10-13 NOTE — Op Note (Signed)
[facility] Patient Name: Kristin Davidson Procedure Date: 10/13/2018 2:25 PM MRN: [id number] Endoscopist: Remo Lipps P. Havery [name] , MD Age: 78 Referring MD:  Date of Birth: 10-Sep-1941 Gender: Female Account #: [id number] Procedure:                Colonoscopy Indications:              Change in bowel habits (recent onset severe                            constipation) Medicines:                Monitored Anesthesia Care Procedure:                Pre-Anesthesia Assessment:                           - Prior to the procedure, a History and Physical                            was performed, and patient medications and                            allergies were reviewed. The patient's tolerance of                            previous anesthesia was also reviewed. The risks                            and benefits of the procedure and the sedation                            options and risks were discussed with the patient.                            All questions were answered, and informed consent                            was obtained. Prior Anticoagulants: The patient has                            taken no previous anticoagulant or antiplatelet                            agents. ASA Grade Assessment: II - A patient with                            mild systemic disease. After reviewing the risks                            and benefits, the patient was deemed in                            satisfactory condition to undergo the procedure.  After obtaining informed consent, the colonoscope                            was passed under direct vision. Throughout the                            procedure, the patient's blood pressure, pulse, and                            oxygen saturations were monitored continuously. The                            Model [id number]-[id number]) scope was introduced                            through the anus and advanced to the the  cecum,                            identified by appendiceal orifice and ileocecal                            valve. The colonoscopy was performed without                            difficulty. The patient tolerated the procedure                            well. The quality of the bowel preparation was                            good. The terminal ileum, ileocecal valve,                            appendiceal orifice, and rectum were photographed. Scope In: 2:32:26 PM Scope Out: 2:75:17 PM Scope Withdrawal Time: 0 hours 11 minutes 50 seconds  Total Procedure Duration: 0 hours 13 minutes 52 seconds  Findings:                 The perianal and digital rectal examinations were                            normal.                           The terminal ileum appeared normal.                           A 5 mm polyp was found in the recto-sigmoid colon.                            The polyp was flat. The polyp was removed with a                            cold snare. Resection and retrieval were complete.  Multiple small-mouthed diverticula were found in                            the sigmoid colon.                           The exam was otherwise without abnormality. Complications:            No immediate complications. Estimated blood loss:                            Minimal. Estimated Blood Loss:     Estimated blood loss was minimal. Impression:               - The examined portion of the ileum was normal.                           - One 5 mm polyp at the recto-sigmoid colon,                            removed with a cold snare. Resected and retrieved.                           - Diverticulosis in the sigmoid colon.                           - The examination was otherwise normal. Recommendation:           - Patient has a contact number available for                            emergencies. The signs and symptoms of potential                            delayed  complications were discussed with the                            patient. Return to normal activities tomorrow.                            Written discharge instructions were provided to the                            patient.                           - Resume previous diet.                           - Continue present medications.                           - Await pathology results. Remo Lipps P. Stelios Kirby, MD 10/13/2018 2:51:56 PM This report has been signed electronically.

## 2018-10-13 NOTE — Patient Instructions (Signed)
Handouts provided:  [place]  YOU HAD AN ENDOSCOPIC PROCEDURE TODAY AT [facility]:   Refer to the procedure report that was given to you for any specific questions about what was found during the examination.  If the procedure report does not answer your questions, please call your gastroenterologist to clarify.  If you requested that your care partner not be given the details of your procedure findings, then the procedure report has been included in a sealed envelope for you to review at your convenience later.  YOU SHOULD EXPECT: Some feelings of bloating in the abdomen. Passage of more gas than usual.  Walking can help get rid of the air that was put into your GI tract during the procedure and reduce the bloating. If you had a lower endoscopy (such as a colonoscopy or flexible sigmoidoscopy) you may notice spotting of blood in your stool or on the toilet paper. If you underwent a bowel prep for your procedure, you may not have a normal bowel movement for a few days.  Please Note:  You might notice some irritation and congestion in your nose or some drainage.  This is from the oxygen used during your procedure.  There is no need for concern and it should clear up in a day or so.  SYMPTOMS TO REPORT IMMEDIATELY:   Following lower endoscopy (colonoscopy or flexible sigmoidoscopy):  Excessive amounts of blood in the stool  Significant tenderness or worsening of abdominal pains  Swelling of the abdomen that is new, acute  Fever of 100F or higher  For urgent or emergent issues, a gastroenterologist can be reached at any hour by calling [phone].   DIET:  We do recommend a small meal at first, but then you may proceed to your regular diet.  Drink plenty of fluids but you should avoid alcoholic beverages for 24 hours.  ACTIVITY:  You should plan to take it easy for the rest of today and you should NOT DRIVE or use heavy machinery until tomorrow (because of the sedation  medicines used during the test).    FOLLOW UP: Our staff will call the number listed on your records the next business day following your procedure to check on you and address any questions or concerns that you may have regarding the information given to you following your procedure. If we do not reach you, we will leave a message.  However, if you are feeling well and you are not experiencing any problems, there is no need to return our call.  We will assume that you have returned to your regular daily activities without incident.  If any biopsies were taken you will be contacted by phone or by letter within the next 1-3 weeks.  Please call us at [phone] if you have not heard about the biopsies in 3 weeks.    SIGNATURES/CONFIDENTIALITY: You and/or your care partner have signed paperwork which will be entered into your electronic medical record.  These signatures attest to the fact that that the information above on your After Visit Summary has been reviewed and is understood.  Full responsibility of the confidentiality of this discharge information lies with you and/or your care-partner.

## 2018-10-14 ENCOUNTER — Telehealth: Payer: Self-pay

## 2018-10-14 NOTE — Telephone Encounter (Signed)
No answer, left message to call back later today, B.Walda Hertzog RN. 

## 2018-10-14 NOTE — Telephone Encounter (Signed)
Follow up call x 2, left a voicemail. 

## 2018-10-14 NOTE — Telephone Encounter (Signed)
No answer, left message to call back later today, B.Eniyah Eastmond RN. 

## 2018-10-21 ENCOUNTER — Encounter: Payer: Self-pay | Admitting: Gastroenterology

## 2018-11-07 DIAGNOSIS — N183 Chronic kidney disease, stage 3 (moderate): Secondary | ICD-10-CM | POA: Diagnosis not present

## 2018-11-07 DIAGNOSIS — Z1389 Encounter for screening for other disorder: Secondary | ICD-10-CM | POA: Diagnosis not present

## 2018-11-07 DIAGNOSIS — K5900 Constipation, unspecified: Secondary | ICD-10-CM | POA: Diagnosis not present

## 2018-11-07 DIAGNOSIS — Z0000 Encounter for general adult medical examination without abnormal findings: Secondary | ICD-10-CM | POA: Diagnosis not present

## 2018-11-07 DIAGNOSIS — Z7189 Other specified counseling: Secondary | ICD-10-CM | POA: Diagnosis not present

## 2018-11-07 DIAGNOSIS — I10 Essential (primary) hypertension: Secondary | ICD-10-CM | POA: Diagnosis not present

## 2018-11-07 DIAGNOSIS — E1169 Type 2 diabetes mellitus with other specified complication: Secondary | ICD-10-CM | POA: Diagnosis not present

## 2019-01-29 ENCOUNTER — Other Ambulatory Visit: Payer: Self-pay | Admitting: Internal Medicine

## 2019-01-29 DIAGNOSIS — Z1231 Encounter for screening mammogram for malignant neoplasm of breast: Secondary | ICD-10-CM

## 2019-02-18 ENCOUNTER — Ambulatory Visit: Payer: Medicare Other | Admitting: Nurse Practitioner

## 2019-03-27 ENCOUNTER — Ambulatory Visit
Admission: RE | Admit: 2019-03-27 | Discharge: 2019-03-27 | Disposition: A | Discharge status: Home / Self Care | Payer: Medicare Other | Source: Ambulatory Visit | Attending: Internal Medicine | Admitting: Internal Medicine

## 2019-03-27 ENCOUNTER — Other Ambulatory Visit: Payer: Self-pay

## 2019-03-27 DIAGNOSIS — Z1231 Encounter for screening mammogram for malignant neoplasm of breast: Secondary | ICD-10-CM

## 2019-05-14 ENCOUNTER — Other Ambulatory Visit: Payer: Self-pay

## 2019-05-14 DIAGNOSIS — Z20822 Contact with and (suspected) exposure to covid-19: Secondary | ICD-10-CM

## 2019-05-15 LAB — NOVEL CORONAVIRUS, NAA: SARS-CoV-2, NAA: NOT DETECTED

## 2019-05-15 LAB — SPECIMEN STATUS REPORT

## 2020-02-17 ENCOUNTER — Other Ambulatory Visit: Payer: Self-pay | Admitting: Internal Medicine

## 2020-02-17 DIAGNOSIS — Z1231 Encounter for screening mammogram for malignant neoplasm of breast: Secondary | ICD-10-CM

## 2020-03-29 ENCOUNTER — Ambulatory Visit: Payer: Medicare Other

## 2020-04-01 ENCOUNTER — Ambulatory Visit: Payer: Medicare Other

## 2020-04-07 DIAGNOSIS — I4891 Unspecified atrial fibrillation: Secondary | ICD-10-CM

## 2020-04-07 HISTORY — DX: Unspecified atrial fibrillation: I48.91

## 2020-04-08 ENCOUNTER — Ambulatory Visit
Admission: RE | Admit: 2020-04-08 | Discharge: 2020-04-08 | Disposition: A | Discharge status: Home / Self Care | Payer: Medicare Other | Source: Ambulatory Visit | Attending: Internal Medicine | Admitting: Internal Medicine

## 2020-04-08 ENCOUNTER — Ambulatory Visit: Payer: Medicare Other

## 2020-04-08 ENCOUNTER — Other Ambulatory Visit: Payer: Self-pay

## 2020-04-08 DIAGNOSIS — Z1231 Encounter for screening mammogram for malignant neoplasm of breast: Secondary | ICD-10-CM

## 2020-05-07 ENCOUNTER — Inpatient Hospital Stay (HOSPITAL_COMMUNITY)
Admission: EM | Admit: 2020-05-07 | Discharge: 2020-05-10 | DRG: 392 | Disposition: A | Discharge status: Home Health | Payer: Medicare Other | Attending: Internal Medicine | Admitting: Internal Medicine

## 2020-05-07 ENCOUNTER — Encounter (HOSPITAL_COMMUNITY): Payer: Self-pay

## 2020-05-07 ENCOUNTER — Emergency Department (HOSPITAL_COMMUNITY): Payer: Medicare Other

## 2020-05-07 ENCOUNTER — Other Ambulatory Visit: Payer: Self-pay

## 2020-05-07 DIAGNOSIS — K2100 Gastro-esophageal reflux disease with esophagitis, without bleeding: Secondary | ICD-10-CM | POA: Diagnosis not present

## 2020-05-07 DIAGNOSIS — R001 Bradycardia, unspecified: Secondary | ICD-10-CM | POA: Diagnosis not present

## 2020-05-07 DIAGNOSIS — F29 Unspecified psychosis not due to a substance or known physiological condition: Secondary | ICD-10-CM | POA: Diagnosis present

## 2020-05-07 DIAGNOSIS — I452 Bifascicular block: Secondary | ICD-10-CM | POA: Diagnosis present

## 2020-05-07 DIAGNOSIS — Z885 Allergy status to narcotic agent status: Secondary | ICD-10-CM

## 2020-05-07 DIAGNOSIS — N179 Acute kidney failure, unspecified: Secondary | ICD-10-CM | POA: Diagnosis present

## 2020-05-07 DIAGNOSIS — Z808 Family history of malignant neoplasm of other organs or systems: Secondary | ICD-10-CM

## 2020-05-07 DIAGNOSIS — Z888 Allergy status to other drugs, medicaments and biological substances status: Secondary | ICD-10-CM

## 2020-05-07 DIAGNOSIS — M1990 Unspecified osteoarthritis, unspecified site: Secondary | ICD-10-CM | POA: Diagnosis present

## 2020-05-07 DIAGNOSIS — N1831 Chronic kidney disease, stage 3a: Secondary | ICD-10-CM | POA: Diagnosis present

## 2020-05-07 DIAGNOSIS — E876 Hypokalemia: Secondary | ICD-10-CM | POA: Diagnosis present

## 2020-05-07 DIAGNOSIS — K529 Noninfective gastroenteritis and colitis, unspecified: Secondary | ICD-10-CM | POA: Diagnosis present

## 2020-05-07 DIAGNOSIS — R079 Chest pain, unspecified: Secondary | ICD-10-CM

## 2020-05-07 DIAGNOSIS — Z20822 Contact with and (suspected) exposure to covid-19: Secondary | ICD-10-CM | POA: Diagnosis present

## 2020-05-07 DIAGNOSIS — I10 Essential (primary) hypertension: Secondary | ICD-10-CM | POA: Diagnosis present

## 2020-05-07 DIAGNOSIS — R778 Other specified abnormalities of plasma proteins: Secondary | ICD-10-CM | POA: Diagnosis present

## 2020-05-07 DIAGNOSIS — Z882 Allergy status to sulfonamides status: Secondary | ICD-10-CM

## 2020-05-07 DIAGNOSIS — J449 Chronic obstructive pulmonary disease, unspecified: Secondary | ICD-10-CM | POA: Diagnosis present

## 2020-05-07 DIAGNOSIS — R109 Unspecified abdominal pain: Secondary | ICD-10-CM | POA: Diagnosis present

## 2020-05-07 DIAGNOSIS — I495 Sick sinus syndrome: Secondary | ICD-10-CM

## 2020-05-07 DIAGNOSIS — R0789 Other chest pain: Secondary | ICD-10-CM | POA: Diagnosis not present

## 2020-05-07 DIAGNOSIS — R112 Nausea with vomiting, unspecified: Secondary | ICD-10-CM | POA: Diagnosis present

## 2020-05-07 DIAGNOSIS — M109 Gout, unspecified: Secondary | ICD-10-CM | POA: Diagnosis present

## 2020-05-07 DIAGNOSIS — Z8249 Family history of ischemic heart disease and other diseases of the circulatory system: Secondary | ICD-10-CM

## 2020-05-07 DIAGNOSIS — E860 Dehydration: Secondary | ICD-10-CM | POA: Diagnosis present

## 2020-05-07 DIAGNOSIS — Z79899 Other long term (current) drug therapy: Secondary | ICD-10-CM

## 2020-05-07 DIAGNOSIS — I130 Hypertensive heart and chronic kidney disease with heart failure and stage 1 through stage 4 chronic kidney disease, or unspecified chronic kidney disease: Secondary | ICD-10-CM | POA: Diagnosis not present

## 2020-05-07 DIAGNOSIS — I5030 Unspecified diastolic (congestive) heart failure: Secondary | ICD-10-CM | POA: Diagnosis not present

## 2020-05-07 DIAGNOSIS — Z87891 Personal history of nicotine dependence: Secondary | ICD-10-CM

## 2020-05-07 LAB — URINALYSIS, ROUTINE W REFLEX MICROSCOPIC
Bilirubin Urine: NEGATIVE
Glucose, UA: NEGATIVE mg/dL
Hgb urine dipstick: NEGATIVE
Ketones, ur: NEGATIVE mg/dL
Leukocytes,Ua: NEGATIVE
Nitrite: NEGATIVE
Protein, ur: NEGATIVE mg/dL
Specific Gravity, Urine: 1.014 (ref 1.005–1.030)
pH: 6 (ref 5.0–8.0)

## 2020-05-07 LAB — COMPREHENSIVE METABOLIC PANEL
ALT: 32 U/L (ref 0–44)
AST: 40 U/L (ref 15–41)
Albumin: 3.8 g/dL (ref 3.5–5.0)
Alkaline Phosphatase: 82 U/L (ref 38–126)
Anion gap: 9 (ref 5–15)
BUN: 19 mg/dL (ref 8–23)
CO2: 28 mmol/L (ref 22–32)
Calcium: 8.7 mg/dL — ABNORMAL LOW (ref 8.9–10.3)
Chloride: 110 mmol/L (ref 98–111)
Creatinine, Ser: 1.48 mg/dL — ABNORMAL HIGH (ref 0.44–1.00)
GFR calc Af Amer: 39 mL/min — ABNORMAL LOW (ref >60)
GFR calc non Af Amer: 34 mL/min — ABNORMAL LOW (ref >60)
Glucose, Bld: 129 mg/dL — ABNORMAL HIGH (ref 70–99)
Potassium: 3.6 mmol/L (ref 3.5–5.1)
Sodium: 147 mmol/L — ABNORMAL HIGH (ref 135–145)
Total Bilirubin: 0.4 mg/dL (ref 0.3–1.2)
Total Protein: 6.8 g/dL (ref 6.5–8.1)

## 2020-05-07 LAB — CBC
HCT: 45.1 % (ref 36.0–46.0)
Hemoglobin: 13.3 g/dL (ref 12.0–15.0)
MCH: 27.3 pg (ref 26.0–34.0)
MCHC: 29.5 g/dL — ABNORMAL LOW (ref 30.0–36.0)
MCV: 92.4 fL (ref 80.0–100.0)
Platelets: 161 10*3/uL (ref 150–400)
RBC: 4.88 MIL/uL (ref 3.87–5.11)
RDW: 13.3 % (ref 11.5–15.5)
WBC: 13.9 10*3/uL — ABNORMAL HIGH (ref 4.0–10.5)
nRBC: 0 % (ref 0.0–0.2)

## 2020-05-07 LAB — TROPONIN I (HIGH SENSITIVITY): Troponin I (High Sensitivity): 20 ng/L — ABNORMAL HIGH (ref <18)

## 2020-05-07 LAB — LIPASE, BLOOD: Lipase: 68 U/L — ABNORMAL HIGH (ref 11–51)

## 2020-05-07 MED ORDER — ONDANSETRON HCL 4 MG/2ML IJ SOLN
4.0000 mg | Freq: Once | INTRAMUSCULAR | Status: AC
  Administered 2020-05-07: 4 mg via INTRAVENOUS
  Filled 2020-05-07: qty 2

## 2020-05-07 MED ORDER — MORPHINE SULFATE (PF) 2 MG/ML IV SOLN
2.0000 mg | Freq: Once | INTRAVENOUS | Status: AC
  Administered 2020-05-07: 2 mg via INTRAVENOUS
  Filled 2020-05-07: qty 1

## 2020-05-07 MED ORDER — IOHEXOL 300 MG/ML  SOLN
80.0000 mL | Freq: Once | INTRAMUSCULAR | Status: AC | PRN
  Administered 2020-05-07: 80 mL via INTRAVENOUS

## 2020-05-07 MED ORDER — ALUM & MAG HYDROXIDE-SIMETH 200-200-20 MG/5ML PO SUSP
30.0000 mL | Freq: Once | ORAL | Status: AC
  Administered 2020-05-07: 30 mL via ORAL
  Filled 2020-05-07: qty 30

## 2020-05-07 MED ORDER — SODIUM CHLORIDE 0.9% FLUSH: 3.0000 mL | Freq: Once | INTRAVENOUS | Status: DC

## 2020-05-07 MED ORDER — SODIUM CHLORIDE 0.9 % IV BOLUS
1000.0000 mL | Freq: Once | INTRAVENOUS | Status: AC
  Administered 2020-05-07: 1000 mL via INTRAVENOUS

## 2020-05-07 NOTE — ED Notes (Signed)
Patient transported to CT 

## 2020-05-07 NOTE — ED Notes (Signed)
Spoke with daughter via phone.  Patient gave verbal permission to update daughter.

## 2020-05-07 NOTE — ED Triage Notes (Signed)
Per [facility] pt from home, became nauseous and vomited x3 after eating fish from [facility]. Upon EMS assessment pt found to be bradycardic with of 40-42  20g LAC, received 300cc NS, HR now 60's 4mg  Zofran IVP   BP 169/69 HR 63 RR 18 95% RA  CBG 148

## 2020-05-07 NOTE — ED Provider Notes (Signed)
[facility] DEPART[facility] Provider Note   CSN: [id number] Arrival date & time: 05/07/20  1728     History Chief Complaint  Patient presents with  . Nausea  . Emesis  . Bradycardia    Kristin Davidson is a 79 y.o. female.  79 yo F with a chief complaints of abdominal pain.  Patient had fish and about 20 minutes after she had she felt sick to her stomach and ended up vomiting a few times.  Had some pain to her lower abdomen into her chest.  Described as sharp.  Improved with vomiting.  Received some medication with EMS and the patient feels much better on arrival here.  Still feels that her stomach is little bit sore.  Denies fevers.  Prior to this event she denies any abdominal pain denies chest pain denies shortness of breath has been eating and drinking normally.  Has never had a thing like this happen to her before.  The history is provided by the patient.  Emesis Associated symptoms: abdominal pain   Associated symptoms: no arthralgias, no chills, no fever, no headaches and no myalgias   Illness Severity:  Moderate Onset quality:  Gradual Duration:  20 minutes Timing:  Constant Progression:  Unchanged Chronicity:  New Associated symptoms: abdominal pain, chest pain, nausea and vomiting   Associated symptoms: no congestion, no fever, no headaches, no myalgias, no rhinorrhea, no shortness of breath and no wheezing        Past Medical History:  Diagnosis Date  . Arthritis   . COPD (chronic obstructive pulmonary disease) ([place])   . GERD (gastroesophageal reflux disease)   . Gout   . Hypertension   . Kidney disease     Patient Active Problem List   Diagnosis Date Noted  . 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 HYPERTENSION 08/06/2007  . ALLERGIC RHINITIS 08/06/2007  . GERD 08/06/2007  . BREAST CYST 08/06/2007  . 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 ABDOMINAL HYSTERECTOMY    . ABDOMINAL  SURGERY    . BREAST EXCISIONAL BIOPSY Right >10+ yrs ago   benign  . COLONOSCOPY    . KNEE SURGERY       OB History   No obstetric history on file.     Family History  Problem Relation Age of Onset  . Bone cancer Mother   . Colon cancer Neg Hx   . Esophageal cancer Neg Hx   . Rectal cancer Neg Hx   . Stomach cancer Neg Hx     Social History   Tobacco Use  . Smoking status: Former Smoker    Types: Cigarettes    Quit date: 06/08/2018    Years since quitting: 1.9  . Smokeless tobacco: Never Used  Vaping Use  . Vaping Use: Never used  Substance Use Topics  . Alcohol use: No  . Drug use: No    Home Medications Prior to Admission medications   Medication Sig Start Date End Date Taking? Authorizing Provider  allopurinol (ZYLOPRIM) 300 MG tablet Take 150-300 mg by mouth daily. Alternate from 1/2 and 1 tablet daily 05/07/16   [provider]  amLODipine (NORVASC) 5 MG tablet Take 5 mg by mouth daily.    [provider]  benazepril (LOTENSIN) 10 MG tablet Take 10 mg by mouth daily. 03/09/16   [provider]  dicyclomine (BENTYL) 10 MG capsule Take 1 capsule (10 mg total) by mouth every 8 (  eight) hours as needed for spasms. 10/03/18   Armbruster, Carlota Raspberry, MD  LINZESS 145 MCG CAPS capsule TAKE 1 CAPSULE BY MOUTH ONCE DAILY BEFORE BREAKFAST Patient taking differently: Take 145 mcg by mouth daily before breakfast.  01/23/18   Levin Erp, PA  polyethylene glycol powder (GLYCOLAX/MIRALAX) powder Place 1 capful in 6 oz liquid and drink. Repeat this twice over the next 2 hours. 05/24/16   Janne Napoleon, NP  VENTOLIN HFA 108 (90 Base) MCG/ACT inhaler Inhale 2 puffs into the lungs every 4 (four) hours as needed for shortness of breath. 06/23/18   [provider]    Allergies    Hydromorphone hcl, Naproxen sodium, Sulfonamide derivatives, and Tramadol hcl  Review of Systems   Review of Systems  Constitutional: Negative for chills and fever.    HENT: Negative for congestion and rhinorrhea.   Eyes: Negative for redness and visual disturbance.  Respiratory: Negative for shortness of breath and wheezing.   Cardiovascular: Positive for chest pain. Negative for palpitations.  Gastrointestinal: Positive for abdominal pain, nausea and vomiting.  Genitourinary: Negative for dysuria and urgency.  Musculoskeletal: Negative for arthralgias and myalgias.  Skin: Negative for pallor and wound.  Neurological: Negative for dizziness and headaches.    Physical Exam Updated Vital Signs BP (!) 150/71   Pulse 56   Temp 97.8 F (36.6 C) (Temporal)   Resp (!) 25   Ht 5\' 1"  (1.549 m)   Wt 64.9 kg   SpO2 96%   BMI 27.02 kg/m   Physical Exam Vitals and nursing note reviewed.  Constitutional:      General: She is not in acute distress.    Appearance: She is well-developed. She is not diaphoretic.  HENT:     Head: Normocephalic and atraumatic.  Eyes:     Pupils: Pupils are equal, round, and reactive to light.  Cardiovascular:     Rate and Rhythm: Normal rate and regular rhythm.     Heart sounds: No murmur heard.  No friction rub. No gallop.   Pulmonary:     Effort: Pulmonary effort is normal.     Breath sounds: No wheezing or rales.  Abdominal:     General: There is no distension.     Palpations: Abdomen is soft.     Tenderness: There is no abdominal tenderness.     Comments: Benign abdominal exam  Musculoskeletal:        General: No tenderness.     Cervical back: Normal range of motion and neck supple.  Skin:    General: Skin is warm and dry.  Neurological:     Mental Status: She is alert and oriented to person, place, and time.  Psychiatric:        Behavior: Behavior normal.     ED Results / Procedures / Treatments   Labs (all labs ordered are listed, but only abnormal results are displayed) Labs Reviewed  LIPASE, BLOOD - Abnormal; Notable for the following components:      Result Value   Lipase 68 (*)    All other  components within normal limits  COMPREHENSIVE METABOLIC PANEL - Abnormal; Notable for the following components:   Sodium 147 (*)    Glucose, Bld 129 (*)    Creatinine, Ser 1.48 (*)    Calcium 8.7 (*)    GFR calc non Af Amer 34 (*)    GFR calc Af Amer 39 (*)    All other components within normal limits  CBC - Abnormal;  Notable for the following components:   WBC 13.9 (*)    MCHC 29.5 (*)    All other components within normal limits  URINALYSIS, ROUTINE W REFLEX MICROSCOPIC - Abnormal; Notable for the following components:   Color, Urine STRAW (*)    All other components within normal limits  TROPONIN I (HIGH SENSITIVITY) - Abnormal; Notable for the following components:   Troponin I (High Sensitivity) 20 (*)    All other components within normal limits  TROPONIN I (HIGH SENSITIVITY)    EKG EKG Interpretation  Date/Time:  Saturday May 07 2020 17:39:01 EDT Ventricular Rate:  80 PR Interval:    QRS Duration: 134 QT Interval:  401 QTC Calculation: 463 R Axis:   96 Text Interpretation: Sinus rhythm Short PR interval Consider left atrial enlargement RBBB and LPFB Inferior infarct, age indeterminate No old tracing to compare Confirmed by Deno Etienne [phone]) on 05/07/2020 5:48:11 PM   Radiology CT ABDOMEN PELVIS W CONTRAST  Result Date: 05/07/2020 CLINICAL DATA:  Abdominal pain EXAM: CT ABDOMEN AND PELVIS WITH CONTRAST TECHNIQUE: Multidetector CT imaging of the abdomen and pelvis was performed using the standard protocol following bolus administration of intravenous contrast. CONTRAST:  15mL OMNIPAQUE IOHEXOL 300 MG/ML  SOLN COMPARISON:  CT abdomen dated 08/24/2018. FINDINGS: Lower chest: No acute abnormality. Hepatobiliary: Liver is diffusely low in density suggesting fatty infiltration. No focal liver abnormality. Gallbladder is contracted but otherwise unremarkable. No bile duct dilatation seen. Pancreas: Unremarkable. No pancreatic ductal dilatation or surrounding inflammatory  changes. Spleen: Normal in size without focal abnormality. Adrenals/Urinary Tract: Adrenal glands appear normal. Kidneys are unremarkable without mass, stone or hydronephrosis. No ureteral or bladder calculi identified. Bladder appears normal, partially decompressed. Stomach/Bowel: No dilated large or small bowel loops. No evidence of bowel wall inflammation. Fluid is seen throughout the large bowel which can be a secondary sign of underlying gastroenteritis or colitis. Appendix is not seen but there are no inflammatory changes about the cecum to suggest acute appendicitis. Vascular/Lymphatic: Aortic atherosclerosis. No acute appearing vascular abnormality. No enlarged lymph nodes seen. Reproductive: Presumed hysterectomy.  No adnexal mass or free fluid. Other: No free fluid or abscess collection seen. No free intraperitoneal air. Musculoskeletal: No acute or suspicious osseous finding. Degenerative spondylosis of the lumbar spine, mild to moderate in degree. IMPRESSION: 1. Fluid is seen throughout the large bowel which can be a secondary sign of underlying gastroenteritis or colitis. However, no bowel wall thickening or mesenteric inflammation to confirm an enteritis or colitis. No bowel obstruction. 2. Fatty infiltration of the liver. 3. No other acute or significant findings within the abdomen or pelvis. Aortic Atherosclerosis (ICD10-I70.0). Electronically Signed   By: Franki Cabot M.D.   On: 05/07/2020 19:07    Procedures Procedures (including critical care time)  Medications Ordered in ED Medications  sodium chloride flush (NS) 0.9 % injection 3 mL (3 mLs Intravenous Not Given 05/07/20 1905)  ondansetron (ZOFRAN) injection 4 mg (4 mg Intravenous Given 05/07/20 1828)  alum & mag hydroxide-simeth (MAALOX/MYLANTA) 200-200-20 MG/5ML suspension 30 mL (30 mLs Oral Given 05/07/20 1830)  sodium chloride 0.9 % bolus 1,000 mL (1,000 mLs Intravenous New Bag/Given 05/07/20 1829)  iohexol (OMNIPAQUE) 300 MG/ML  solution 80 mL (80 mLs Intravenous Contrast Given 05/07/20 1839)  morphine 2 MG/ML injection 2 mg (2 mg Intravenous Given 05/07/20 2219)  ondansetron (ZOFRAN) injection 4 mg (4 mg Intravenous Given 05/07/20 2219)    ED Course  I have reviewed the triage vital signs and the nursing notes.  Pertinent labs & imaging results that were available during my care of the patient were reviewed by me and considered in my medical decision making (see chart for details).    MDM Rules/Calculators/A&P                          79 yo F with a chief complaints of abdominal pain nausea and vomiting after eating fried seafood.  Is now resolved after her episodes of emesis.  Per EMS she had a an episode of bradycardia during her vomiting.  On my exam she has a benign abdominal exam.  No old EKGs in the system that I can see.  She does have flipped T waves in the inferior leads.  Will obtain a delta troponin laboratory evaluation and CT scan of the abdomen pelvis reassess.  CT scan with possible early colitis is the read.  Nonspecific.  Patient is feeling better and would like to try and eat.  She was able to tolerate some liquids will try some solids.  Initial troponin is positive but mildly so.  We will repeat a second.  Signed out to Dr. Randal Buba.  Please see her note for further details care in the ED.  The patients results and plan were reviewed and discussed.   Any x-rays performed were independently reviewed by myself.   Differential diagnosis were considered with the presenting HPI.  Medications  sodium chloride flush (NS) 0.9 % injection 3 mL (3 mLs Intravenous Not Given 05/07/20 1905)  ondansetron [facility]) injection 4 mg (4 mg Intravenous Given 05/07/20 1828)  alum & mag hydroxide-simeth (MAALOX/MYLANTA) 200-200-20 MG/5ML suspension 30 mL (30 mLs Oral Given 05/07/20 1830)  sodium chloride 0.9 % bolus 1,000 mL (1,000 mLs Intravenous New Bag/Given 05/07/20 1829)  iohexol (OMNIPAQUE) 300 MG/ML solution 80 mL  (80 mLs Intravenous Contrast Given 05/07/20 1839)  morphine 2 MG/ML injection 2 mg (2 mg Intravenous Given 05/07/20 2219)  ondansetron (ZOFRAN) injection 4 mg (4 mg Intravenous Given 05/07/20 2219)    Vitals:   05/07/20 1830 05/07/20 2030 05/07/20 2131 05/07/20 2230  BP: (!) 193/74 (!) 165/88 (!) 142/86 (!) 150/71  Pulse: 56 57 55 56  Resp: 16 23 23  (!) 25  Temp:      TempSrc:      SpO2: 96% 95% 96% 96%  Weight:      Height:        Final diagnoses:  Nausea and vomiting in adult  Chest pain in adult      Final Clinical Impression(s) / ED Diagnoses Final diagnoses:  Nausea and vomiting in adult  Chest pain in adult    Rx / DC Orders ED Discharge Orders    None       Deno Etienne, DO 05/07/20 2336

## 2020-05-08 ENCOUNTER — Encounter (HOSPITAL_COMMUNITY): Payer: Self-pay | Admitting: Internal Medicine

## 2020-05-08 ENCOUNTER — Observation Stay (HOSPITAL_COMMUNITY): Payer: Medicare Other

## 2020-05-08 DIAGNOSIS — K529 Noninfective gastroenteritis and colitis, unspecified: Secondary | ICD-10-CM

## 2020-05-08 DIAGNOSIS — M109 Gout, unspecified: Secondary | ICD-10-CM

## 2020-05-08 DIAGNOSIS — R0789 Other chest pain: Secondary | ICD-10-CM | POA: Diagnosis not present

## 2020-05-08 DIAGNOSIS — R112 Nausea with vomiting, unspecified: Secondary | ICD-10-CM | POA: Diagnosis not present

## 2020-05-08 DIAGNOSIS — I129 Hypertensive chronic kidney disease with stage 1 through stage 4 chronic kidney disease, or unspecified chronic kidney disease: Secondary | ICD-10-CM

## 2020-05-08 DIAGNOSIS — R001 Bradycardia, unspecified: Secondary | ICD-10-CM

## 2020-05-08 DIAGNOSIS — J449 Chronic obstructive pulmonary disease, unspecified: Secondary | ICD-10-CM

## 2020-05-08 DIAGNOSIS — N182 Chronic kidney disease, stage 2 (mild): Secondary | ICD-10-CM

## 2020-05-08 HISTORY — DX: Other chest pain: R07.89

## 2020-05-08 LAB — CBC WITH DIFFERENTIAL/PLATELET
Abs Immature Granulocytes: 0.02 10*3/uL (ref 0.00–0.07)
Basophils Absolute: 0 10*3/uL (ref 0.0–0.1)
Basophils Relative: 0 %
Eosinophils Absolute: 0.2 10*3/uL (ref 0.0–0.5)
Eosinophils Relative: 3 %
HCT: 42 % (ref 36.0–46.0)
Hemoglobin: 12.7 g/dL (ref 12.0–15.0)
Immature Granulocytes: 0 %
Lymphocytes Relative: 25 %
Lymphs Abs: 1.7 10*3/uL (ref 0.7–4.0)
MCH: 27.4 pg (ref 26.0–34.0)
MCHC: 30.2 g/dL (ref 30.0–36.0)
MCV: 90.7 fL (ref 80.0–100.0)
Monocytes Absolute: 0.7 10*3/uL (ref 0.1–1.0)
Monocytes Relative: 10 %
Neutro Abs: 4.3 10*3/uL (ref 1.7–7.7)
Neutrophils Relative %: 62 %
Platelets: 136 10*3/uL — ABNORMAL LOW (ref 150–400)
RBC: 4.63 MIL/uL (ref 3.87–5.11)
RDW: 13.2 % (ref 11.5–15.5)
WBC: 6.8 10*3/uL (ref 4.0–10.5)
nRBC: 0 % (ref 0.0–0.2)

## 2020-05-08 LAB — BASIC METABOLIC PANEL
Anion gap: 12 (ref 5–15)
BUN: 13 mg/dL (ref 8–23)
CO2: 23 mmol/L (ref 22–32)
Calcium: 8.2 mg/dL — ABNORMAL LOW (ref 8.9–10.3)
Chloride: 109 mmol/L (ref 98–111)
Creatinine, Ser: 1.21 mg/dL — ABNORMAL HIGH (ref 0.44–1.00)
GFR calc Af Amer: 50 mL/min — ABNORMAL LOW (ref >60)
GFR calc non Af Amer: 43 mL/min — ABNORMAL LOW (ref >60)
Glucose, Bld: 111 mg/dL — ABNORMAL HIGH (ref 70–99)
Potassium: 3.4 mmol/L — ABNORMAL LOW (ref 3.5–5.1)
Sodium: 144 mmol/L (ref 135–145)

## 2020-05-08 LAB — MAGNESIUM: Magnesium: 2.1 mg/dL (ref 1.7–2.4)

## 2020-05-08 LAB — SARS CORONAVIRUS 2 BY RT PCR (HOSPITAL ORDER, PERFORMED IN ~~LOC~~ HOSPITAL LAB): SARS Coronavirus 2: NEGATIVE

## 2020-05-08 LAB — LIPID PANEL
Cholesterol: 125 mg/dL (ref 0–200)
HDL: 38 mg/dL — ABNORMAL LOW (ref >40)
LDL Cholesterol: 69 mg/dL (ref 0–99)
Total CHOL/HDL Ratio: 3.3 RATIO
Triglycerides: 90 mg/dL (ref <150)
VLDL: 18 mg/dL (ref 0–40)

## 2020-05-08 LAB — TROPONIN I (HIGH SENSITIVITY)
Troponin I (High Sensitivity): 29 ng/L — ABNORMAL HIGH (ref <18)
Troponin I (High Sensitivity): 24 ng/L — ABNORMAL HIGH (ref <18)
Troponin I (High Sensitivity): 22 ng/L — ABNORMAL HIGH (ref <18)
Troponin I (High Sensitivity): 23 ng/L — ABNORMAL HIGH (ref <18)

## 2020-05-08 LAB — LIPASE, BLOOD: Lipase: 36 U/L (ref 11–51)

## 2020-05-08 MED ORDER — AMLODIPINE BESYLATE 5 MG PO TABS
5.0000 mg | ORAL_TABLET | Freq: Every day | ORAL | Status: DC
  Administered 2020-05-08: 5 mg via ORAL
  Filled 2020-05-08: qty 1

## 2020-05-08 MED ORDER — DICYCLOMINE HCL 10 MG PO CAPS
10.0000 mg | ORAL_CAPSULE | Freq: Three times a day (TID) | ORAL | Status: DC | PRN
  Administered 2020-05-09 – 2020-05-10 (×2): 10 mg via ORAL
  Filled 2020-05-08 (×2): qty 1

## 2020-05-08 MED ORDER — ONDANSETRON HCL 4 MG/2ML IJ SOLN: 4.0000 mg | Freq: Four times a day (QID) | INTRAMUSCULAR | Status: DC | PRN

## 2020-05-08 MED ORDER — METRONIDAZOLE IN NACL 5-0.79 MG/ML-% IV SOLN
500.0000 mg | Freq: Once | INTRAVENOUS | Status: AC
  Administered 2020-05-08: 500 mg via INTRAVENOUS
  Filled 2020-05-08: qty 100

## 2020-05-08 MED ORDER — ALLOPURINOL 300 MG PO TABS: 150.0000 mg | ORAL_TABLET | Freq: Every day | ORAL | Status: DC

## 2020-05-08 MED ORDER — POTASSIUM CHLORIDE CRYS ER 20 MEQ PO TBCR
40.0000 meq | EXTENDED_RELEASE_TABLET | Freq: Once | ORAL | Status: AC
  Administered 2020-05-08: 40 meq via ORAL
  Filled 2020-05-08: qty 2

## 2020-05-08 MED ORDER — SODIUM CHLORIDE 0.9 % IV SOLN: 1.0000 g | INTRAVENOUS | Status: DC

## 2020-05-08 MED ORDER — ALBUTEROL SULFATE HFA 108 (90 BASE) MCG/ACT IN AERS
2.0000 | INHALATION_SPRAY | RESPIRATORY_TRACT | Status: DC | PRN
  Filled 2020-05-08: qty 6.7

## 2020-05-08 MED ORDER — ALLOPURINOL 300 MG PO TABS
150.0000 mg | ORAL_TABLET | ORAL | Status: DC
  Administered 2020-05-08 – 2020-05-10 (×2): 150 mg via ORAL
  Filled 2020-05-08: qty 1
  Filled 2020-05-08: qty 0.5
  Filled 2020-05-08 (×2): qty 1

## 2020-05-08 MED ORDER — ALBUTEROL SULFATE (2.5 MG/3ML) 0.083% IN NEBU: 2.5000 mg | INHALATION_SOLUTION | RESPIRATORY_TRACT | Status: DC | PRN

## 2020-05-08 MED ORDER — SODIUM CHLORIDE 0.9 % IV SOLN
2.0000 g | INTRAVENOUS | Status: DC
  Administered 2020-05-09 – 2020-05-10 (×2): 2 g via INTRAVENOUS
  Filled 2020-05-08 (×3): qty 20

## 2020-05-08 MED ORDER — SODIUM CHLORIDE 0.9 % IV SOLN
INTRAVENOUS | Status: DC | PRN
  Administered 2020-05-08: 250 mL via INTRAVENOUS

## 2020-05-08 MED ORDER — SODIUM CHLORIDE 0.9 % IV SOLN
1.0000 g | Freq: Once | INTRAVENOUS | Status: AC
  Administered 2020-05-08: 1 g via INTRAVENOUS
  Filled 2020-05-08: qty 10

## 2020-05-08 MED ORDER — ACETAMINOPHEN 325 MG PO TABS
650.0000 mg | ORAL_TABLET | ORAL | Status: DC | PRN
  Administered 2020-05-08: 650 mg via ORAL
  Filled 2020-05-08: qty 2

## 2020-05-08 MED ORDER — METRONIDAZOLE IN NACL 5-0.79 MG/ML-% IV SOLN: 500.0000 mg | Freq: Three times a day (TID) | INTRAVENOUS | Status: DC

## 2020-05-08 MED ORDER — ENOXAPARIN SODIUM 30 MG/0.3ML ~~LOC~~ SOLN
30.0000 mg | Freq: Every day | SUBCUTANEOUS | Status: DC
  Administered 2020-05-08 – 2020-05-09 (×2): 30 mg via SUBCUTANEOUS
  Filled 2020-05-08 (×2): qty 0.3

## 2020-05-08 MED ORDER — BENAZEPRIL HCL 5 MG PO TABS
10.0000 mg | ORAL_TABLET | Freq: Every day | ORAL | Status: DC
  Administered 2020-05-08 – 2020-05-10 (×3): 10 mg via ORAL
  Filled 2020-05-08 (×3): qty 2

## 2020-05-08 MED ORDER — METRONIDAZOLE IN NACL 5-0.79 MG/ML-% IV SOLN
500.0000 mg | Freq: Three times a day (TID) | INTRAVENOUS | Status: DC
  Administered 2020-05-08 – 2020-05-10 (×6): 500 mg via INTRAVENOUS
  Filled 2020-05-08 (×6): qty 100

## 2020-05-08 MED ORDER — SODIUM CHLORIDE 0.45 % IV SOLN: INTRAVENOUS | Status: AC

## 2020-05-08 MED ORDER — ALLOPURINOL 300 MG PO TABS
300.0000 mg | ORAL_TABLET | ORAL | Status: DC
  Administered 2020-05-09: 300 mg via ORAL
  Filled 2020-05-08 (×3): qty 1

## 2020-05-08 MED ORDER — ALUM & MAG HYDROXIDE-SIMETH 200-200-20 MG/5ML PO SUSP
30.0000 mL | Freq: Once | ORAL | Status: AC
  Administered 2020-05-08: 30 mL via ORAL
  Filled 2020-05-08: qty 30

## 2020-05-08 NOTE — ED Notes (Signed)
Attempted report a third time to [facility]. Asked to speak to another nurse. Secretary advises she already did transfer me to charge nurse, who did not answer the phone.

## 2020-05-08 NOTE — ED Notes (Signed)
Patient moved to hospital bed.

## 2020-05-08 NOTE — ED Notes (Signed)
Charge nurse Magda Paganini attempted report to 3E. Advised staff we will bring patient to the unit for bedside report.

## 2020-05-08 NOTE — ED Notes (Signed)
troponin

## 2020-05-08 NOTE — Plan of Care (Signed)
  Problem: Education: Goal: Understanding of cardiac disease, CV risk reduction, and recovery process will improve 05/08/2020 1221 by Arlina Robes, RN Outcome: Progressing 05/08/2020 1221 by Arlina Robes, RN Outcome: Progressing Goal: Individualized Educational Video(s) 05/08/2020 1221 by Arlina Robes, RN Outcome: Progressing 05/08/2020 1221 by Arlina Robes, RN Outcome: Progressing   Problem: Activity: Goal: Ability to tolerate increased activity will improve 05/08/2020 1221 by Arlina Robes, RN Outcome: Progressing 05/08/2020 1221 by Arlina Robes, RN Outcome: Progressing   Problem: Cardiac: Goal: Ability to achieve and maintain adequate cardiovascular perfusion will improve 05/08/2020 1221 by Arlina Robes, RN Outcome: Progressing 05/08/2020 1221 by Arlina Robes, RN Outcome: Progressing   Problem: Health Behavior/Discharge Planning: Goal: Ability to safely manage health-related needs after discharge will improve 05/08/2020 1221 by Arlina Robes, RN Outcome: Progressing 05/08/2020 1221 by Arlina Robes, RN Outcome: Progressing   Problem: Education: Goal: Knowledge of General Education information will improve Description: Including pain rating scale, medication(s)/side effects and non-pharmacologic comfort measures Outcome: Progressing   Problem: Health Behavior/Discharge Planning: Goal: Ability to manage health-related needs will improve Outcome: Progressing   Problem: Clinical Measurements: Goal: Ability to maintain clinical measurements within normal limits will improve Outcome: Progressing Goal: Will remain free from infection Outcome: Progressing Goal: Diagnostic test results will improve Outcome: Progressing Goal: Respiratory complications will improve Outcome: Progressing Goal: Cardiovascular complication will be avoided Outcome: Progressing   Problem: Activity: Goal: Risk for activity intolerance will  decrease Outcome: Progressing   Problem: Nutrition: Goal: Adequate nutrition will be maintained Outcome: Progressing   Problem: Coping: Goal: Level of anxiety will decrease Outcome: Progressing   Problem: Elimination: Goal: Will not experience complications related to bowel motility Outcome: Progressing Goal: Will not experience complications related to urinary retention Outcome: Progressing   Problem: Pain Managment: Goal: General experience of comfort will improve Outcome: Progressing   Problem: Safety: Goal: Ability to remain free from injury will improve Outcome: Progressing   Problem: Skin Integrity: Goal: Risk for impaired skin integrity will decrease Outcome: Progressing

## 2020-05-08 NOTE — ED Notes (Signed)
Attempted report to 3E, advised to call back

## 2020-05-08 NOTE — Progress Notes (Signed)
Kristin Davidson is a 79 y.o. female with medical history significant of CKD, HTN, Gout, GERD (not on medications), COPD, Arthritis and probable IBS who presented with abdominal pain, nausea and vomiting.  Patient reports that she was eating lunch today and she tried a bit of the fish, which was fried.  It seems like the food may have been sitting out.  She notes afterwards that she developed nausea, sharp abdominal pain in the lower abdomen and vomiting.  She vomited at least 2 times at home and then again with EMS.  She continued to retch, but was only bringing up phlegm.  There was no blood or bile in the emesis.  She further has had some mild diarrhea and a cough which started after vomiting.   After she had been retching, she developed chest pain.  She notes that the chest pain was 5/10, worse with cough and felt like a pressure.  It improved with medication given in the ED.  She noted no radiation of the pain, no headache, no blurry vision, fever chills, heartburn.  She does note increased urination today which is unusual for her.  She is a former smoker, quit 1 year ago.  She has a FH of MI in her father at 35 years old.  She has never had a TTE or stress test in our system.   She further denies headache, blurry vision, lightheadedness, passing out.   05/08/20: Hypokalemia, electrolytes are being repleted. Initial lipase is elevated.  Will repeat level.  Leukocytosis, will obtain CBC with diff and follow results.  Please refer to H&P dictated by my partner Dr. Daryll Drown on 05/08/20 for further details of the assessment and plan.

## 2020-05-08 NOTE — ED Notes (Signed)
Attempted report to 3E. Advised again to call back.

## 2020-05-08 NOTE — Plan of Care (Signed)

## 2020-05-08 NOTE — H&P (Signed)
History and Physical    Kristin Davidson PFX:[id number] DOB: [birth year] DOA: 05/07/2020  PCP: [name], MD  Patient coming from: Home  Chief Complaint: Abdominal pain, vomiting.   HPI: Kristin Davidson is a 79 y.o. female with medical history significant of CKD, HTN, Gout, GERD (not on medications), COPD, Arthritis and probable IBS who presented with abdominal pain, nausea and vomiting.  Patient reports that she was eating lunch today and she tried a bit of the fish, which was fried.  It seems like the food may have been sitting out.  She notes afterwards that she developed nausea, sharp abdominal pain in the lower abdomen and vomiting.  She vomited at least 2 times at home and then again with EMS.  She continued to retch, but was only bringing up phlegm.  There was no blood or bile in the emesis.  She further has had some mild diarrhea and a cough which started after vomiting.   After she had been retching, she developed chest pain.  She notes that the chest pain was 5/10, worse with cough and felt like a pressure.  It improved with medication given in the ED.  She noted no radiation of the pain, no headache, no blurry vision, fever chills, heartburn.  She does note increased urination today which is unusual for her.  She is a former smoker, quit 1 year ago.  She has a FH of MI in her father at 49 years old.  She has never had a TTE or stress test in our system.   She further denies headache, blurry vision, lightheadedness, passing out.   ED Course: In the ED, she had a CT scan of the abdomen which showed a likely colitis.  She had initial work up for chest pain with an EKG showing a RBBB and LPFB which is unclear if this is old (no previous).  She had a troponin of 20 which increased to 29.  Her telemetry showed bradycardia and PACs.  WBC was 13.9.  Na was 147.  Cr was 1.48 up from a baseline around 1.2.  Lipase was elevated to 68.  She received Morphine and Zofran X 2 along with a 1 L fluid bolus  in the ED  Review of Systems: As per HPI otherwise all other systems reviewed and are negative.  Past Medical History:  Diagnosis Date  . Arthritis   . COPD (chronic obstructive pulmonary disease) ([place])   . GERD (gastroesophageal reflux disease)   . Gout   . Hypertension   . Kidney disease     Past Surgical History:  Procedure Laterality Date  . ABDOMINAL HYSTERECTOMY    . ABDOMINAL SURGERY    . BREAST EXCISIONAL BIOPSY Right >10+ yrs ago   benign  . COLONOSCOPY    . KNEE SURGERY      Social History  reports that she quit smoking about 23 months ago. Her smoking use included cigarettes. She has never used smokeless tobacco. She reports that she does not drink alcohol and does not use drugs.  Allergies  Allergen Reactions  . Hydromorphone Hcl     REACTION: itching  . Naproxen Sodium   . Sulfonamide Derivatives   . Tramadol Hcl Other (See Comments)    Bloated and constipation     Family History  Problem Relation Age of Onset  . Bone cancer Mother   . Heart attack Father 50  . Colon cancer Neg Hx   . Esophageal cancer Neg Hx   .  Rectal cancer Neg Hx   . Stomach cancer Neg Hx     Prior to Admission medications   Medication Sig Start Date End Date Taking? Authorizing Provider  allopurinol (ZYLOPRIM) 300 MG tablet Take 150-300 mg by mouth daily. Alternate from 1/2 and 1 tablet daily 05/07/16   [provider]  amLODipine (NORVASC) 5 MG tablet Take 5 mg by mouth daily.    [provider]  benazepril (LOTENSIN) 10 MG tablet Take 10 mg by mouth daily. 03/09/16   [provider]  dicyclomine (BENTYL) 10 MG capsule Take 1 capsule (10 mg total) by mouth every 8 (eight) hours as needed for spasms. 10/03/18   Armbruster, Carlota Raspberry, MD  LINZESS 145 MCG CAPS capsule TAKE 1 CAPSULE BY MOUTH ONCE DAILY BEFORE BREAKFAST Patient taking differently: Take 145 mcg by mouth daily before breakfast.  01/23/18   Levin Erp, PA  polyethylene glycol powder  (GLYCOLAX/MIRALAX) powder Place 1 capful in 6 oz liquid and drink. Repeat this twice over the next 2 hours. 05/24/16   Janne Napoleon, NP  VENTOLIN HFA 108 (90 Base) MCG/ACT inhaler Inhale 2 puffs into the lungs every 4 (four) hours as needed for shortness of breath. 06/23/18   [provider]   She reports not being sure why she takes Linzess or dicyclomine   Physical Exam: Vitals:   05/07/20 2230 05/07/20 2300 05/08/20 0151 05/08/20 0230  BP: (!) 150/71 (!) 186/84 (!) 176/81 (!) 181/66  Pulse: 56 63 61 58  Resp: (!) 25 23 18 18   Temp:      TempSrc:      SpO2: 96% 96% 95% 96%  Weight:      Height:        Constitutional: NAD, calm, comfortable, lying in bed Eyes: lids and conjunctivae normal ENMT: Mucous membranes are moist. NCAT Neck: normal, supple Respiratory: CTAB, no wheezing or rales Cardiovascular: Bradycardic, irregular.  No murmur, no gallop, pulses are intact in radial and DP arteries.  No peripheral edema noted.  She has pain to palpation at lower sternum.  Abdomen: No TTP, mild distention but soft, +BS Musculoskeletal: no clubbing / cyanosis. No contractures, ROM of arms and legs are normal.  Skin: no rashes, lesions, ulcers on exposed skin.  She has skin tags on face Neurologic: CN 2-12 grossly intact. Strength intact in upper extremities 5/5.  She has no sensory changes to face or arms.  Psychiatric: Normal judgment and insight. Alert and oriented x 3. Normal mood.    Labs on Admission: I have personally reviewed following labs and imaging studies  CBC: Recent Labs  Lab 05/07/20 1740  WBC 13.9*  HGB 13.3  HCT 45.1  MCV 92.4  PLT 469    Basic Metabolic Panel: Recent Labs  Lab 05/07/20 1740  NA 147*  K 3.6  CL 110  CO2 28  GLUCOSE 129*  BUN 19  CREATININE 1.48*  CALCIUM 8.7*    GFR: Estimated Creatinine Clearance: 27 mL/min (A) (by C-G formula based on SCr of 1.48 mg/dL (H)).  Liver Function Tests: Recent Labs  Lab 05/07/20 1740  AST  40  ALT 32  ALKPHOS 82  BILITOT 0.4  PROT 6.8  ALBUMIN 3.8    Urine analysis:    Component Value Date/Time   COLORURINE STRAW (A) 05/07/2020 1830   APPEARANCEUR CLEAR 05/07/2020 1830   LABSPEC 1.014 05/07/2020 1830   PHURINE 6.0 05/07/2020 1830   GLUCOSEU NEGATIVE 05/07/2020 [place] NEGATIVE 05/07/2020 1830  BILIRUBINUR NEGATIVE 05/07/2020 [place] 05/07/2020 1830   PROTEINUR NEGATIVE 05/07/2020 1830   UROBILINOGEN 0.2 05/24/2016 1540   NITRITE NEGATIVE 05/07/2020 1830   LEUKOCYTESUR NEGATIVE 05/07/2020 1830    Radiological Exams on Admission: CT ABDOMEN PELVIS W CONTRAST  Result Date: 05/07/2020 CLINICAL DATA:  Abdominal pain EXAM: CT ABDOMEN AND PELVIS WITH CONTRAST TECHNIQUE: Multidetector CT imaging of the abdomen and pelvis was performed using the standard protocol following bolus administration of intravenous contrast. CONTRAST:  67mL OMNIPAQUE IOHEXOL 300 MG/ML  SOLN COMPARISON:  CT abdomen dated 08/24/2018. FINDINGS: Lower chest: No acute abnormality. Hepatobiliary: Liver is diffusely low in density suggesting fatty infiltration. No focal liver abnormality. Gallbladder is contracted but otherwise unremarkable. No bile duct dilatation seen. Pancreas: Unremarkable. No pancreatic ductal dilatation or surrounding inflammatory changes. Spleen: Normal in size without focal abnormality. Adrenals/Urinary Tract: Adrenal glands appear normal. Kidneys are unremarkable without mass, stone or hydronephrosis. No ureteral or bladder calculi identified. Bladder appears normal, partially decompressed. Stomach/Bowel: No dilated large or small bowel loops. No evidence of bowel wall inflammation. Fluid is seen throughout the large bowel which can be a secondary sign of underlying gastroenteritis or colitis. Appendix is not seen but there are no inflammatory changes about the cecum to suggest acute appendicitis. Vascular/Lymphatic: Aortic atherosclerosis. No acute appearing  vascular abnormality. No enlarged lymph nodes seen. Reproductive: Presumed hysterectomy.  No adnexal mass or free fluid. Other: No free fluid or abscess collection seen. No free intraperitoneal air. Musculoskeletal: No acute or suspicious osseous finding. Degenerative spondylosis of the lumbar spine, mild to moderate in degree. IMPRESSION: 1. Fluid is seen throughout the large bowel which can be a secondary sign of underlying gastroenteritis or colitis. However, no bowel wall thickening or mesenteric inflammation to confirm an enteritis or colitis. No bowel obstruction. 2. Fatty infiltration of the liver. 3. No other acute or significant findings within the abdomen or pelvis. Aortic Atherosclerosis (ICD10-I70.0). Electronically Signed   By: Franki Cabot M.D.   On: 05/07/2020 19:07    EKG: Independently reviewed. RBBB and LPFB, unclear age.   Assessment/Plan Atypical chest pain - Admit for observation - Telemetry - monitor bradycardia - Continue to trend troponin; 20 --> 29 - Nitroglycerin for recurrent chest pain - Repeat EKG - If any changes or Troponin starts to rise, will consult cardiology - CXR  Colitis Vomiting and nausea - IVF with 1/2 normal saline at 100cc/hr - Continue Rocephin/flagyl - Would anticipate if she is able to eat and drink tomorrow, could d/c Abx.  Likely acute reaction to something she ate.   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   Bradycardia - Noted on telemetry - Repeat EKG - Monitor on telemetry and for symptoms (dizziness, lightheadedness).   Essential hypertension - BP is elevated when I saw her and trending up - Restart home medications - PRN hydralazine for SBP > 180 (she is bradycardic)  Gout - Continue allopurinol  COPD - Continue ventolin inhaler    DVT prophylaxis: Lovenox Code Status:   Full  Disposition Plan:   Patient is from:  Home  Anticipated DC to:  Home  Anticipated DC date:  05/09/20  Anticipated DC  barriers: None  Consults called:  None, may need Cardiology  Admission status:  Obs, telemetry   Severity of Illness: The appropriate patient status for this patient is OBSERVATION. Observation status is judged to be reasonable and necessary in order to provide the required intensity  of service to ensure the patient's safety. The patient's presenting symptoms, physical exam findings, and initial radiographic and laboratory data in the context of their medical condition is felt to place them at decreased risk for further clinical deterioration. Furthermore, it is anticipated that the patient will be medically stable for discharge from the hospital within 2 midnights of admission. The following factors support the patient status of observation.   " The patient's presenting symptoms include Chest pain, vomiting. " The physical exam findings include reproducible chest pain. " The initial radiographic and laboratory data are Mildly elevated troponin.      Gilles Chiquito MD [facility]  How to contact the [facility] Attending or Consulting provider [place] or covering provider during after hours [place], for this patient?   1. Check the care team in [facility] and look for a) attending/consulting [facility] provider listed and b) the [facility] team listed 2. Log into [email or web address] and use [place]'s universal password to access. If you do not have the password, please contact the hospital operator. 3. Locate the [facility] provider you are looking for under [facility] and page to a number that you can be directly reached. 4. If you still have difficulty reaching the provider, please page the [facility] (Director on Call) for the Hospitalists listed on amion for assistance.  05/08/2020, 2:41 AM

## 2020-05-08 NOTE — ED Notes (Signed)
Please call [name] for a status update @ [phone]

## 2020-05-09 ENCOUNTER — Observation Stay (HOSPITAL_BASED_OUTPATIENT_CLINIC_OR_DEPARTMENT_OTHER): Payer: Medicare Other

## 2020-05-09 DIAGNOSIS — R778 Other specified abnormalities of plasma proteins: Secondary | ICD-10-CM | POA: Diagnosis present

## 2020-05-09 DIAGNOSIS — I5030 Unspecified diastolic (congestive) heart failure: Secondary | ICD-10-CM | POA: Diagnosis not present

## 2020-05-09 DIAGNOSIS — F29 Unspecified psychosis not due to a substance or known physiological condition: Secondary | ICD-10-CM | POA: Diagnosis present

## 2020-05-09 DIAGNOSIS — I10 Essential (primary) hypertension: Secondary | ICD-10-CM

## 2020-05-09 DIAGNOSIS — Z888 Allergy status to other drugs, medicaments and biological substances status: Secondary | ICD-10-CM | POA: Diagnosis not present

## 2020-05-09 DIAGNOSIS — N1831 Chronic kidney disease, stage 3a: Secondary | ICD-10-CM | POA: Diagnosis present

## 2020-05-09 DIAGNOSIS — R001 Bradycardia, unspecified: Secondary | ICD-10-CM | POA: Diagnosis not present

## 2020-05-09 DIAGNOSIS — M1990 Unspecified osteoarthritis, unspecified site: Secondary | ICD-10-CM | POA: Diagnosis present

## 2020-05-09 DIAGNOSIS — R112 Nausea with vomiting, unspecified: Secondary | ICD-10-CM | POA: Diagnosis present

## 2020-05-09 DIAGNOSIS — I130 Hypertensive heart and chronic kidney disease with heart failure and stage 1 through stage 4 chronic kidney disease, or unspecified chronic kidney disease: Secondary | ICD-10-CM | POA: Diagnosis not present

## 2020-05-09 DIAGNOSIS — R109 Unspecified abdominal pain: Secondary | ICD-10-CM | POA: Diagnosis present

## 2020-05-09 DIAGNOSIS — Z87891 Personal history of nicotine dependence: Secondary | ICD-10-CM | POA: Diagnosis not present

## 2020-05-09 DIAGNOSIS — N179 Acute kidney failure, unspecified: Secondary | ICD-10-CM | POA: Diagnosis present

## 2020-05-09 DIAGNOSIS — R42 Dizziness and giddiness: Secondary | ICD-10-CM | POA: Diagnosis not present

## 2020-05-09 DIAGNOSIS — K219 Gastro-esophageal reflux disease without esophagitis: Secondary | ICD-10-CM | POA: Diagnosis present

## 2020-05-09 DIAGNOSIS — I361 Nonrheumatic tricuspid (valve) insufficiency: Secondary | ICD-10-CM | POA: Diagnosis not present

## 2020-05-09 DIAGNOSIS — Z882 Allergy status to sulfonamides status: Secondary | ICD-10-CM | POA: Diagnosis not present

## 2020-05-09 DIAGNOSIS — Z79899 Other long term (current) drug therapy: Secondary | ICD-10-CM | POA: Diagnosis not present

## 2020-05-09 DIAGNOSIS — Z808 Family history of malignant neoplasm of other organs or systems: Secondary | ICD-10-CM | POA: Diagnosis not present

## 2020-05-09 DIAGNOSIS — Z8249 Family history of ischemic heart disease and other diseases of the circulatory system: Secondary | ICD-10-CM | POA: Diagnosis not present

## 2020-05-09 DIAGNOSIS — J449 Chronic obstructive pulmonary disease, unspecified: Secondary | ICD-10-CM | POA: Diagnosis present

## 2020-05-09 DIAGNOSIS — I340 Nonrheumatic mitral (valve) insufficiency: Secondary | ICD-10-CM

## 2020-05-09 DIAGNOSIS — Z20822 Contact with and (suspected) exposure to covid-19: Secondary | ICD-10-CM | POA: Diagnosis present

## 2020-05-09 DIAGNOSIS — R0789 Other chest pain: Secondary | ICD-10-CM | POA: Diagnosis present

## 2020-05-09 DIAGNOSIS — Z885 Allergy status to narcotic agent status: Secondary | ICD-10-CM | POA: Diagnosis not present

## 2020-05-09 DIAGNOSIS — M109 Gout, unspecified: Secondary | ICD-10-CM | POA: Diagnosis present

## 2020-05-09 DIAGNOSIS — I452 Bifascicular block: Secondary | ICD-10-CM | POA: Diagnosis present

## 2020-05-09 DIAGNOSIS — K529 Noninfective gastroenteritis and colitis, unspecified: Secondary | ICD-10-CM | POA: Diagnosis present

## 2020-05-09 DIAGNOSIS — K2100 Gastro-esophageal reflux disease with esophagitis, without bleeding: Secondary | ICD-10-CM | POA: Diagnosis present

## 2020-05-09 DIAGNOSIS — I5031 Acute diastolic (congestive) heart failure: Secondary | ICD-10-CM | POA: Diagnosis not present

## 2020-05-09 LAB — CBC WITH DIFFERENTIAL/PLATELET
Abs Immature Granulocytes: 0.02 10*3/uL (ref 0.00–0.07)
Basophils Absolute: 0 10*3/uL (ref 0.0–0.1)
Basophils Relative: 0 %
Eosinophils Absolute: 0.2 10*3/uL (ref 0.0–0.5)
Eosinophils Relative: 3 %
HCT: 42.3 % (ref 36.0–46.0)
Hemoglobin: 13.1 g/dL (ref 12.0–15.0)
Immature Granulocytes: 0 %
Lymphocytes Relative: 21 %
Lymphs Abs: 1.2 10*3/uL (ref 0.7–4.0)
MCH: 28.2 pg (ref 26.0–34.0)
MCHC: 31 g/dL (ref 30.0–36.0)
MCV: 91 fL (ref 80.0–100.0)
Monocytes Absolute: 0.6 10*3/uL (ref 0.1–1.0)
Monocytes Relative: 10 %
Neutro Abs: 3.6 10*3/uL (ref 1.7–7.7)
Neutrophils Relative %: 66 %
Platelets: 143 10*3/uL — ABNORMAL LOW (ref 150–400)
RBC: 4.65 MIL/uL (ref 3.87–5.11)
RDW: 13.2 % (ref 11.5–15.5)
WBC: 5.5 10*3/uL (ref 4.0–10.5)
nRBC: 0 % (ref 0.0–0.2)

## 2020-05-09 LAB — ECHOCARDIOGRAM COMPLETE
Area-P 1/2: 3.77 cm2
Calc EF: 81 %
Height: 61 in
S' Lateral: 3.3 cm
Single Plane A2C EF: 79.7 %
Single Plane A4C EF: 83.5 %
Weight: 2680 oz

## 2020-05-09 LAB — COMPREHENSIVE METABOLIC PANEL
ALT: 24 U/L (ref 0–44)
AST: 23 U/L (ref 15–41)
Albumin: 3.4 g/dL — ABNORMAL LOW (ref 3.5–5.0)
Alkaline Phosphatase: 68 U/L (ref 38–126)
Anion gap: 13 (ref 5–15)
BUN: 9 mg/dL (ref 8–23)
CO2: 27 mmol/L (ref 22–32)
Calcium: 8.4 mg/dL — ABNORMAL LOW (ref 8.9–10.3)
Chloride: 101 mmol/L (ref 98–111)
Creatinine, Ser: 1.24 mg/dL — ABNORMAL HIGH (ref 0.44–1.00)
GFR calc Af Amer: 48 mL/min — ABNORMAL LOW (ref >60)
GFR calc non Af Amer: 42 mL/min — ABNORMAL LOW (ref >60)
Glucose, Bld: 119 mg/dL — ABNORMAL HIGH (ref 70–99)
Potassium: 3.5 mmol/L (ref 3.5–5.1)
Sodium: 141 mmol/L (ref 135–145)
Total Bilirubin: 0.4 mg/dL (ref 0.3–1.2)
Total Protein: 6.2 g/dL — ABNORMAL LOW (ref 6.5–8.1)

## 2020-05-09 LAB — TSH: TSH: 1.316 u[IU]/mL (ref 0.350–4.500)

## 2020-05-09 LAB — TROPONIN I (HIGH SENSITIVITY): Troponin I (High Sensitivity): 16 ng/L (ref <18)

## 2020-05-09 MED ORDER — POTASSIUM CHLORIDE CRYS ER 20 MEQ PO TBCR
40.0000 meq | EXTENDED_RELEASE_TABLET | Freq: Once | ORAL | Status: AC
  Administered 2020-05-09: 40 meq via ORAL
  Filled 2020-05-09: qty 2

## 2020-05-09 MED ORDER — ALUM & MAG HYDROXIDE-SIMETH 200-200-20 MG/5ML PO SUSP
30.0000 mL | Freq: Four times a day (QID) | ORAL | Status: DC | PRN
  Administered 2020-05-09: 30 mL via ORAL
  Filled 2020-05-09: qty 30

## 2020-05-09 MED ORDER — PERFLUTREN LIPID MICROSPHERE
1.0000 mL | INTRAVENOUS | Status: AC | PRN
  Administered 2020-05-09: 2 mL via INTRAVENOUS
  Filled 2020-05-09: qty 10

## 2020-05-09 MED ORDER — AMLODIPINE BESYLATE 10 MG PO TABS
10.0000 mg | ORAL_TABLET | Freq: Every day | ORAL | Status: DC
  Administered 2020-05-09: 10 mg via ORAL
  Filled 2020-05-09: qty 1

## 2020-05-09 MED ORDER — ENOXAPARIN SODIUM 40 MG/0.4ML ~~LOC~~ SOLN
40.0000 mg | Freq: Every day | SUBCUTANEOUS | Status: DC
  Administered 2020-05-10: 40 mg via SUBCUTANEOUS
  Filled 2020-05-09: qty 0.4

## 2020-05-09 NOTE — Progress Notes (Signed)
PROGRESS NOTE  CHANDELL ATTRIDGE EXB:[id number] DOB: 1940-11-06 DOA: 05/07/2020 PCP: [name], MD  HPI/Recap of past 24 hours: Kristin Heady Chandleris a 79 y.o.femalewith medical history significant ofCKD 3A, HTN, Gout, GERD, COPD, Arthritis who presented with sudden onset lower abdominal pain, nausea and vomiting after eating fried ffish. She vomited at least 2 times at home and then again with EMS. She continued to retch, but was only bringing up phlegm. There was no blood or bile in the emesis. She further has had some mild diarrhea and a cough which started after vomiting. After she had been retching, she developed chest pain. She notes that the chest pain was 5/10, worse with cough and felt like a pressure. It improved with medication given in the ED. She noted no radiation of the pain. She is a former smoker, quit 1 year ago. She has a FH of MI in her father at the age of 80. Her twin brother had a pacemaker placed 3 years ago.  She has never had a TTE or stress test.  Elevated troponin on presentation, peaked at 29 and trended down. CT abd with suspected colitis for which she was started on empiric abx in the ED, Rocephin and Iv flagyl.  Hospital course complicated by sinus bradycardia. Had a 2D echo showed normal LVEF with grade 2 DD.  Cardiology consulted.  05/09/20: Reports Right lower quadrant abd tenderness with palpation. On IV abx for possible colitis.  Denies chest pain or dyspnea at rest.  PT evaluation planned today.    Assessment/Plan: Active Problems:   Essential hypertension   Atypical chest pain  Atypical chest pain -Presented with atypical chest pain with onset after vomiting, elevated troponin, on presentation, peaked at 29 and trended down  -She has never had a TTE or stress test. -2D echo with normal LVEF with grade 2 diastolic dysfunction  -Sinus bradycardia  -Cardiology will see   New diastolic CHF on 2D echo done on 05/09/20 No prior history of  cardiac disease She has a FH of MI in her father at the age of 75. Her twin brother had a pacemaker placed 3 years ago.   Euvolemic on exam Strict I's and O's and daily weight Cardiology consult  New sinus bradycardia Heart rate in the 40s Norvasc held  Essential hypertension BP not at goal Continue home benazepril Hold off Norvasc due to bradycardia Start low-dose po hydralazine  Possible colitis Presented with leukocytosis, vomiting and nausea and diarrhea, now resolved Currently on Rocephin and Flagyl, day #2 Still has some right lower abdominal tenderness with palpation We will switch to oral antibiotics for DC planning x7 days.   CKD 3A Close to her baseline creatinine Continue to avoid nephrotoxins Monitor urine output Trend creatinine  Gout - Continue allopurinol  COPD - Continue ventolin inhaler    DVT prophylaxis:      Lovenox subcu daily Code Status:              Full  Disposition Plan:              Patient is from:                        Home             Anticipated DC to:                   Home  Anticipated DC date:               05/10/20        Consults called:   Cardiology     Status is: Observation    Dispo:  Patient From: Home  Planned Disposition: Home  Expected discharge date: 05/10/20  Medically stable for discharge: No, ongoing work-up for bradycardia and new cardiac diagnosis,    Objective: Vitals:   05/09/20 0057 05/09/20 0338 05/09/20 0338 05/09/20 0804  BP: (!) 149/59 (!) 142/63 (!) 142/63 (!) 179/76  Pulse: 46 47 47 47  Resp: 17 18 18 18   Temp: 98.4 F (36.9 C) 98.8 F (37.1 C) 98.8 F (37.1 C) 98.3 F (36.8 C)  TempSrc: Oral Oral Oral Oral  SpO2: 97% 100% 100% 90%  Weight: 76 kg     Height:        Intake/Output Summary (Last 24 hours) at 05/09/2020 1028 Last data filed at 05/09/2020 0400 Gross per 24 hour  Intake 2028.88 ml  Output 1500 ml  Net 528.88 ml   Filed Weights   05/07/20 1733 05/09/20 0057   Weight: 64.9 kg 76 kg    Exam:  . General: 79 y.o. year-old female well developed well nourished in no acute distress.  Alert and oriented x3. . Cardiovascular: Regular rate and rhythm with no rubs or gallops.  No thyromegaly or JVD noted.   Marland Kitchen Respiratory: Clear to auscultation with no wheezes or rales. Good inspiratory effort. . Abdomen: Soft right lower quadrant tenderness on palpation.  Bowel sounds present.   . Musculoskeletal: No lower extremity edema. 2/4 pulses in all 4 extremities. Marland Kitchen Psychiatry: Mood is appropriate for condition and setting   Data Reviewed: CBC: Recent Labs  Lab 05/07/20 1740 05/08/20 1620 05/09/20 0553  WBC 13.9* 6.8 5.5  NEUTROABS  --  4.3 3.6  HGB 13.3 12.7 13.1  HCT 45.1 42.0 42.3  MCV 92.4 90.7 91.0  PLT 161 136* 267*   Basic Metabolic Panel: Recent Labs  Lab 05/07/20 1740 05/08/20 0805 05/08/20 1620 05/09/20 0553  NA 147* 144  --  141  K 3.6 3.4*  --  3.5  CL 110 109  --  101  CO2 28 23  --  27  GLUCOSE 129* 111*  --  119*  BUN 19 13  --  9  CREATININE 1.48* 1.21*  --  1.24*  CALCIUM 8.7* 8.2*  --  8.4*  MG  --   --  2.1  --    GFR: Estimated Creatinine Clearance: 34.9 mL/min (A) (by C-G formula based on SCr of 1.24 mg/dL (H)). Liver Function Tests: Recent Labs  Lab 05/07/20 1740 05/09/20 0553  AST 40 23  ALT 32 24  ALKPHOS 82 68  BILITOT 0.4 0.4  PROT 6.8 6.2*  ALBUMIN 3.8 3.4*   Recent Labs  Lab 05/07/20 1740 05/08/20 1620  LIPASE 68* 36   No results for input(s): AMMONIA in the last 168 hours. Coagulation Profile: No results for input(s): INR, PROTIME in the last 168 hours. Cardiac Enzymes: No results for input(s): CKTOTAL, CKMB, CKMBINDEX, TROPONINI in the last 168 hours. BNP (last 3 results) No results for input(s): PROBNP in the last 8760 hours. HbA1C: No results for input(s): HGBA1C in the last 72 hours. CBG: No results for input(s): GLUCAP in the last 168 hours. Lipid Profile: Recent Labs     05/08/20 0805  CHOL 125  HDL 38*  LDLCALC 69  TRIG 90  CHOLHDL 3.3  Thyroid Function Tests: Recent Labs    05/09/20 0553  TSH 1.316   Anemia Panel: No results for input(s): VITAMINB12, FOLATE, FERRITIN, TIBC, IRON, RETICCTPCT in the last 72 hours. Urine analysis:    Component Value Date/Time   COLORURINE STRAW (A) 05/07/2020 1830   APPEARANCEUR CLEAR 05/07/2020 1830   LABSPEC 1.014 05/07/2020 1830   PHURINE 6.0 05/07/2020 1830   GLUCOSEU NEGATIVE 05/07/2020 1830   HGBUR NEGATIVE 05/07/2020 1830   BILIRUBINUR NEGATIVE 05/07/2020 1830   KETONESUR NEGATIVE 05/07/2020 1830   PROTEINUR NEGATIVE 05/07/2020 1830   UROBILINOGEN 0.2 05/24/2016 1540   NITRITE NEGATIVE 05/07/2020 1830   LEUKOCYTESUR NEGATIVE 05/07/2020 1830   Sepsis Labs: @LABRCNTIP (procalcitonin:4,lacticidven:4)  ) Recent Results (from the past 240 hour(s))  SARS Coronavirus 2 by RT PCR (hospital order, performed in [facility]) Nasopharyngeal Nasopharyngeal Swab     Status: None   Collection Time: 05/08/20  1:45 AM   Specimen: Nasopharyngeal Swab  Result Value Ref Range Status   SARS Coronavirus 2 NEGATIVE NEGATIVE Final    Comment: (NOTE) SARS-CoV-2 target nucleic acids are NOT DETECTED.  The SARS-CoV-2 RNA is generally detectable in upper and lower respiratory specimens during the acute phase of infection. The lowest concentration of SARS-CoV-2 viral copies this assay can detect is 250 copies / mL. A negative result does not preclude SARS-CoV-2 infection and should not be used as the sole basis for treatment or other patient management decisions.  A negative result may occur with improper specimen collection / handling, submission of specimen other than nasopharyngeal swab, presence of viral mutation(s) within the areas targeted by this assay, and inadequate number of viral copies (<250 copies / mL). A negative result must be combined with clinical observations, patient history, and  epidemiological information.  Fact Sheet for Patients:   StrictlyIdeas.no  Fact Sheet for Healthcare Providers: BankingDealers.co.za  This test is not yet approved or  cleared by the [place] FDA and has been authorized for detection and/or diagnosis of SARS-CoV-2 by FDA under an Emergency Use Authorization (EUA).  This EUA will remain in effect (meaning this test can be used) for the duration of the COVID-19 declaration under Section 564(b)(1) of the Act, 21 U.S.C. section 360bbb-3(b)(1), unless the authorization is terminated or revoked sooner.  Performed at [facility], [place] [address], [place], [place] [address]       Studies: ECHOCARDIOGRAM COMPLETE  Result Date: 05/09/2020    ECHOCARDIOGRAM REPORT   Patient Name:   Kristin Davidson Date of Exam: 05/09/2020 Medical Rec #:  [id number]       Height:       61.0 in Accession #:    [id number]      Weight:       167.5 lb Date of Birth:  July 08, 1941       BSA:          1.752 m Patient Age:    75 years        BP:           179/76 mmHg Patient Gender: F               HR:           47 bpm. Exam Location:  Inpatient Procedure: 2D Echo and Intracardiac Opacification Agent Indications:    Chest Pain 786.50 / R07.9  History:        Patient has no prior history of Echocardiogram examinations.  COPD, Arrythmias:RBBB; Risk Factors:Hypertension. Chronic kidney                 disease. GERD. Gout. abdominal pain, nausea and vomiting. Chest                 pain and cough.  Sonographer:    Darlina Sicilian RDCS Referring Phys: [id number] [place]  1. Left ventricular ejection fraction, by estimation, is 60 to 65%. The left ventricle has normal function. The left ventricle has no regional wall motion abnormalities. There is mild left ventricular hypertrophy. Left ventricular diastolic parameters are consistent with Grade II diastolic dysfunction (pseudonormalization).  2. Right  ventricular systolic function is normal. The right ventricular size is normal. Tricuspid regurgitation signal is inadequate for assessing PA pressure.  3. The mitral valve is normal in structure. Mild to moderate mitral valve regurgitation. No evidence of mitral stenosis.  4. The aortic valve is normal in structure. Aortic valve regurgitation is not visualized. No aortic stenosis is present.  5. The inferior vena cava is normal in size with greater than 50% respiratory variability, suggesting right atrial pressure of 3 mmHg. FINDINGS  Left Ventricle: Left ventricular ejection fraction, by estimation, is 60 to 65%. The left ventricle has normal function. The left ventricle has no regional wall motion abnormalities. Definity contrast agent was given IV to delineate the left ventricular  endocardial borders. The left ventricular internal cavity size was normal in size. There is mild left ventricular hypertrophy. Left ventricular diastolic parameters are consistent with Grade II diastolic dysfunction (pseudonormalization). Right Ventricle: The right ventricular size is normal. No increase in right ventricular wall thickness. Right ventricular systolic function is normal. Tricuspid regurgitation signal is inadequate for assessing PA pressure. Left Atrium: Left atrial size was normal in size. Right Atrium: Right atrial size was normal in size. Pericardium: There is no evidence of pericardial effusion. Mitral Valve: The mitral valve is normal in structure. Normal mobility of the mitral valve leaflets. Mild to moderate mitral valve regurgitation. No evidence of mitral valve stenosis. Tricuspid Valve: The tricuspid valve is normal in structure. Tricuspid valve regurgitation is mild . No evidence of tricuspid stenosis. Aortic Valve: The aortic valve is normal in structure. Aortic valve regurgitation is not visualized. No aortic stenosis is present. Pulmonic Valve: The pulmonic valve was not well visualized. Pulmonic valve  regurgitation is not visualized. No evidence of pulmonic stenosis. Aorta: The aortic root is normal in size and structure. Venous: The inferior vena cava is normal in size with greater than 50% respiratory variability, suggesting right atrial pressure of 3 mmHg. IAS/Shunts: No atrial level shunt detected by color flow Doppler.  LEFT VENTRICLE PLAX 2D LVIDd:         4.00 cm Diastology LVIDs:         3.30 cm LV e' lateral:   8.63 cm/s LV PW:         1.30 cm LV E/e' lateral: 12.1 LV IVS:        1.20 cm LV e' medial:    5.93 cm/s LVOT diam:     1.80 cm LV E/e' medial:  17.5 LV SV:         53 LV SV Index:   30 LVOT Area:     2.54 cm  RIGHT VENTRICLE RV S prime:     12.50 cm/s TAPSE (M-mode): 2.0 cm LEFT ATRIUM             Index  RIGHT ATRIUM           Index LA diam:        3.30 cm 1.88 cm/m  RA Area:     13.50 cm LA Vol (A2C):   46.9 ml 26.77 ml/m RA Volume:   31.80 ml  18.15 ml/m LA Vol (A4C):   53.3 ml 30.43 ml/m LA Biplane Vol: 51.8 ml 29.57 ml/m  AORTIC VALVE LVOT Vmax:   90.80 cm/s LVOT Vmean:  60.600 cm/s LVOT VTI:    0.209 m  AORTA Ao Root diam: 2.70 cm MITRAL VALVE MV Area (PHT): 3.77 cm     SHUNTS MV Decel Time: 201 msec     Systemic VTI:  0.21 m MV E velocity: 104.00 cm/s  Systemic Diam: 1.80 cm MV A velocity: 62.40 cm/s MV E/A ratio:  1.67 Cherlynn Kaiser MD Electronically signed by Cherlynn Kaiser MD Signature Date/Time: 05/09/2020/9:17:06 AM    Final     Scheduled Meds: . allopurinol  150 mg Oral Q48H   And  . allopurinol  300 mg Oral Q48H  . benazepril  10 mg Oral Daily  . enoxaparin (LOVENOX) injection  30 mg Subcutaneous Daily  . sodium chloride flush  3 mL Intravenous Once    Continuous Infusions: . sodium chloride Stopped (05/09/20 0322)  . cefTRIAXone (ROCEPHIN)  IV 2 g (05/09/20 0322)  . metronidazole 500 mg (05/09/20 0533)     LOS: 0 days     Kayleen Memos, MD [facility] Pager [phone]  If 7PM-7AM, please contact night-coverage [email or web address] Password  [facility] 05/09/2020, 10:28 AM

## 2020-05-09 NOTE — Consult Note (Addendum)
Cardiology Consultation:   Patient ID: SYNA GAD; [id number]; 1941/04/02   Admit date: 05/07/2020 Date of Consult: 05/09/2020  Primary Care Provider: [name], MD Primary Cardiologist: New to [facility]  Patient Profile:   Kristin Davidson is a 79 y.o. female with a hx of CKD Stage, HTN, GERD, COPD (former tobacco use) and IBS who is being seen today for the evaluation of atypical chest pain and bradycardia at the request of Dr. Nevada Crane.   History of Present Illness:   Kristin Davidson is a 79yo F with a hx as stated above who presented to [facility] on 05/08/20 with abdominal pain, nausea and vomiting felt to be due to recent food ingestion. She reports that after eating lunch yesterday, she began vomiting at least 2-3 times at home. She continued to heave quite a bit and subsequently developed chest pain, worse with coughing and straining.  She reports never having chest pain in the past.  Pressure was centralized without radiation, diaphoresis or other associated symptoms. She denies recurrence since hospital arrival.  She denies LE edema, orthopnea, palpitations, dizziness, PND, shortness of breath or syncope.  CV risk factor includes hypertension and prior tobacco use along with family history of MI in her father at 25yo.   In the ED, abdominal CT showed evidence of enteritis or colitis, no bowel obstruction. CXR with borderline to mild cardiomegaly. No acute cardiopulmonary disease. EKG with RBBB TWI in anterior and lateral leads>> unclear if these are new given no prior tracing in epic system.  HST found to be 20> 29> 24> 22> 23> and 16.  Not consistent with ACS. Telemetry shows bradycardia with PACs.  Minimum heart rate at 41 bpm however mostly in the mid 50s.  WBC elevated at 13.9.  Creatinine elevated at 1.48 with a baseline of 1.2.  She was given morphine and Zofran x2 with a 1 L fluid bolus in the emergency department.  Past Medical History:  Diagnosis Date   Arthritis    COPD (chronic  obstructive pulmonary disease) (HCC)    GERD (gastroesophageal reflux disease)    Gout    Hypertension    Kidney disease     Past Surgical History:  Procedure Laterality Date   ABDOMINAL HYSTERECTOMY     ABDOMINAL SURGERY     BREAST EXCISIONAL BIOPSY Right >10+ yrs ago   benign   COLONOSCOPY     KNEE SURGERY       Prior to Admission medications   Medication Sig Start Date End Date Taking? Authorizing Provider  allopurinol (ZYLOPRIM) 300 MG tablet Take 150-300 mg by mouth daily. Alternate from 1/2 and 1 tablet daily 05/07/16  Yes [provider]  amLODipine (NORVASC) 5 MG tablet Take 5 mg by mouth daily.   Yes [provider]  benazepril (LOTENSIN) 10 MG tablet Take 10 mg by mouth daily. 03/09/16  Yes [provider]  polyethylene glycol powder (GLYCOLAX/MIRALAX) powder Place 1 capful in 6 oz liquid and drink. Repeat this twice over the next 2 hours. 05/24/16  Yes Mabe, Shanon Brow, NP  VENTOLIN HFA 108 (90 Base) MCG/ACT inhaler Inhale 2 puffs into the lungs every 4 (four) hours as needed for shortness of breath. 06/23/18  Yes [provider]  dicyclomine (BENTYL) 10 MG capsule Take 1 capsule (10 mg total) by mouth every 8 (eight) hours as needed for spasms. Patient not taking: Reported on 05/08/2020 10/03/18   Yetta Flock, MD  LINZESS 145 MCG CAPS capsule TAKE 1 CAPSULE  BY MOUTH ONCE DAILY BEFORE BREAKFAST Patient not taking: Reported on 05/08/2020 01/23/18   Levin Erp, PA    Inpatient Medications: Scheduled Meds:  allopurinol  150 mg Oral Q48H   And   allopurinol  300 mg Oral Q48H   benazepril  10 mg Oral Daily   enoxaparin (LOVENOX) injection  30 mg Subcutaneous Daily   sodium chloride flush  3 mL Intravenous Once   Continuous Infusions:  sodium chloride Stopped (05/09/20 0322)   cefTRIAXone (ROCEPHIN)  IV 2 g (05/09/20 0322)   metronidazole 500 mg (05/09/20 1456)   PRN Meds: sodium chloride, acetaminophen,  albuterol, alum & mag hydroxide-simeth, dicyclomine, ondansetron (ZOFRAN) IV  Allergies:    Allergies  Allergen Reactions   Hydromorphone Hcl     REACTION: itching   Naproxen Sodium Itching   Sulfonamide Derivatives     Dizziness   Tramadol Hcl Other (See Comments)    Bloated and constipation     Social History:   Social History   Socioeconomic History   Marital status: Widowed    Spouse name: Not on file   Number of children: Not on file   Years of education: Not on file   Highest education level: Not on file  Occupational History   Not on file  Tobacco Use   Smoking status: Former Smoker    Types: Cigarettes    Quit date: 06/08/2018    Years since quitting: 1.9   Smokeless tobacco: Never Used  Vaping Use   Vaping Use: Never used  Substance and Sexual Activity   Alcohol use: No   Drug use: No   Sexual activity: Not on file  Other Topics Concern   Not on file  Social History Narrative   Not on file   Social Determinants of Health   Financial Resource Strain:    Difficulty of Paying Living Expenses:   Food Insecurity:    Worried About Charity fundraiser in the Last Year:    Arboriculturist in the Last Year:   Transportation Needs:    Film/video editor (Medical):    Lack of Transportation (Non-Medical):   Physical Activity:    Days of Exercise per Week:    Minutes of Exercise per Session:   Stress:    Feeling of Stress :   Social Connections:    Frequency of Communication with Friends and Family:    Frequency of Social Gatherings with Friends and Family:    Attends Religious Services:    Active Member of Clubs or Organizations:    Attends Music therapist:    Marital Status:   Intimate Partner Violence:    Fear of Current or Ex-Partner:    Emotionally Abused:    Physically Abused:    Sexually Abused:     Family History:   Family History  Problem Relation Age of Onset   Bone cancer Mother     Heart attack Father 49   Colon cancer Neg Hx    Esophageal cancer Neg Hx    Rectal cancer Neg Hx    Stomach cancer Neg Hx    Family Status:  Family Status  Relation Name Status   Mother  Deceased   Father  Deceased   Neg Hx  (Not Specified)    ROS:  Please see the history of present illness.  All other ROS reviewed and negative.     Physical Exam/Data:   Vitals:   05/09/20 4132 05/09/20 4401 05/09/20 0272  05/09/20 1059  BP: (!) 142/63 (!) 142/63 (!) 179/76 (!) 169/77  Pulse: 47 47 47 55  Resp: 18 18 18 16   Temp: 98.8 F (37.1 C) 98.8 F (37.1 C) 98.3 F (36.8 C) 98.4 F (36.9 C)  TempSrc: Oral Oral Oral Oral  SpO2: 100% 100% 90% 96%  Weight:      Height:        Intake/Output Summary (Last 24 hours) at 05/09/2020 1529 Last data filed at 05/09/2020 0400 Gross per 24 hour  Intake 1568.88 ml  Output 1200 ml  Net 368.88 ml   Filed Weights   05/07/20 1733 05/09/20 0057  Weight: 64.9 kg 76 kg   Body mass index is 31.65 kg/m.   General: Well developed, well nourished, NAD Neck: Negative for carotid bruits. No JVD Lungs:Clear to ausculation bilaterally. Breathing is unlabored. Cardiovascular: RRR with S1 S2. No murmurs] Abdomen: Soft, non-tender, non-distended. No obvious abdominal masses. Extremities: No edema.  Radial pulses 2+ bilaterally Neuro: Alert and oriented. No focal deficits. No facial asymmetry. MAE spontaneously. Psych: Responds to questions appropriately with normal affect.    EKG:  The EKG was personally reviewed and demonstrates: 05/09/2020 NSR with diffuse TWI, HR 47 bpm with RBBB>> unclear if this is new or old Telemetry:  Telemetry was personally reviewed and demonstrates: 05/09/2020 sinus bradycardia with most heart rates in the mid 50s however episodes of bradycardia in the mid to low 40s on review  Relevant CV Studies:  Echocardiogram 05/09/2020:  1. Left ventricular ejection fraction, by estimation, is 60 to 65%. The  left ventricle has  normal function. The left ventricle has no regional  wall motion abnormalities. There is mild left ventricular hypertrophy.  Left ventricular diastolic parameters  are consistent with Grade II diastolic dysfunction (pseudonormalization).  2. Right ventricular systolic function is normal. The right ventricular  size is normal. Tricuspid regurgitation signal is inadequate for assessing  PA pressure.  3. The mitral valve is normal in structure. Mild to moderate mitral valve  regurgitation. No evidence of mitral stenosis.  4. The aortic valve is normal in structure. Aortic valve regurgitation is  not visualized. No aortic stenosis is present.  5. The inferior vena cava is normal in size with greater than 50%  respiratory variability, suggesting right atrial pressure of 3 mmHg.   Laboratory Data:  Chemistry Recent Labs  Lab 05/07/20 1740 05/08/20 0805 05/09/20 0553  NA 147* 144 141  K 3.6 3.4* 3.5  CL 110 109 101  CO2 28 23 27   GLUCOSE 129* 111* 119*  BUN 19 13 9   CREATININE 1.48* 1.21* 1.24*  CALCIUM 8.7* 8.2* 8.4*  GFRNONAA 34* 43* 42*  GFRAA 39* 50* 48*  ANIONGAP 9 12 13     Total Protein  Date Value Ref Range Status  05/09/2020 6.2 (L) 6.5 - 8.1 g/dL Final   Albumin  Date Value Ref Range Status  05/09/2020 3.4 (L) 3.5 - 5.0 g/dL Final   AST  Date Value Ref Range Status  05/09/2020 23 15 - 41 U/L Final   ALT  Date Value Ref Range Status  05/09/2020 24 0 - 44 U/L Final   Alkaline Phosphatase  Date Value Ref Range Status  05/09/2020 68 38 - 126 U/L Final   Total Bilirubin  Date Value Ref Range Status  05/09/2020 0.4 0.3 - 1.2 mg/dL Final   Hematology Recent Labs  Lab 05/07/20 1740 05/08/20 1620 05/09/20 0553  WBC 13.9* 6.8 5.5  RBC 4.88 4.63 4.65  HGB  13.3 12.7 13.1  HCT 45.1 42.0 42.3  MCV 92.4 90.7 91.0  MCH 27.3 27.4 28.2  MCHC 29.5* 30.2 31.0  RDW 13.3 13.2 13.2  PLT 161 136* 143*   Cardiac EnzymesNo results for input(s): TROPONINI in the  last 168 hours. No results for input(s): TROPIPOC in the last 168 hours.  BNPNo results for input(s): BNP, PROBNP in the last 168 hours.  DDimer No results for input(s): DDIMER in the last 168 hours. TSH:  Lab Results  Component Value Date   TSH 1.316 05/09/2020   Lipids: Lab Results  Component Value Date   CHOL 125 05/08/2020   HDL 38 (L) 05/08/2020   LDLCALC 69 05/08/2020   TRIG 90 05/08/2020   CHOLHDL 3.3 05/08/2020   HgbA1c:No results found for: HGBA1C  Radiology/Studies:  DG Chest 2 View  Result Date: 05/08/2020 CLINICAL DATA:  Acute onset chest pain. EXAM: CHEST - 2 VIEW COMPARISON:  02/19/2014. FINDINGS: AP ERECT and LATERAL images were obtained. Cardiac silhouette upper normal in size to mildly enlarged for the AP technique. Thoracic aorta tortuous and mildly atherosclerotic. Hilar and mediastinal contours otherwise unremarkable. Lungs clear. Bronchovascular markings normal. Pulmonary vascularity normal. No visible pleural effusions. No pneumothorax. Visualized bony thorax unremarkable apart from degenerative changes in the RIGHT shoulder. IMPRESSION: Borderline to mild cardiomegaly. No acute cardiopulmonary disease. Electronically Signed   By: Evangeline Dakin M.D.   On: 05/08/2020 08:18   CT ABDOMEN PELVIS W CONTRAST  Result Date: 05/07/2020 CLINICAL DATA:  Abdominal pain EXAM: CT ABDOMEN AND PELVIS WITH CONTRAST TECHNIQUE: Multidetector CT imaging of the abdomen and pelvis was performed using the standard protocol following bolus administration of intravenous contrast. CONTRAST:  59mL OMNIPAQUE IOHEXOL 300 MG/ML  SOLN COMPARISON:  CT abdomen dated 08/24/2018. FINDINGS: Lower chest: No acute abnormality. Hepatobiliary: Liver is diffusely low in density suggesting fatty infiltration. No focal liver abnormality. Gallbladder is contracted but otherwise unremarkable. No bile duct dilatation seen. Pancreas: Unremarkable. No pancreatic ductal dilatation or surrounding inflammatory  changes. Spleen: Normal in size without focal abnormality. Adrenals/Urinary Tract: Adrenal glands appear normal. Kidneys are unremarkable without mass, stone or hydronephrosis. No ureteral or bladder calculi identified. Bladder appears normal, partially decompressed. Stomach/Bowel: No dilated large or small bowel loops. No evidence of bowel wall inflammation. Fluid is seen throughout the large bowel which can be a secondary sign of underlying gastroenteritis or colitis. Appendix is not seen but there are no inflammatory changes about the cecum to suggest acute appendicitis. Vascular/Lymphatic: Aortic atherosclerosis. No acute appearing vascular abnormality. No enlarged lymph nodes seen. Reproductive: Presumed hysterectomy.  No adnexal mass or free fluid. Other: No free fluid or abscess collection seen. No free intraperitoneal air. Musculoskeletal: No acute or suspicious osseous finding. Degenerative spondylosis of the lumbar spine, mild to moderate in degree. IMPRESSION: 1. Fluid is seen throughout the large bowel which can be a secondary sign of underlying gastroenteritis or colitis. However, no bowel wall thickening or mesenteric inflammation to confirm an enteritis or colitis. No bowel obstruction. 2. Fatty infiltration of the liver. 3. No other acute or significant findings within the abdomen or pelvis. Aortic Atherosclerosis (ICD10-I70.0). Electronically Signed   By: Franki Cabot M.D.   On: 05/07/2020 19:07   ECHOCARDIOGRAM COMPLETE  Result Date: 05/09/2020    ECHOCARDIOGRAM REPORT   Patient Name:   YUMALAY CIRCLE Date of Exam: 05/09/2020 Medical Rec #:  [id number]       Height:       61.0 in Accession #:  [id number]      Weight:       167.5 lb Date of Birth:  [birth year]       BSA:          1.752 m Patient Age:    33 years        BP:           179/76 mmHg Patient Gender: F               HR:           47 bpm. Exam Location:  Inpatient Procedure: 2D Echo and Intracardiac Opacification Agent Indications:     Chest Pain 786.50 / R07.9  History:        Patient has no prior history of Echocardiogram examinations.                 COPD, Arrythmias:RBBB; Risk Factors:Hypertension. Chronic kidney                 disease. GERD. Gout. abdominal pain, nausea and vomiting. Chest                 pain and cough.  Sonographer:    Darlina Sicilian RDCS Referring Phys: [id number] [place]  1. Left ventricular ejection fraction, by estimation, is 60 to 65%. The left ventricle has normal function. The left ventricle has no regional wall motion abnormalities. There is mild left ventricular hypertrophy. Left ventricular diastolic parameters are consistent with Grade II diastolic dysfunction (pseudonormalization).  2. Right ventricular systolic function is normal. The right ventricular size is normal. Tricuspid regurgitation signal is inadequate for assessing PA pressure.  3. The mitral valve is normal in structure. Mild to moderate mitral valve regurgitation. No evidence of mitral stenosis.  4. The aortic valve is normal in structure. Aortic valve regurgitation is not visualized. No aortic stenosis is present.  5. The inferior vena cava is normal in size with greater than 50% respiratory variability, suggesting right atrial pressure of 3 mmHg. FINDINGS  Left Ventricle: Left ventricular ejection fraction, by estimation, is 60 to 65%. The left ventricle has normal function. The left ventricle has no regional wall motion abnormalities. Definity contrast agent was given IV to delineate the left ventricular  endocardial borders. The left ventricular internal cavity size was normal in size. There is mild left ventricular hypertrophy. Left ventricular diastolic parameters are consistent with Grade II diastolic dysfunction (pseudonormalization). Right Ventricle: The right ventricular size is normal. No increase in right ventricular wall thickness. Right ventricular systolic function is normal. Tricuspid regurgitation signal is  inadequate for assessing PA pressure. Left Atrium: Left atrial size was normal in size. Right Atrium: Right atrial size was normal in size. Pericardium: There is no evidence of pericardial effusion. Mitral Valve: The mitral valve is normal in structure. Normal mobility of the mitral valve leaflets. Mild to moderate mitral valve regurgitation. No evidence of mitral valve stenosis. Tricuspid Valve: The tricuspid valve is normal in structure. Tricuspid valve regurgitation is mild . No evidence of tricuspid stenosis. Aortic Valve: The aortic valve is normal in structure. Aortic valve regurgitation is not visualized. No aortic stenosis is present. Pulmonic Valve: The pulmonic valve was not well visualized. Pulmonic valve regurgitation is not visualized. No evidence of pulmonic stenosis. Aorta: The aortic root is normal in size and structure. Venous: The inferior vena cava is normal in size with greater than 50% respiratory variability, suggesting right atrial pressure of 3 mmHg. IAS/Shunts: No atrial level shunt detected by  color flow Doppler.  LEFT VENTRICLE PLAX 2D LVIDd:         4.00 cm Diastology LVIDs:         3.30 cm LV e' lateral:   8.63 cm/s LV PW:         1.30 cm LV E/e' lateral: 12.1 LV IVS:        1.20 cm LV e' medial:    5.93 cm/s LVOT diam:     1.80 cm LV E/e' medial:  17.5 LV SV:         53 LV SV Index:   30 LVOT Area:     2.54 cm  RIGHT VENTRICLE RV S prime:     12.50 cm/s TAPSE (M-mode): 2.0 cm LEFT ATRIUM             Index       RIGHT ATRIUM           Index LA diam:        3.30 cm 1.88 cm/m  RA Area:     13.50 cm LA Vol (A2C):   46.9 ml 26.77 ml/m RA Volume:   31.80 ml  18.15 ml/m LA Vol (A4C):   53.3 ml 30.43 ml/m LA Biplane Vol: 51.8 ml 29.57 ml/m  AORTIC VALVE LVOT Vmax:   90.80 cm/s LVOT Vmean:  60.600 cm/s LVOT VTI:    0.209 m  AORTA Ao Root diam: 2.70 cm MITRAL VALVE MV Area (PHT): 3.77 cm     SHUNTS MV Decel Time: 201 msec     Systemic VTI:  0.21 m MV E velocity: 104.00 cm/s  Systemic Diam:  1.80 cm MV A velocity: 62.40 cm/s MV E/A ratio:  1.67 Kristin Kaiser MD Electronically signed by Kristin Kaiser MD Signature Date/Time: 05/09/2020/9:17:06 AM    Final    Assessment and Plan:   1.  Asymptomatic bradycardia: -Admitted for GI symptoms on 05/09/2020 found to have colitis versus gastroenteritis on abdominal work-up with imaging>> now being treated with IVF and antibiotics per primary team.  Telemetry/EKG on presentation showed bradycardia with heart rates in the mid 50s however episodes in the low 40s (ealry this AM).  Patient reports she has never been told that she has a lower heart rate. She denies dizziness or syncope.  No palpitations. She is not on PTA AV nodal blocking agents. Plan to continue monitoring on telemetry for now.  May consider outpatient cardiac monitoring at discharge.  Avoid AV nodal blocking agents  2.  Atypical chest pain: -Patient reports she has never had anginal symptoms in the past.  Chest pain/pressure began after multiple episodes of vomiting and have since subsided with no recurrence since hospital admission.  She has no prior history of CAD.   -EKG with no acute ST changes however diffuse TWI and no tracing for comparison -hsT remain stable and flat, not consistent with ACS -Likely chest pain in the setting of vomiting, dry heaving on hospital presentation -Echocardiogram with stable LV function and no regional wall motion abnormalities -No plans for further ischemic work-up at this time  3.  Acute colitis with nausea and vomiting: -Patient presented with 1 day history of acute onset of nausea and vomiting responsive to IV fluid hydration -Abdominal imaging with gastroenteritis versus colitis -Currently treated with Rocephin/Flagyl -Management per primary team  4.  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  Hypertension: -Stable, 169/77, 179/76, 142/63 -On PTA amlodipine 5, benazepril 10 -Would restart amlodipine at 10 mg p.o. daily -Continue benazepril -Creatinine stable  6. 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   Lyndel Safe NP-C [facility] Pager: [phone] 05/09/2020 3:29 PM  Patient seen and examined.  Agree with above documentation.  Kristin Davidson is a 79 year old female with a history of hypertension, COPD, CKD who we are consulted to see by Dr Nevada Crane for evaluation of chest pain and bradycardia.  She was admitted on 05/08/2020 with abdominal pain and nausea/vomiting.  She developed the symptoms after eating fish yesterday.  Vomited multiple times and reported sharp abdominal pain.  Following the multiple episodes of vomiting she began having chest pain, described as tightness in her lower chest/upper abdomen.  Lasted about 5 to 10 minutes.  Reports has never had chest pain before.  States that she is not very active, used to go for walks regularly but has not done so in more than a year.  However, denies any history of exertional chest pain.  In the ED, she underwent a CT abdomen which showed likely colitis.  EKG personally reviewed and shows sinus bradycardia with PACs, RBBB, LAFB, T wave inversions in leads V3-6.  Telemetry personally reviewed and shows sinus rhythm, rates from high 40s to 60s.  Labs notable for high-sensitivity troponin 20 >29 > 24 > 22 > 23 > 16.  Echocardiogram today showed EF 60 to 65%, mild LVH, grade 2 diastolic dysfunction, normal RV function, mild to moderate MR.  For her chest pain, suspect esophagitis as pain occurred after multiple episodes of vomiting.  Troponins are flat  and very mildly elevated, not consistent with acute coronary syndrome.  Echocardiogram shows no wall motion abnormalities.  No further cardiac work-up recommended.  For her bradycardia, she has sinus bradycardia noted as low as 40s.  Appears asymptomatic with this.  Would continue to monitor on telemetry.  Avoid AV nodal blocking agents.  Donato Heinz, MD

## 2020-05-09 NOTE — Evaluation (Signed)
Physical Therapy Evaluation Patient Details Name: Kristin Davidson MRN: [id number] DOB: 10-Dec-1940 Today's Date: 05/09/2020   History of Present Illness  79 yo female with onset of vomiting and nausea was admitted from home, had been eating fish.  Had onset of chest pain after bouts of vomiting, new bradycardia and some diarrhea.  New dx of CHF from echo done 05/09/20.  PMHx:  CKD3a, HTN, gout, GERD, COPD, OA,   Clinical Impression  Pt was seen for mobility on hall, with HHA to S for gait and transfers.  Pt is getting up stairs, is demonstrating more control but is not fully safe to walk alone.  Have recommended with pt's agreement to have home therapy, and she will have family assistance to transition to that level of care.  Follow acutely to make progress on her needs with LE strength, balance and control and distance of gait.    Follow Up Recommendations Home health PT;Supervision for mobility/OOB    Equipment Recommendations  Rolling walker with 5" wheels    Recommendations for Other Services       Precautions / Restrictions Precautions Precautions: Fall Precaution Comments: monitor sats and HR Restrictions Weight Bearing Restrictions: No      Mobility  Bed Mobility Overal bed mobility: Needs Assistance Bed Mobility: Supine to Sit;Sit to Supine     Supine to sit: Min assist Sit to supine: Min guard   General bed mobility comments: min assist to scoot out to EOB  Transfers Overall transfer level: Needs assistance Equipment used: 1 person hand held assist Transfers: Sit to/from Stand Sit to Stand: Min guard            Ambulation/Gait Ambulation/Gait assistance: Min guard Gait Distance (Feet): 200 Feet Assistive device: 1 person hand held assist Gait Pattern/deviations: Step-through pattern;Decreased stride length;Wide base of support Gait velocity: reduced Gait velocity interpretation: <1.31 ft/sec, indicative of household ambulator General Gait Details: careful  steps to walk on hallway and in her room with no AD  Stairs Stairs: Yes Stairs assistance: Min guard Stair Management: One rail Right;Forwards;Alternating pattern Number of Stairs: 5 General stair comments: worked on staircase to follow through with skills for home  Wheelchair Mobility    Modified Rankin (Stroke Patients Only)       Balance Overall balance assessment: Needs assistance Sitting-balance support: Feet supported Sitting balance-Leahy Scale: Fair     Standing balance support: Single extremity supported Standing balance-Leahy Scale: Fair                               Pertinent Vitals/Pain Pain Assessment: No/denies pain    Home Living Family/patient expects to be discharged to:: Private residence Living Arrangements: Children Available Help at Discharge: Family;Available 24 hours/day Type of Home: House Home Access: Stairs to enter Entrance Stairs-Rails: Right Entrance Stairs-Number of Steps: 8 Home Layout: One level Home Equipment: Walker - 2 wheels Additional Comments: has not needed to use AD at home    Prior Function Level of Independence: Independent         Comments: home with daughter to help     Hand Dominance   Dominant Hand: Right    Extremity/Trunk Assessment   Upper Extremity Assessment Upper Extremity Assessment: Generalized weakness    Lower Extremity Assessment Lower Extremity Assessment: Generalized weakness    Cervical / Trunk Assessment Cervical / Trunk Assessment: Kyphotic  Communication   Communication: No difficulties  Cognition Arousal/Alertness: Awake/alert Behavior During Therapy:  WFL for tasks assessed/performed Overall Cognitive Status: Within Functional Limits for tasks assessed                                        General Comments General comments (skin integrity, edema, etc.): Pt was able to walk on stairs and hall with SOB noted, but O2 sats were over 90% the entire  trip    Exercises Other Exercises Other Exercises: LE strength is grossly WFL x 3+ hip strength   Assessment/Plan    PT Assessment Patient needs continued PT services  PT Problem List Decreased strength;Decreased range of motion;Decreased activity tolerance;Decreased balance;Decreased mobility;Decreased coordination;Decreased knowledge of use of DME;Cardiopulmonary status limiting activity       PT Treatment Interventions DME instruction;Gait training;Stair training;Functional mobility training;Therapeutic activities;Therapeutic exercise;Balance training;Neuromuscular re-education;Patient/family education    PT Goals (Current goals can be found in the Care Plan section)  Acute Rehab PT Goals Patient Stated Goal: to get home and feel better PT Goal Formulation: With patient Time For Goal Achievement: 05/23/20 Potential to Achieve Goals: Good    Frequency Min 3X/week   Barriers to discharge Inaccessible home environment Stairs at home    Co-evaluation               AM-PAC PT "6 Clicks" Mobility  Outcome Measure Help needed turning from your back to your side while in a flat bed without using bedrails?: None Help needed moving from lying on your back to sitting on the side of a flat bed without using bedrails?: A Little Help needed moving to and from a bed to a chair (including a wheelchair)?: A Little Help needed standing up from a chair using your arms (e.g., wheelchair or bedside chair)?: A Little Help needed to walk in hospital room?: A Little Help needed climbing 3-5 steps with a railing? : A Little 6 Click Score: 19    End of Session Equipment Utilized During Treatment: Gait belt Activity Tolerance: Patient tolerated treatment well;Patient limited by fatigue Patient left: in bed;with call bell/phone within reach;with bed alarm set Nurse Communication: Mobility status PT Visit Diagnosis: Unsteadiness on feet (R26.81);Muscle weakness (generalized) (M62.81)     Time: 4888-9169 PT Time Calculation (min) (ACUTE ONLY): 31 min   Charges:   PT Evaluation $PT Eval Moderate Complexity: 1 Mod PT Treatments $Gait Training: 8-22 mins        Kristin Davidson 05/09/2020, 9:05 PM  Mee Hives, PT MS Acute Rehab Dept. Number: [place]

## 2020-05-09 NOTE — Progress Notes (Signed)
  Echocardiogram 2D Echocardiogram with definity has been performed.  Kristin Davidson M 05/09/2020, 8:21 AM

## 2020-05-09 NOTE — Plan of Care (Signed)

## 2020-05-09 NOTE — Progress Notes (Signed)
CrCl>30 ml/min. Adjust enoxaparin to 40mg  SQ qday.  Onnie Boer, PharmD, BCIDP, AAHIVP, CPP Infectious Disease Pharmacist 05/09/2020 7:48 PM

## 2020-05-10 ENCOUNTER — Ambulatory Visit (INDEPENDENT_AMBULATORY_CARE_PROVIDER_SITE_OTHER): Payer: Medicare Other

## 2020-05-10 ENCOUNTER — Telehealth: Payer: Self-pay | Admitting: Internal Medicine

## 2020-05-10 DIAGNOSIS — R001 Bradycardia, unspecified: Secondary | ICD-10-CM | POA: Diagnosis not present

## 2020-05-10 DIAGNOSIS — R42 Dizziness and giddiness: Secondary | ICD-10-CM

## 2020-05-10 MED ORDER — HYDRALAZINE HCL 10 MG PO TABS: 10.0000 mg | ORAL_TABLET | Freq: Three times a day (TID) | ORAL | 0 refills | Status: DC

## 2020-05-10 MED ORDER — PANTOPRAZOLE SODIUM 40 MG PO TBEC: 40.0000 mg | DELAYED_RELEASE_TABLET | Freq: Every day | ORAL | 0 refills | Status: DC

## 2020-05-10 MED ORDER — SACCHAROMYCES BOULARDII 250 MG PO CAPS: 250.0000 mg | ORAL_CAPSULE | Freq: Two times a day (BID) | ORAL | 0 refills | Status: AC

## 2020-05-10 MED ORDER — SACCHAROMYCES BOULARDII 250 MG PO CAPS
250.0000 mg | ORAL_CAPSULE | Freq: Two times a day (BID) | ORAL | Status: DC
  Administered 2020-05-10: 250 mg via ORAL
  Filled 2020-05-10: qty 1

## 2020-05-10 MED ORDER — HYDRALAZINE HCL 10 MG PO TABS
10.0000 mg | ORAL_TABLET | Freq: Three times a day (TID) | ORAL | Status: DC
  Administered 2020-05-10: 10 mg via ORAL
  Filled 2020-05-10: qty 1

## 2020-05-10 MED ORDER — PANTOPRAZOLE SODIUM 40 MG PO TBEC
40.0000 mg | DELAYED_RELEASE_TABLET | Freq: Every day | ORAL | Status: DC
  Administered 2020-05-10: 40 mg via ORAL
  Filled 2020-05-10: qty 1

## 2020-05-10 MED FILL — hydrALAZINE HCL 10 MG TABS: 10 | 30 days supply | Qty: 90 | Fill #0

## 2020-05-10 MED FILL — PANTOPRAZOLE SOD DR 40 MG T: 40 | 30 days supply | Qty: 30 | Fill #0

## 2020-05-10 MED FILL — FLORASTOR 250 MG CAPSULE: 250 | 10 days supply | Qty: 20 | Fill #0

## 2020-05-10 NOTE — Telephone Encounter (Signed)
Could we get her scheduled in Afib clinic this week for new Afib?

## 2020-05-10 NOTE — TOC Transition Note (Signed)
Transition of Care [facility]) - CM/SW Discharge Note   Patient Details  Name: Kristin Davidson MRN: [id number] Date of Birth: 02-09-1941  [facility]) CM/SW Contact:  Zenon Mayo, RN Phone Number: 05/10/2020, 9:56 AM   Clinical Narrative:    Patient is for dc today,  NCM offered choice, she chose [facility] , they would not take referral, then she states she has no preference.    NCM made referral to [facility] with [employer] for rolling walker. NCM will notify MD to send meds to [place].   NCM made referral to [place] with [facility], she is able to take referral. Soc will begin 24 to 48 hrs post dc.   Final next level of care: National Barriers to Discharge: No Barriers Identified   Patient Goals and CMS Choice Patient states their goals for this hospitalization and ongoing recovery are:: get better CMS Medicare.gov Compare Post Acute Care list provided to:: Patient Choice offered to / list presented to : Patient  Discharge Placement                       Discharge Plan and Services                DME Arranged: Walker rolling DME Agency: [employer] Date DME Agency Contacted: 05/10/20 Time DME Agency Contacted: [phone] Representative spoke with at DME Agency: Thedore Mins HH Arranged: PT [place]: [facility] [employer] Date [place]: 05/10/20 Time [place]: [phone] Representative spoke with at [place]: [place] ([place]) Interventions     Readmission Risk Interventions No flowsheet data found.

## 2020-05-10 NOTE — TOC Transition Note (Addendum)
Transition of Care [facility]) - CM/SW Discharge Note   Patient Details  Name: Kristin Davidson MRN: [id number] Date of Birth: 1940-12-08  Transition of Care [facility]) CM/SW Contact:  Zenon Mayo, RN Phone Number: 05/10/2020, 9:18 AM   Clinical Narrative:    Patient is for dc today,  NCM offered choice, she chose [facility] , they would not take referral, then she states she has no preference.  NCM made referral to [place] with [facility] for [place].  She states she has to check and get back with this NCM.  Awaiting call back.  NCM made referral to [facility] with [employer] for rolling walker. NCM will notify MD to send meds to [place].  Per [place] they can not take referral for HHPT, no staff.  NCM made referral to [place] with [facility], she is able to take referral. Soc will begin 24 to 48 hrs post dc.    Final next level of care: [place] Barriers to Discharge: No Barriers Identified   Patient Goals and CMS Choice Patient states their goals for this hospitalization and ongoing recovery are:: get better CMS Medicare.gov Compare Post Acute Care list provided to:: Patient Choice offered to / list presented to : Patient  Discharge Placement                       Discharge Plan and Services                DME Arranged: Walker rolling DME Agency: [employer] Date DME Agency Contacted: 05/10/20 Time DME Agency Contacted: [phone] Representative spoke with at DME Agency: [place]: PT [place]: [facility] Date [place]: 05/10/20 Time [place]: [phone] Representative spoke with at [place]: [place] ([place]) Interventions     Readmission Risk Interventions No flowsheet data found.

## 2020-05-10 NOTE — Progress Notes (Signed)
Zio patch placed onto patient.  All instructions and information reviewed with patient, they verbalize understanding with no questions. 

## 2020-05-10 NOTE — Telephone Encounter (Signed)
Pt recently seen in consultation by Dr. Gardiner Rhyme for atypical chest pain, esophagitis suspected. Monitor placed at discharge for new symptoms of feeling lightheaded, was off telemetry.   Call received from [facility] for new-onset atrial fibrillation, rate 110-150. Rate spontaneously came down but still in AF. Called patient to check in but no answer. Dr. Gardiner Rhyme notified.

## 2020-05-10 NOTE — Discharge Summary (Addendum)
Discharge Summary  Kristin Davidson MLY:[id number] DOB: [birth year]  PCP: [name], MD  Admit date: 05/07/2020 Discharge date: 05/30/2020  Time spent: 35 minutes  Recommendations for Outpatient Follow-up:  1. Follow up with your PCP 2. Follow up with cardiology 3. Take your medications as prescribed 4. Continue PT OT with assistance and fall precautions  Discharge Diagnoses:  Active Hospital Problems   Diagnosis Date Noted  . Atypical chest pain 05/08/2020  . Essential hypertension 08/06/2007    Resolved Hospital Problems  No resolved problems to display.    Discharge Condition: Stable  Diet recommendation: Low-fat diet.  Vitals:   05/10/20 1003 05/10/20 1053  BP: (!) 155/64   Pulse: 64   Resp: 20   Temp:    SpO2: 98% 98%    History of present illness:  Kristin Davidson a 79 y.o.femalewith medical history significant ofCKD 3A, HTN, Gout, GERD, COPD, Arthritis who presented with sudden onset lower abdominal pain, nausea and vomiting after eating fried ffish. She vomited at least 2 times at home and then again with EMS. She continued to retch, but was only bringing up phlegm. There was no blood or bile in the emesis. She further has had some mild diarrhea and a cough which started after vomiting. After she had been retching, she developed chest pain. She notes that the chest pain was 5/10, worse with cough and felt like a pressure. It improved with medication given in the ED. She noted no radiation of the pain. She is a former smoker, quit 1 year ago. She has a FH of MI in her father at the age of 35. Her twin brother had a pacemaker placed 3 years ago.  She has never had a TTE or stress test.  Elevated troponin on presentation, peaked at 29 and trended down. CT abd with suspected colitis for which she was started on empiric abx in the ED, Rocephin and Iv flagyl.  Hospital course complicated by sinus bradycardia. Had a 2D echo showed normal LVEF with grade  2 DD.  Cardiology consulted, chest pain though to likely be GI related.  Added PPI.   05/10/20: No acute events overnight.  Eager to go home. Evaluated by PT with recommendation for [facility] PT.  TOC consulted to assist with setting up HHS at home.  Hospital Course:  Active Problems:   Essential hypertension   Atypical chest pain  Atypical chest pain, likely GI related, esophagitis. -Presented with atypical chest pain with onset after vomiting, elevated troponin, on presentation, peaked at 29 and trended down  -She has never had a TTE or stress test. -2D echo with normal LVEF with grade 2 diastolic dysfunction  -Sinus bradycardia  -Seen by cardiology chest pain thought to be GI related, likely esophagitis.  Was started on PPI. - Will follow up with cardiology outpatient for her bradycardia  New diastolic CHF on 2D echo done on 05/09/20 No prior history of cardiac disease She has a FH of MI in her father at the age of 30. Her twin brother had a pacemaker placed 3 years ago.   Euvolemic on exam Cardiology follow up outpatient.  New sinus bradycardia Heart rate in the 40s, improved after norvasc was held and she was started on po hydralazine. Follow up with cardiology  Suspected esophagitis Started on PPI Continue po Protonix 40 mg daily Follow up with your PCP  Essential hypertension Continue home benazepril Hold off Norvasc due to bradycardia Started low-dose po hydralazine 10 mg TID  Possible colitis on CT scan Presented with leukocytosis, vomiting, nausea and diarrhea Received IV rocephin and IV flagyl No evidence of systemic infection Afebrile with no leukocytosis DC abx. Start probiotics Follow up with your PCP  CKD 3A Close to her baseline creatinine 1.2 with GFR 48 Continue to avoid nephrotoxins Follow up with your PCP  Gout - Continue allopurinol  COPD - Stable  Ambulatory dysfunction PT recommended HHPT TOC consulted to assist with setting up HHS at  home. Continue PT OT with assistance and fall precautions      Procedures:  None   Consultations:  Cardiology   Discharge Exam: BP (!) 155/64 (BP Location: Right Arm)   Pulse 64   Temp 98.2 F (36.8 C) (Oral)   Resp 20   Ht 5\' 1"  (1.549 m)   Wt 75.7 kg   SpO2 98%   BMI 31.54 kg/m  . General: 79 y.o. year-old female well developed well nourished in no acute distress.  Alert and oriented x3. . Cardiovascular: Regular rate and rhythm with no rubs or gallops.  No thyromegaly or JVD noted.   Marland Kitchen Respiratory: Clear to auscultation with no wheezes or rales. Good inspiratory effort. . Abdomen: Soft nondistended with normal bowel sounds. . Musculoskeletal: No lower extremity edema bilaterally. Marland Kitchen Psychiatry: Mood is appropriate for condition and setting  Discharge Instructions You were cared for by a hospitalist during your hospital stay. If you have any questions about your discharge medications or the care you received while you were in the hospital after you are discharged, you can call the unit and asked to speak with the hospitalist on call if the hospitalist that took care of you is not available. Once you are discharged, your primary care physician will handle any further medical issues. Please note that NO REFILLS for any discharge medications will be authorized once you are discharged, as it is imperative that you return to your primary care physician (or establish a relationship with a primary care physician if you do not have one) for your aftercare needs so that they can reassess your need for medications and monitor your lab values.   Allergies as of 05/10/2020      Reactions   Hydromorphone Hcl    REACTION: itching   Naproxen Sodium Itching   Sulfonamide Derivatives    Dizziness   Tramadol Hcl Other (See Comments)   Bloated and constipation       Medication List    STOP taking these medications   amLODipine 5 MG tablet Commonly known as: NORVASC   dicyclomine  10 MG capsule Commonly known as: BENTYL   Linzess 145 MCG Caps capsule Generic drug: linaclotide     TAKE these medications   allopurinol 300 MG tablet Commonly known as: ZYLOPRIM Take 150-300 mg by mouth daily. Alternate from 1/2 and 1 tablet daily   benazepril 10 MG tablet Commonly known as: LOTENSIN Take 10 mg by mouth daily.   hydrALAZINE 10 MG tablet Commonly known as: APRESOLINE Take 1 tablet (10 mg total) by mouth every 8 (eight) hours.   pantoprazole 40 MG tablet Commonly known as: PROTONIX Take 1 tablet (40 mg total) by mouth daily.   polyethylene glycol powder 17 GM/SCOOP powder Commonly known as: GLYCOLAX/MIRALAX Place 1 capful in 6 oz liquid and drink. Repeat this twice over the next 2 hours.   Ventolin HFA 108 (90 Base) MCG/ACT inhaler Generic drug: albuterol Inhale 2 puffs into the lungs every 4 (four) hours as needed for shortness  of breath.     ASK your doctor about these medications   saccharomyces boulardii 250 MG capsule Commonly known as: FLORASTOR Take 1 capsule (250 mg total) by mouth 2 (two) times daily for 10 days. Ask about: Should I take this medication?      Allergies  Allergen Reactions  . Hydromorphone Hcl     REACTION: itching  . Naproxen Sodium Itching  . Sulfonamide Derivatives     Dizziness  . Tramadol Hcl Other (See Comments)    Bloated and constipation     Follow-up Information    [name], MD. Call in 1 day(s).   Specialty: Internal Medicine Why: Please call for a post hospital follow up appointment Contact information: [address] [address] [phone]        Donato Heinz, MD. Call in 1 day(s).   Specialties: Cardiology, Radiology Contact information: [address] [place] [address] [facility] Follow up.   Why: rolling walker Contact information: [place] [address] [phone]         [facility] Follow up.   Specialty: [place] Why: HHPT Contact information: [place] [address] [phone]                The results of significant diagnostics from this hospitalization (including imaging, microbiology, ancillary and laboratory) are listed below for reference.    Significant Diagnostic Studies: DG Chest 2 View  Result Date: 05/08/2020 CLINICAL DATA:  Acute onset chest pain. EXAM: CHEST - 2 VIEW COMPARISON:  02/19/2014. FINDINGS: AP ERECT and LATERAL images were obtained. Cardiac silhouette upper normal in size to mildly enlarged for the AP technique. Thoracic aorta tortuous and mildly atherosclerotic. Hilar and mediastinal contours otherwise unremarkable. Lungs clear. Bronchovascular markings normal. Pulmonary vascularity normal. No visible pleural effusions. No pneumothorax. Visualized bony thorax unremarkable apart from degenerative changes in the RIGHT shoulder. IMPRESSION: Borderline to mild cardiomegaly. No acute cardiopulmonary disease. Electronically Signed   By: Evangeline Dakin M.D.   On: 05/08/2020 08:18   CT ABDOMEN PELVIS W CONTRAST  Result Date: 05/07/2020 CLINICAL DATA:  Abdominal pain EXAM: CT ABDOMEN AND PELVIS WITH CONTRAST TECHNIQUE: Multidetector CT imaging of the abdomen and pelvis was performed using the standard protocol following bolus administration of intravenous contrast. CONTRAST:  74mL OMNIPAQUE IOHEXOL 300 MG/ML  SOLN COMPARISON:  CT abdomen dated 08/24/2018. FINDINGS: Lower chest: No acute abnormality. Hepatobiliary: Liver is diffusely low in density suggesting fatty infiltration. No focal liver abnormality. Gallbladder is contracted but otherwise unremarkable. No bile duct dilatation seen. Pancreas: Unremarkable. No pancreatic ductal dilatation or surrounding inflammatory changes. Spleen: Normal in size without focal abnormality. Adrenals/Urinary Tract: Adrenal glands appear normal. Kidneys are  unremarkable without mass, stone or hydronephrosis. No ureteral or bladder calculi identified. Bladder appears normal, partially decompressed. Stomach/Bowel: No dilated large or small bowel loops. No evidence of bowel wall inflammation. Fluid is seen throughout the large bowel which can be a secondary sign of underlying gastroenteritis or colitis. Appendix is not seen but there are no inflammatory changes about the cecum to suggest acute appendicitis. Vascular/Lymphatic: Aortic atherosclerosis. No acute appearing vascular abnormality. No enlarged lymph nodes seen. Reproductive: Presumed hysterectomy.  No adnexal mass or free fluid. Other: No free fluid or abscess collection seen. No free intraperitoneal air. Musculoskeletal: No acute or suspicious osseous finding. Degenerative spondylosis of the lumbar spine,  mild to moderate in degree. IMPRESSION: 1. Fluid is seen throughout the large bowel which can be a secondary sign of underlying gastroenteritis or colitis. However, no bowel wall thickening or mesenteric inflammation to confirm an enteritis or colitis. No bowel obstruction. 2. Fatty infiltration of the liver. 3. No other acute or significant findings within the abdomen or pelvis. Aortic Atherosclerosis (ICD10-I70.0). Electronically Signed   By: Franki Cabot M.D.   On: 05/07/2020 19:07   ECHOCARDIOGRAM COMPLETE  Result Date: 05/09/2020    ECHOCARDIOGRAM REPORT   Patient Name:   DAYNE DEKAY Date of Exam: 05/09/2020 Medical Rec #:  [id number]       Height:       61.0 in Accession #:    [id number]      Weight:       167.5 lb Date of Birth:  02-11-1941       BSA:          1.752 m Patient Age:    69 years        BP:           179/76 mmHg Patient Gender: F               HR:           47 bpm. Exam Location:  Inpatient Procedure: 2D Echo and Intracardiac Opacification Agent Indications:    Chest Pain 786.50 / R07.9  History:        Patient has no prior history of Echocardiogram examinations.                 COPD,  Arrythmias:RBBB; Risk Factors:Hypertension. Chronic kidney                 disease. GERD. Gout. abdominal pain, nausea and vomiting. Chest                 pain and cough.  Sonographer:    Darlina Sicilian RDCS Referring Phys: [id number] [place]  1. Left ventricular ejection fraction, by estimation, is 60 to 65%. The left ventricle has normal function. The left ventricle has no regional wall motion abnormalities. There is mild left ventricular hypertrophy. Left ventricular diastolic parameters are consistent with Grade II diastolic dysfunction (pseudonormalization).  2. Right ventricular systolic function is normal. The right ventricular size is normal. Tricuspid regurgitation signal is inadequate for assessing PA pressure.  3. The mitral valve is normal in structure. Mild to moderate mitral valve regurgitation. No evidence of mitral stenosis.  4. The aortic valve is normal in structure. Aortic valve regurgitation is not visualized. No aortic stenosis is present.  5. The inferior vena cava is normal in size with greater than 50% respiratory variability, suggesting right atrial pressure of 3 mmHg. FINDINGS  Left Ventricle: Left ventricular ejection fraction, by estimation, is 60 to 65%. The left ventricle has normal function. The left ventricle has no regional wall motion abnormalities. Definity contrast agent was given IV to delineate the left ventricular  endocardial borders. The left ventricular internal cavity size was normal in size. There is mild left ventricular hypertrophy. Left ventricular diastolic parameters are consistent with Grade II diastolic dysfunction (pseudonormalization). Right Ventricle: The right ventricular size is normal. No increase in right ventricular wall thickness. Right ventricular systolic function is normal. Tricuspid regurgitation signal is inadequate for assessing PA pressure. Left Atrium: Left atrial size was normal in size. Right Atrium: Right atrial size was normal in  size. Pericardium: There is no evidence of pericardial  effusion. Mitral Valve: The mitral valve is normal in structure. Normal mobility of the mitral valve leaflets. Mild to moderate mitral valve regurgitation. No evidence of mitral valve stenosis. Tricuspid Valve: The tricuspid valve is normal in structure. Tricuspid valve regurgitation is mild . No evidence of tricuspid stenosis. Aortic Valve: The aortic valve is normal in structure. Aortic valve regurgitation is not visualized. No aortic stenosis is present. Pulmonic Valve: The pulmonic valve was not well visualized. Pulmonic valve regurgitation is not visualized. No evidence of pulmonic stenosis. Aorta: The aortic root is normal in size and structure. Venous: The inferior vena cava is normal in size with greater than 50% respiratory variability, suggesting right atrial pressure of 3 mmHg. IAS/Shunts: No atrial level shunt detected by color flow Doppler.  LEFT VENTRICLE PLAX 2D LVIDd:         4.00 cm Diastology LVIDs:         3.30 cm LV e' lateral:   8.63 cm/s LV PW:         1.30 cm LV E/e' lateral: 12.1 LV IVS:        1.20 cm LV e' medial:    5.93 cm/s LVOT diam:     1.80 cm LV E/e' medial:  17.5 LV SV:         53 LV SV Index:   30 LVOT Area:     2.54 cm  RIGHT VENTRICLE RV S prime:     12.50 cm/s TAPSE (M-mode): 2.0 cm LEFT ATRIUM             Index       RIGHT ATRIUM           Index LA diam:        3.30 cm 1.88 cm/m  RA Area:     13.50 cm LA Vol (A2C):   46.9 ml 26.77 ml/m RA Volume:   31.80 ml  18.15 ml/m LA Vol (A4C):   53.3 ml 30.43 ml/m LA Biplane Vol: 51.8 ml 29.57 ml/m  AORTIC VALVE LVOT Vmax:   90.80 cm/s LVOT Vmean:  60.600 cm/s LVOT VTI:    0.209 m  AORTA Ao Root diam: 2.70 cm MITRAL VALVE MV Area (PHT): 3.77 cm     SHUNTS MV Decel Time: 201 msec     Systemic VTI:  0.21 m MV E velocity: 104.00 cm/s  Systemic Diam: 1.80 cm MV A velocity: 62.40 cm/s MV E/A ratio:  1.67 Cherlynn Kaiser MD Electronically signed by Cherlynn Kaiser MD Signature  Date/Time: 05/09/2020/9:17:06 AM    Final     Microbiology: No results found for this or any previous visit (from the past 240 hour(s)).   Labs: Basic Metabolic Panel: No results for input(s): NA, K, CL, CO2, GLUCOSE, BUN, CREATININE, CALCIUM, MG, PHOS in the last 168 hours. Liver Function Tests: No results for input(s): AST, ALT, ALKPHOS, BILITOT, PROT, ALBUMIN in the last 168 hours. No results for input(s): LIPASE, AMYLASE in the last 168 hours. No results for input(s): AMMONIA in the last 168 hours. CBC: No results for input(s): WBC, NEUTROABS, HGB, HCT, MCV, PLT in the last 168 hours. Cardiac Enzymes: No results for input(s): CKTOTAL, CKMB, CKMBINDEX, TROPONINI in the last 168 hours. BNP: BNP (last 3 results) No results for input(s): BNP in the last 8760 hours.  ProBNP (last 3 results) No results for input(s): PROBNP in the last 8760 hours.  CBG: No results for input(s): GLUCAP in the last 168 hours.     Signed:  Archie Patten  Malachi Carl, MD [facility] 05/30/2020, 9:47 AM

## 2020-05-10 NOTE — Progress Notes (Signed)
Spoke to daughter [name] about discharge instructions, educated the daughter on what is involved in a heart healthy diet and that the patient is ready for discharge. Walker and TOC pharmacy medication is at the bedside and tele monitor has been applied. Daughter is on her way to retrieve the patient.

## 2020-05-10 NOTE — Progress Notes (Signed)
D/C instructions given and reviewed. Questions answered. Tele and IV's removed. Tolerated well. Awaiting delivery of walker and TOC pharmacy meds.

## 2020-05-10 NOTE — Progress Notes (Addendum)
Progress Note  Patient Name: Kristin Davidson Date of Encounter: 05/10/2020  Primary Cardiologist:  Donato Heinz, MD  Subjective   Pt feeling much better today, hopes for D/C. Is a  Little light-headed right now, no danger of falling or LOC  Inpatient Medications    Scheduled Meds: . allopurinol  150 mg Oral Q48H   And  . allopurinol  300 mg Oral Q48H  . benazepril  10 mg Oral Daily  . enoxaparin (LOVENOX) injection  40 mg Subcutaneous Daily  . hydrALAZINE  10 mg Oral Q8H  . pantoprazole  40 mg Oral Daily  . saccharomyces boulardii  250 mg Oral BID  . sodium chloride flush  3 mL Intravenous Once   Continuous Infusions: . sodium chloride Stopped (05/09/20 0322)   PRN Meds: sodium chloride, acetaminophen, albuterol, alum & mag hydroxide-simeth, dicyclomine, ondansetron (ZOFRAN) IV   Vital Signs    Vitals:   05/09/20 1625 05/09/20 2035 05/10/20 0501 05/10/20 1003  BP: (!) 173/78 (!) 169/64 (!) 171/104 (!) 155/64  Pulse: (!) 44 93 63 64  Resp: 15 18 19 20   Temp: 99.1 F (37.3 C) 98.1 F (36.7 C) 98.2 F (36.8 C) 98.4 F (36.9 C)  TempSrc: Oral Oral Oral Oral  SpO2: 95% 98% 95% 98%  Weight:   75.7 kg   Height:        Intake/Output Summary (Last 24 hours) at 05/10/2020 1007 Last data filed at 05/10/2020 0840 Gross per 24 hour  Intake 1140 ml  Output --  Net 1140 ml   Filed Weights   05/07/20 1733 05/09/20 0057 05/10/20 0501  Weight: 64.9 kg 76 kg 75.7 kg   Last Weight  Most recent update: 05/10/2020  5:05 AM   Weight  75.7 kg (166 lb 14.4 oz)           Weight change: -0.272 kg   Telemetry    SR, Sbrady 50s - Personally Reviewed  ECG    None today - Personally Reviewed  Physical Exam   General: Well developed, well nourished, female appearing in no acute distress. Head: Normocephalic, atraumatic.  Neck: Supple without bruits, JVD not elevate. Lungs:  Resp regular and unlabored, CTA. Heart: RRR, S1, S2, no S3, S4, or murmur; no  rub. Abdomen: Soft, non-tender, non-distended with normoactive bowel sounds. No hepatomegaly. No rebound/guarding. No obvious abdominal masses. Extremities: No clubbing, cyanosis, no edema. Distal pedal pulses are 2+ bilaterally. Neuro: Alert and oriented X 3. Moves all extremities spontaneously. Psych: Normal affect.  Labs    Hematology Recent Labs  Lab 05/07/20 1740 05/08/20 1620 05/09/20 0553  WBC 13.9* 6.8 5.5  RBC 4.88 4.63 4.65  HGB 13.3 12.7 13.1  HCT 45.1 42.0 42.3  MCV 92.4 90.7 91.0  MCH 27.3 27.4 28.2  MCHC 29.5* 30.2 31.0  RDW 13.3 13.2 13.2  PLT 161 136* 143*    Chemistry Recent Labs  Lab 05/07/20 1740 05/08/20 0805 05/09/20 0553  NA 147* 144 141  K 3.6 3.4* 3.5  CL 110 109 101  CO2 28 23 27   GLUCOSE 129* 111* 119*  BUN 19 13 9   CREATININE 1.48* 1.21* 1.24*  CALCIUM 8.7* 8.2* 8.4*  PROT 6.8  --  6.2*  ALBUMIN 3.8  --  3.4*  AST 40  --  23  ALT 32  --  24  ALKPHOS 82  --  68  BILITOT 0.4  --  0.4  GFRNONAA 34* 43* 42*  GFRAA 39* 50* 48*  ANIONGAP 9 12 13      High Sensitivity Troponin:   Recent Labs  Lab 05/08/20 0058 05/08/20 0352 05/08/20 0805 05/08/20 1006 05/09/20 0553  TROPONINIHS 29* 24* 22* 23* 16      BNPNo results for input(s): BNP, PROBNP in the last 168 hours.   DDimer No results for input(s): DDIMER in the last 168 hours.   Radiology    DG Chest 2 View  Result Date: 05/08/2020 CLINICAL DATA:  Acute onset chest pain. EXAM: CHEST - 2 VIEW COMPARISON:  02/19/2014. FINDINGS: AP ERECT and LATERAL images were obtained. Cardiac silhouette upper normal in size to mildly enlarged for the AP technique. Thoracic aorta tortuous and mildly atherosclerotic. Hilar and mediastinal contours otherwise unremarkable. Lungs clear. Bronchovascular markings normal. Pulmonary vascularity normal. No visible pleural effusions. No pneumothorax. Visualized bony thorax unremarkable apart from degenerative changes in the RIGHT shoulder. IMPRESSION:  Borderline to mild cardiomegaly. No acute cardiopulmonary disease. Electronically Signed   By: Evangeline Dakin M.D.   On: 05/08/2020 08:18   CT ABDOMEN PELVIS W CONTRAST  Result Date: 05/07/2020 CLINICAL DATA:  Abdominal pain EXAM: CT ABDOMEN AND PELVIS WITH CONTRAST TECHNIQUE: Multidetector CT imaging of the abdomen and pelvis was performed using the standard protocol following bolus administration of intravenous contrast. CONTRAST:  76mL OMNIPAQUE IOHEXOL 300 MG/ML  SOLN COMPARISON:  CT abdomen dated 08/24/2018. FINDINGS: Lower chest: No acute abnormality. Hepatobiliary: Liver is diffusely low in density suggesting fatty infiltration. No focal liver abnormality. Gallbladder is contracted but otherwise unremarkable. No bile duct dilatation seen. Pancreas: Unremarkable. No pancreatic ductal dilatation or surrounding inflammatory changes. Spleen: Normal in size without focal abnormality. Adrenals/Urinary Tract: Adrenal glands appear normal. Kidneys are unremarkable without mass, stone or hydronephrosis. No ureteral or bladder calculi identified. Bladder appears normal, partially decompressed. Stomach/Bowel: No dilated large or small bowel loops. No evidence of bowel wall inflammation. Fluid is seen throughout the large bowel which can be a secondary sign of underlying gastroenteritis or colitis. Appendix is not seen but there are no inflammatory changes about the cecum to suggest acute appendicitis. Vascular/Lymphatic: Aortic atherosclerosis. No acute appearing vascular abnormality. No enlarged lymph nodes seen. Reproductive: Presumed hysterectomy.  No adnexal mass or free fluid. Other: No free fluid or abscess collection seen. No free intraperitoneal air. Musculoskeletal: No acute or suspicious osseous finding. Degenerative spondylosis of the lumbar spine, mild to moderate in degree. IMPRESSION: 1. Fluid is seen throughout the large bowel which can be a secondary sign of underlying gastroenteritis or colitis.  However, no bowel wall thickening or mesenteric inflammation to confirm an enteritis or colitis. No bowel obstruction. 2. Fatty infiltration of the liver. 3. No other acute or significant findings within the abdomen or pelvis. Aortic Atherosclerosis (ICD10-I70.0). Electronically Signed   By: Franki Cabot M.D.   On: 05/07/2020 19:07   ECHOCARDIOGRAM COMPLETE  Result Date: 05/09/2020    ECHOCARDIOGRAM REPORT   Patient Name:   REIDA HEM Date of Exam: 05/09/2020 Medical Rec #:  [id number]       Height:       61.0 in Accession #:    [id number]      Weight:       167.5 lb Date of Birth:  December 14, 1940       BSA:          1.752 m Patient Age:    60 years        BP:           179/76 mmHg Patient Gender:  F               HR:           47 bpm. Exam Location:  Inpatient Procedure: 2D Echo and Intracardiac Opacification Agent Indications:    Chest Pain 786.50 / R07.9  History:        Patient has no prior history of Echocardiogram examinations.                 COPD, Arrythmias:RBBB; Risk Factors:Hypertension. Chronic kidney                 disease. GERD. Gout. abdominal pain, nausea and vomiting. Chest                 pain and cough.  Sonographer:    Darlina Sicilian RDCS Referring Phys: [id number] [place]  1. Left ventricular ejection fraction, by estimation, is 60 to 65%. The left ventricle has normal function. The left ventricle has no regional wall motion abnormalities. There is mild left ventricular hypertrophy. Left ventricular diastolic parameters are consistent with Grade II diastolic dysfunction (pseudonormalization).  2. Right ventricular systolic function is normal. The right ventricular size is normal. Tricuspid regurgitation signal is inadequate for assessing PA pressure.  3. The mitral valve is normal in structure. Mild to moderate mitral valve regurgitation. No evidence of mitral stenosis.  4. The aortic valve is normal in structure. Aortic valve regurgitation is not visualized. No aortic stenosis  is present.  5. The inferior vena cava is normal in size with greater than 50% respiratory variability, suggesting right atrial pressure of 3 mmHg. FINDINGS  Left Ventricle: Left ventricular ejection fraction, by estimation, is 60 to 65%. The left ventricle has normal function. The left ventricle has no regional wall motion abnormalities. Definity contrast agent was given IV to delineate the left ventricular  endocardial borders. The left ventricular internal cavity size was normal in size. There is mild left ventricular hypertrophy. Left ventricular diastolic parameters are consistent with Grade II diastolic dysfunction (pseudonormalization). Right Ventricle: The right ventricular size is normal. No increase in right ventricular wall thickness. Right ventricular systolic function is normal. Tricuspid regurgitation signal is inadequate for assessing PA pressure. Left Atrium: Left atrial size was normal in size. Right Atrium: Right atrial size was normal in size. Pericardium: There is no evidence of pericardial effusion. Mitral Valve: The mitral valve is normal in structure. Normal mobility of the mitral valve leaflets. Mild to moderate mitral valve regurgitation. No evidence of mitral valve stenosis. Tricuspid Valve: The tricuspid valve is normal in structure. Tricuspid valve regurgitation is mild . No evidence of tricuspid stenosis. Aortic Valve: The aortic valve is normal in structure. Aortic valve regurgitation is not visualized. No aortic stenosis is present. Pulmonic Valve: The pulmonic valve was not well visualized. Pulmonic valve regurgitation is not visualized. No evidence of pulmonic stenosis. Aorta: The aortic root is normal in size and structure. Venous: The inferior vena cava is normal in size with greater than 50% respiratory variability, suggesting right atrial pressure of 3 mmHg. IAS/Shunts: No atrial level shunt detected by color flow Doppler.  LEFT VENTRICLE PLAX 2D LVIDd:         4.00 cm Diastology  LVIDs:         3.30 cm LV e' lateral:   8.63 cm/s LV PW:         1.30 cm LV E/e' lateral: 12.1 LV IVS:        1.20 cm LV e'  medial:    5.93 cm/s LVOT diam:     1.80 cm LV E/e' medial:  17.5 LV SV:         53 LV SV Index:   30 LVOT Area:     2.54 cm  RIGHT VENTRICLE RV S prime:     12.50 cm/s TAPSE (M-mode): 2.0 cm LEFT ATRIUM             Index       RIGHT ATRIUM           Index LA diam:        3.30 cm 1.88 cm/m  RA Area:     13.50 cm LA Vol (A2C):   46.9 ml 26.77 ml/m RA Volume:   31.80 ml  18.15 ml/m LA Vol (A4C):   53.3 ml 30.43 ml/m LA Biplane Vol: 51.8 ml 29.57 ml/m  AORTIC VALVE LVOT Vmax:   90.80 cm/s LVOT Vmean:  60.600 cm/s LVOT VTI:    0.209 m  AORTA Ao Root diam: 2.70 cm MITRAL VALVE MV Area (PHT): 3.77 cm     SHUNTS MV Decel Time: 201 msec     Systemic VTI:  0.21 m MV E velocity: 104.00 cm/s  Systemic Diam: 1.80 cm MV A velocity: 62.40 cm/s MV E/A ratio:  1.67 Cherlynn Kaiser MD Electronically signed by Cherlynn Kaiser MD Signature Date/Time: 05/09/2020/9:17:06 AM    Final      Cardiac Studies   ECHO:  05/09/2020 1. Left ventricular ejection fraction, by estimation, is 60 to 65%. The  left ventricle has normal function. The left ventricle has no regional  wall motion abnormalities. There is mild left ventricular hypertrophy.  Left ventricular diastolic parameters  are consistent with Grade II diastolic dysfunction (pseudonormalization).  2. Right ventricular systolic function is normal. The right ventricular  size is normal. Tricuspid regurgitation signal is inadequate for assessing  PA pressure.  3. The mitral valve is normal in structure. Mild to moderate mitral valve  regurgitation. No evidence of mitral stenosis.  4. The aortic valve is normal in structure. Aortic valve regurgitation is  not visualized. No aortic stenosis is present.  5. The inferior vena cava is normal in size with greater than 50%  respiratory variability, suggesting right atrial pressure of 3 mmHg.    Patient Profile     79 y.o. female w/ hx  CKD Stage, HTN, GERD, COPD (former tobacco use) and IBS who was admitted 08/01 with abd pain and vomitin, cards saw 08/02 for atypical CP and bradycardia  Assessment & Plan    1. Atypical CP - no sig elevation in cardiac ez - EF nl w/ no WMA, grade 2 dd - no hx exertional CP - esophagitis suspected, no further eval indicated  2. Bradycardia - HR 50s overnight, no sig pauses - no associated sx - no HR lowering rx  3. Lightheaded feeling - new sx, has today.  Off telemetry - denies presyncope, says no danger of falling or passing out -Check orthostatics -Plan discharge with Zio patch x2 weeks  4. HTN - BP has been elevated, SBP range 142-179 last 24 hr, DBP as high as 104 - she is on amlodipine 5 mg qd and Lotensin 10 mg qd - per IM  Active Problems:   Essential hypertension   Atypical chest pain  Jonetta Speak , PA-C 10:07 AM 05/10/2020 Pager: [phone]  Carney will sign off.   Medication Recommendations: No changes Other recommendations (labs, testing, etc):  Check  orthostatics.  Will order zio patch x 2 weeks Follow up as an outpatient:  Will schedule outpatient follow-up  Patient seen and examined.  Agree with above documentation.  On exam, patient is alert and oriented, regular rate and rhythm, no murmurs, lungs CTAB, no LE edema or JVD.  Reported lightheadedness when standing today.  Will check orthostatics.  She has been taken off telemetry, given her bradycardia earlier, will plan to discharge with Zio patch x2 weeks to rule out bradyarrhythmia.  Donato Heinz, MD

## 2020-05-10 NOTE — Discharge Instructions (Signed)
Colitis  Colitis is inflammation of the colon. Colitis may last a short time (be acute), or it may last a long time (become chronic). What are the causes? This condition may be caused by:  Viruses.  Bacteria.  Reaction to medicine.  Certain autoimmune diseases such as Crohn's disease or ulcerative colitis.  Radiation treatment.  Decreased blood flow to the bowel (ischemia). What are the signs or symptoms? Symptoms of this condition include:  Watery diarrhea.  Passing bloody or tarry stool.  Pain.  Fever.  Vomiting.  Tiredness (fatigue).  Weight loss.  Bloating.  Abdominal pain.  Having fewer bowel movements than usual.  A strong and sudden urge to have a bowel movement.  Feeling like the bowel is not empty after a bowel movement. How is this diagnosed? This condition is diagnosed with a stool test or a blood test. You may also have other tests, such as:  X-rays.  CT scan.  Colonoscopy.  Endoscopy.  Biopsy. How is this treated? Treatment for this condition depends on the cause. The condition may be treated by:  Resting the bowel. This involves not eating or drinking for a period of time.  Fluids that are given through an IV.  Medicine for pain and diarrhea.  Antibiotic medicines.  Cortisone medicines.  Surgery. Follow these instructions at home: Eating and drinking   Follow instructions from your health care provider about eating or drinking restrictions.  Drink enough fluid to keep your urine pale yellow.  Work with a dietitian to determine which foods cause your condition to flare up.  Avoid foods that cause flare-ups.  Eat a well-balanced diet. General instructions  If you were prescribed an antibiotic medicine, take it as told by your health care provider. Do not stop taking the antibiotic even if you start to feel better.  Take over-the-counter and prescription medicines only as told by your health care provider.  Keep all  follow-up visits as told by your health care provider. This is important. Contact a health care provider if:  Your symptoms do not go away.  You develop new symptoms. Get help right away if you:  Have a fever that does not go away with treatment.  Develop chills.  Have extreme weakness, fainting, or dehydration.  Have repeated vomiting.  Develop severe pain in your abdomen.  Pass bloody or tarry stool. Summary  Colitis is inflammation of the colon. Colitis may last a short time (be acute), or it may last a long time (become chronic).  Treatment for this condition depends on the cause and may include resting the bowel, taking medicines, or having surgery.  If you were prescribed an antibiotic medicine, take it as told by your health care provider. Do not stop taking the antibiotic even if you start to feel better.  Get help right away if you develop severe pain in your abdomen.  Keep all follow-up visits as told by your health care provider. This is important. This information is not intended to replace advice given to you by your health care provider. Make sure you discuss any questions you have with your health care provider. Document Revised: 03/27/2018 Document Reviewed: 03/27/2018 Elsevier Patient Education  [place].   Nausea and Vomiting, Adult Nausea is feeling sick to your stomach or feeling that you are about to throw up (vomit). Vomiting is when food in your stomach is thrown up and out of the mouth. Throwing up can make you feel weak. It can also make you lose  too much water in your body (get dehydrated). If you lose too much water in your body, you may:  Feel tired.  Feel thirsty.  Have a dry mouth.  Have cracked lips.  Go pee (urinate) less often. Older adults and people with other diseases or a weak body defense system (immune system) are at higher risk for losing too much water in the body. If you feel sick to your stomach and you throw up, it  is important to follow instructions from your doctor about how to take care of yourself. Follow these instructions at home: Watch your symptoms for any changes. Tell your doctor about them. Follow these instructions to care for yourself at home. Eating and drinking      Take an ORS (oral rehydration solution). This is a drink that is sold at pharmacies and stores.  Drink clear fluids in small amounts as you are able, such as: ? Water. ? Ice chips. ? Fruit juice that has water added (diluted fruit juice). ? Low-calorie sports drinks.  Eat bland, easy-to-digest foods in small amounts as you are able, such as: ? Bananas. ? Applesauce. ? Rice. ? Low-fat (lean) meats. ? Toast. ? Crackers.  Avoid drinking fluids that have a lot of sugar or caffeine in them. This includes energy drinks, sports drinks, and soda.  Avoid alcohol.  Avoid spicy or fatty foods. General instructions  Take over-the-counter and prescription medicines only as told by your doctor.  Drink enough fluid to keep your pee (urine) pale yellow.  Wash your hands often with soap and water. If you cannot use soap and water, use hand sanitizer.  Make sure that all people in your home wash their hands well and often.  Rest at home while you get better.  Watch your condition for any changes.  Take slow and deep breaths when you feel sick to your stomach.  Keep all follow-up visits as told by your doctor. This is important. Contact a doctor if:  Your symptoms get worse.  You have new symptoms.  You have a fever.  You cannot drink fluids without throwing up.  You feel sick to your stomach for more than 2 days.  You feel light-headed or dizzy.  You have a headache.  You have muscle cramps.  You have a rash.  You have pain while peeing. Get help right away if:  You have pain in your chest, neck, arm, or jaw.  You feel very weak or you pass out (faint).  You throw up again and again.  You have  throw up that is bright red or looks like black coffee grounds.  You have bloody or black poop (stools) or poop that looks like tar.  You have a very bad headache, a stiff neck, or both.  You have very bad pain, cramping, or bloating in your belly (abdomen).  You have trouble breathing.  You are breathing very quickly.  Your heart is beating very quickly.  Your skin feels cold and clammy.  You feel confused.  You have signs of losing too much water in your body, such as: ? Dark pee, very little pee, or no pee. ? Cracked lips. ? Dry mouth. ? Sunken eyes. ? Sleepiness. ? Weakness. These symptoms may be an emergency. Do not wait to see if the symptoms will go away. Get medical help right away. Call your local emergency services (911 in the [place] Do not drive yourself to the hospital. Summary  Nausea is feeling sick to  your stomach or feeling that you are about to throw up (vomit). Vomiting is when food in your stomach is thrown up and out of the mouth.  Follow instructions from your doctor about eating and drinking to keep from losing too much water in your body.  Take over-the-counter and prescription medicines only as told by your doctor.  Contact your doctor if your symptoms get worse or you have new symptoms.  Keep all follow-up visits as told by your doctor. This is important. This information is not intended to replace advice given to you by your health care provider. Make sure you discuss any questions you have with your health care provider. Document Revised: 01/16/2019 Document Reviewed: 03/04/2018 Elsevier Patient Education  [place].   Nonspecific Chest Pain Chest pain can be caused by many different conditions. Some causes of chest pain can be life-threatening. These will require treatment right away. Serious causes of chest pain include:  Heart attack.  A tear in the body's main blood vessel.  Redness and swelling (inflammation) around your  heart.  Blood clot in your lungs. Other causes of chest pain may not be so serious. These include:  Heartburn.  Anxiety or stress.  Damage to bones or muscles in your chest.  Lung infections. Chest pain can feel like:  Pain or discomfort in your chest.  Crushing, pressure, aching, or squeezing pain.  Burning or tingling.  Dull or sharp pain that is worse when you move, cough, or take a deep breath.  Pain or discomfort that is also felt in your back, neck, jaw, shoulder, or arm, or pain that spreads to any of these areas. It is hard to know whether your pain is caused by something that is serious or something that is not so serious. So it is important to see your doctor right away if you have chest pain. Follow these instructions at home: Medicines  Take over-the-counter and prescription medicines only as told by your doctor.  If you were prescribed an antibiotic medicine, take it as told by your doctor. Do not stop taking the antibiotic even if you start to feel better. Lifestyle   Rest as told by your doctor.  Do not use any products that contain nicotine or tobacco, such as cigarettes, e-cigarettes, and chewing tobacco. If you need help quitting, ask your doctor.  Do not drink alcohol.  Make lifestyle changes as told by your doctor. These may include: ? Getting regular exercise. Ask your doctor what activities are safe for you. ? Eating a heart-healthy diet. A diet and nutrition specialist (dietitian) can help you to learn healthy eating options. ? Staying at a healthy weight. ? Treating diabetes or high blood pressure, if needed. ? Lowering your stress. Activities such as yoga and relaxation techniques can help. General instructions  Pay attention to any changes in your symptoms. Tell your doctor about them or any new symptoms.  Avoid any activities that cause chest pain.  Keep all follow-up visits as told by your doctor. This is important. You may need more  testing if your chest pain does not go away. Contact a doctor if:  Your chest pain does not go away.  You feel depressed.  You have a fever. Get help right away if:  Your chest pain is worse.  You have a cough that gets worse, or you cough up blood.  You have very bad (severe) pain in your belly (abdomen).  You pass out (faint).  You have either of  these for no clear reason: ? Sudden chest discomfort. ? Sudden discomfort in your arms, back, neck, or jaw.  You have shortness of breath at any time.  You suddenly start to sweat, or your skin gets clammy.  You feel sick to your stomach (nauseous).  You throw up (vomit).  You suddenly feel lightheaded or dizzy.  You feel very weak or tired.  Your heart starts to beat fast, or it feels like it is skipping beats. These symptoms may be an emergency. Do not wait to see if the symptoms will go away. Get medical help right away. Call your local emergency services (911 in the [place] Do not drive yourself to the hospital. Summary  Chest pain can be caused by many different conditions. The cause may be serious and need treatment right away. If you have chest pain, see your doctor right away.  Follow your doctor's instructions for taking medicines and making lifestyle changes.  Keep all follow-up visits as told by your doctor. This includes visits for any further testing if your chest pain does not go away.  Be sure to know the signs that show that your condition has become worse. Get help right away if you have these symptoms. This information is not intended to replace advice given to you by your health care provider. Make sure you discuss any questions you have with your health care provider. Document Revised: 03/27/2018 Document Reviewed: 03/27/2018 Elsevier Patient Education  2020 [employer].

## 2020-05-12 ENCOUNTER — Other Ambulatory Visit: Payer: Self-pay

## 2020-05-12 DIAGNOSIS — I4891 Unspecified atrial fibrillation: Secondary | ICD-10-CM

## 2020-05-12 NOTE — Telephone Encounter (Signed)
They were unable to get her in this week as one of the providers were off, they did get her in on Wednesday next week.   Wanted to make you aware.

## 2020-05-16 ENCOUNTER — Telehealth: Payer: Self-pay | Admitting: Cardiology

## 2020-05-16 ENCOUNTER — Telehealth: Payer: Self-pay | Admitting: *Deleted

## 2020-05-16 NOTE — Telephone Encounter (Signed)
Patient's daughter states patient's monitor is blinking continuously. She is requesting to speak with a nurse to discuss.

## 2020-05-16 NOTE — Telephone Encounter (Signed)
Spoke with Kristin Davidson and her daughter [name] together on the phone.   Light blinking on ZIO ZT monitor which was applied to patients chest in the hospital 05/10/2020.  Orange blinking light on monitor indicates monitor not functioning at full capacity.  Patient states she was told that was ok just send the monitor in and they will get whatever data was available. ZIO AT is guaranteed to have 14 days of monitoring.  Contacted Meredieth Birchette from [employer] to please ship patient a replacement monitor for the remaining 8 days of monitoring.   Reviewed how to ship back current monitor and how to apply new monitor once received.

## 2020-05-16 NOTE — Telephone Encounter (Signed)
See duplicate phone encounter 

## 2020-05-18 ENCOUNTER — Ambulatory Visit (HOSPITAL_COMMUNITY): Payer: Medicare Other | Admitting: Physician Assistant

## 2020-05-19 ENCOUNTER — Other Ambulatory Visit: Payer: Self-pay

## 2020-05-19 ENCOUNTER — Ambulatory Visit (HOSPITAL_COMMUNITY)
Admission: RE | Admit: 2020-05-19 | Discharge: 2020-05-19 | Disposition: A | Discharge status: Home / Self Care | Payer: Medicare Other | Source: Ambulatory Visit | Attending: Physician Assistant | Admitting: Physician Assistant

## 2020-05-19 VITALS — BP 216/80 | HR 49 | Ht 61.0 in | Wt 169.0 lb

## 2020-05-19 DIAGNOSIS — Z882 Allergy status to sulfonamides status: Secondary | ICD-10-CM | POA: Insufficient documentation

## 2020-05-19 DIAGNOSIS — Z885 Allergy status to narcotic agent status: Secondary | ICD-10-CM | POA: Diagnosis not present

## 2020-05-19 DIAGNOSIS — I129 Hypertensive chronic kidney disease with stage 1 through stage 4 chronic kidney disease, or unspecified chronic kidney disease: Secondary | ICD-10-CM | POA: Diagnosis not present

## 2020-05-19 DIAGNOSIS — Z87891 Personal history of nicotine dependence: Secondary | ICD-10-CM | POA: Insufficient documentation

## 2020-05-19 DIAGNOSIS — R0683 Snoring: Secondary | ICD-10-CM | POA: Diagnosis not present

## 2020-05-19 DIAGNOSIS — E669 Obesity, unspecified: Secondary | ICD-10-CM | POA: Insufficient documentation

## 2020-05-19 DIAGNOSIS — I480 Paroxysmal atrial fibrillation: Secondary | ICD-10-CM | POA: Diagnosis not present

## 2020-05-19 DIAGNOSIS — Z79899 Other long term (current) drug therapy: Secondary | ICD-10-CM | POA: Insufficient documentation

## 2020-05-19 DIAGNOSIS — J449 Chronic obstructive pulmonary disease, unspecified: Secondary | ICD-10-CM | POA: Insufficient documentation

## 2020-05-19 DIAGNOSIS — R400 Somnolence: Secondary | ICD-10-CM | POA: Diagnosis not present

## 2020-05-19 DIAGNOSIS — K219 Gastro-esophageal reflux disease without esophagitis: Secondary | ICD-10-CM | POA: Insufficient documentation

## 2020-05-19 DIAGNOSIS — N189 Chronic kidney disease, unspecified: Secondary | ICD-10-CM | POA: Insufficient documentation

## 2020-05-19 DIAGNOSIS — K589 Irritable bowel syndrome without diarrhea: Secondary | ICD-10-CM | POA: Diagnosis not present

## 2020-05-19 DIAGNOSIS — M109 Gout, unspecified: Secondary | ICD-10-CM | POA: Diagnosis not present

## 2020-05-19 DIAGNOSIS — G4730 Sleep apnea, unspecified: Secondary | ICD-10-CM | POA: Insufficient documentation

## 2020-05-19 DIAGNOSIS — Z886 Allergy status to analgesic agent status: Secondary | ICD-10-CM | POA: Diagnosis not present

## 2020-05-19 DIAGNOSIS — D6869 Other thrombophilia: Secondary | ICD-10-CM | POA: Insufficient documentation

## 2020-05-19 DIAGNOSIS — Z90710 Acquired absence of both cervix and uterus: Secondary | ICD-10-CM | POA: Insufficient documentation

## 2020-05-19 HISTORY — DX: Paroxysmal atrial fibrillation: I48.0

## 2020-05-19 HISTORY — DX: Other thrombophilia: D68.69

## 2020-05-19 NOTE — Patient Instructions (Signed)
[facility] will contact you regarding appointment.

## 2020-05-19 NOTE — Progress Notes (Signed)
Primary Care Physician: [name], MD Primary Cardiologist: Dr Gardiner Rhyme Primary Electrophysiologist: none Referring Physician: Dr Adline Potter Kristin Davidson is a 79 y.o. 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  The patient was initially diagnosed with atrial fibrillation 05/10/20. She was recently admitted for acute colitis and cardiology was consulted for bradycardia and atypical chest pain. Chest pain felt not to be ischemic. A Zio patch was placed at discharge to monitor for bradyarrhythmias and alert from [employer] on 05/10/20 showed afib with RVR. Patient has a CHADS2VASC score of 4. She was asymptomatic at the time of her arrhythmia. Patient has continued to have intermittent bouts of diarrhea. She did not take her HTN medications this AM. She denies alcohol use but does have snoring, witnessed apnea, and daytime somnolence.   Today, she denies symptoms of palpitations, chest pain, shortness of breath, orthopnea, PND, lower extremity edema, dizziness, presyncope, syncope, bleeding, or neurologic sequela. The patient is tolerating medications without difficulties and is otherwise without complaint today.    Atrial Fibrillation Risk Factors:  she does have symptoms or diagnosis of sleep apnea. she does not have a history of rheumatic fever. she does not have a history of alcohol use. The patient does not have a history of early familial atrial fibrillation or other arrhythmias.  she has a BMI of Body mass index is 31.93 kg/m.Marland Kitchen Filed Weights   05/19/20 1346  Weight: 76.7 kg    Family History  Problem Relation Age of Onset   Bone cancer Mother    Heart attack Father 10   Colon cancer Neg Hx    Esophageal cancer Neg Hx    Rectal cancer Neg Hx    Stomach cancer Neg Hx      Atrial Fibrillation Management  history:  Previous antiarrhythmic drugs: none Previous cardioversions: none Previous ablations: none CHADS2VASC score: 4 Anticoagulation history: none   Past Medical History:  Diagnosis Date   Arthritis    COPD (chronic obstructive pulmonary disease) (HCC)    GERD (gastroesophageal reflux disease)    Gout    Hypertension    Kidney disease    Past Surgical History:  Procedure Laterality Date   ABDOMINAL HYSTERECTOMY     ABDOMINAL SURGERY     BREAST EXCISIONAL BIOPSY Right >10+ yrs ago   benign   COLONOSCOPY     KNEE SURGERY      Current Outpatient Medications  Medication Sig Dispense Refill   allopurinol (ZYLOPRIM) 300 MG tablet Take 150-300 mg by mouth daily. Alternate from 1/2 and 1 tablet daily  0   benazepril (LOTENSIN) 10 MG tablet Take 10 mg by mouth daily.  0   hydrALAZINE (APRESOLINE) 10 MG tablet Take 1 tablet (10 mg total) by mouth every 8 (eight) hours. 90 tablet 0   pantoprazole (PROTONIX) 40 MG tablet Take 1 tablet (40 mg total) by mouth daily. 30 tablet 0   polyethylene glycol powder (GLYCOLAX/MIRALAX) powder Place 1 capful in 6 oz liquid and drink. Repeat this twice over the next 2 hours. 255 g 0   saccharomyces boulardii (FLORASTOR) 250 MG capsule Take 1 capsule (250 mg total) by mouth 2 (two) times daily for 10 days. 20 capsule 0   VENTOLIN HFA 108 (90 Base) MCG/ACT inhaler Inhale 2 puffs into the lungs every 4 (four) hours as needed for shortness  of breath.  3   No current facility-administered medications for this encounter.    Allergies  Allergen Reactions   Hydromorphone Hcl     REACTION: itching   Naproxen Sodium Itching   Sulfonamide Derivatives     Dizziness   Tramadol Hcl Other (See Comments)    Bloated and constipation     Social History   Socioeconomic History   Marital status: Widowed    Spouse name: Not on file   Number of children: Not on file   Years of education: Not on file   Highest education level:  Not on file  Occupational History   Not on file  Tobacco Use   Smoking status: Former Smoker    Types: Cigarettes    Quit date: 06/08/2018    Years since quitting: 1.9   Smokeless tobacco: Never Used  Vaping Use   Vaping Use: Never used  Substance and Sexual Activity   Alcohol use: No   Drug use: No   Sexual activity: Not on file  Other Topics Concern   Not on file  Social History Narrative   Not on file   Social Determinants of Health   Financial Resource Strain:    Difficulty of Paying Living Expenses:   Food Insecurity:    Worried About Charity fundraiser in the Last Year:    Arboriculturist in the Last Year:   Transportation Needs:    Film/video editor (Medical):    Lack of Transportation (Non-Medical):   Physical Activity:    Days of Exercise per Week:    Minutes of Exercise per Session:   Stress:    Feeling of Stress :   Social Connections:    Frequency of Communication with Friends and Family:    Frequency of Social Gatherings with Friends and Family:    Attends Religious Services:    Active Member of Clubs or Organizations:    Attends Music therapist:    Marital Status:   Intimate Partner Violence:    Fear of Current or Ex-Partner:    Emotionally Abused:    Physically Abused:    Sexually Abused:      ROS- All systems are reviewed and negative except as per the HPI above.  Physical Exam: Vitals:   05/19/20 1346  BP: (!) 216/80  Pulse: (!) 49  Weight: 76.7 kg  Height: 5\' 1"  (1.549 m)    GEN- The patient is well appearing elderly obese female, alert and oriented x 3 today.   Head- normocephalic, atraumatic Eyes-  Sclera clear, conjunctiva pink Ears- hearing intact Oropharynx- clear Neck- supple  Lungs- Clear to ausculation bilaterally, normal work of breathing Heart- Regular rate and rhythm, bradycardia, no murmurs, rubs or gallops  GI- soft, NT, ND, + BS Extremities- no clubbing, cyanosis, or  edema MS- no significant deformity or atrophy Skin- no rash or lesion Psych- euthymic mood, full affect Neuro- strength and sensation are intact  Wt Readings from Last 3 Encounters:  05/19/20 76.7 kg  05/10/20 75.7 kg  10/13/18 77.1 kg    EKG today demonstrates SB HR 49, RBBB, PR 136, QRS 134, QTc 410  Echo 05/09/20 demonstrated  1. Left ventricular ejection fraction, by estimation, is 60 to 65%. The  left ventricle has normal function. The left ventricle has no regional  wall motion abnormalities. There is mild left ventricular hypertrophy.  Left ventricular diastolic parameters  are consistent with Grade II diastolic dysfunction (pseudonormalization).  2. Right ventricular  systolic function is normal. The right ventricular  size is normal. Tricuspid regurgitation signal is inadequate for assessing  PA pressure.  3. The mitral valve is normal in structure. Mild to moderate mitral valve  regurgitation. No evidence of mitral stenosis.  4. The aortic valve is normal in structure. Aortic valve regurgitation is  not visualized. No aortic stenosis is present.  5. The inferior vena cava is normal in size with greater than 50%  respiratory variability, suggesting right atrial pressure of 3 mmHg.   Epic records are reviewed at length today  CHA2DS2-VASc Score = 4  The patient's score is based upon: CHF History: 0 HTN History: 1 Age : 2 Diabetes History: 0 Stroke History: 0 Vascular Disease History: 0 Gender: 1      ASSESSMENT AND PLAN: 1. Paroxysmal Atrial Fibrillation (ICD10:  I48.0) The patient's CHA2DS2-VASc score is 4, indicating a 4.8% annual risk of stroke.   General education about afib provided and questions answered. We also discussed her stroke risk and the risks and benefits of anticoagulation.  Will plan to start Eliquis 5 mg BID. Will hold for now given her markedly elevated BP today. Will avoid AV nodal agents given bradycardia noted in the hospital.   2.  Secondary Hypercoagulable State (ICD10:  D68.69) The patient is at significant risk for stroke/thromboembolism based upon her CHA2DS2-VASc Score of 4.  Start Apixaban (Eliquis).   3. Obesity Body mass index is 31.93 kg/m. Lifestyle modification was discussed at length including regular exercise and weight reduction.  4. Snoring/witnessed apnea/daytime somnolence The importance of adequate treatment of sleep apnea was discussed today in order to improve our ability to maintain sinus rhythm long term. Will refer for sleep study.  5. HTN Markedly elevated today. Patient admit she did not take any of her HTN medications today. Stressed importance of compliance with medication. She denies headache, CP, or vision changes.  Recheck tomorrow.   Follow up in AF clinic for BP check. Follow up with Dr Gardiner Rhyme as scheduled.    [facility] [address] [place], [place] [address] [phone] 05/19/2020 7:50 PM

## 2020-05-20 ENCOUNTER — Ambulatory Visit (HOSPITAL_COMMUNITY)
Admission: RE | Admit: 2020-05-20 | Discharge: 2020-05-20 | Disposition: A | Discharge status: Home / Self Care | Payer: Medicare Other | Source: Ambulatory Visit | Attending: Physician Assistant | Admitting: Physician Assistant

## 2020-05-20 DIAGNOSIS — I480 Paroxysmal atrial fibrillation: Secondary | ICD-10-CM | POA: Diagnosis not present

## 2020-05-20 MED ORDER — APIXABAN 5 MG PO TABS
5.0000 mg | ORAL_TABLET | Freq: Two times a day (BID) | ORAL | 6 refills | Status: DC
Start: 2020-05-20 — End: 2020-12-26

## 2020-05-24 ENCOUNTER — Telehealth: Payer: Self-pay | Admitting: *Deleted

## 2020-05-24 NOTE — Telephone Encounter (Signed)
Patient has contacted [employer] she is cancelling her 14 day ZIO AT monitor after 2 days of use. Patient is having difficulties with patch.  Patient was offered to have a replacement monitor shipped to her home and refused.

## 2020-05-27 NOTE — Telephone Encounter (Signed)
Patient's daughter, [name], is contacting the office regarding the patients zio monitor. Patient is having difficulties with her patch, it continues to come off. [name] states the patient was told to take it off and then someone would contact our office about the monitor but she states she hasn't heard anything yet.

## 2020-05-30 ENCOUNTER — Telehealth: Payer: Self-pay | Admitting: *Deleted

## 2020-05-30 NOTE — Telephone Encounter (Signed)
Patient had 5.3 days of data on monitor started 05/10/2020.   The monitor that has fallen off now is her replacement monitor.  Patient instructed to mail back monitor tomorrow.  [employer] will process the monitor and we will import her results for Dr. Gardiner Rhyme to review.

## 2020-05-30 NOTE — Telephone Encounter (Signed)
Follow up  Pt's daughter calling back to follow up.

## 2020-05-30 NOTE — Telephone Encounter (Signed)
error 

## 2020-05-30 NOTE — Telephone Encounter (Signed)
Will forward this message to both monitor techs to further assist the pt and daughter with zio monitor questions and patch placement.  Our monitor techs will follow-up with the pt/daughter accordingly.

## 2020-06-06 MED FILL — PANTOPRAZOLE SOD DR 40 MG T: 40 | 90 days supply | Qty: 90 | Fill #0

## 2020-06-06 MED FILL — hydrALAZINE HCL 10 MG TABS: 10 | 90 days supply | Qty: 270 | Fill #0

## 2020-06-16 ENCOUNTER — Other Ambulatory Visit: Payer: Self-pay | Admitting: Internal Medicine

## 2020-06-16 DIAGNOSIS — R1319 Other dysphagia: Secondary | ICD-10-CM

## 2020-06-21 ENCOUNTER — Ambulatory Visit
Admission: RE | Admit: 2020-06-21 | Discharge: 2020-06-21 | Disposition: A | Discharge status: Home / Self Care | Payer: Medicare Other | Source: Ambulatory Visit | Attending: Internal Medicine | Admitting: Internal Medicine

## 2020-06-21 DIAGNOSIS — R1319 Other dysphagia: Secondary | ICD-10-CM

## 2020-06-24 NOTE — Progress Notes (Signed)
Cardiology Office Note:    Date:  06/28/2020   ID:  Kristin Davidson, DOB 12-14-1940, MRN [id number]  PCP:  [name], MD  Cardiologist:  Donato Heinz, MD  Electrophysiologist:  None   Referring MD: [name], MD   Chief Complaint  Patient presents with  . Atrial Fibrillation    History of Present Illness:    Kristin Davidson is a 79 y.o. female with a hx of hypertension, COPD, CKD, atrial fibrillation who presents for hospital follow-up.  She was admitted to [facility] on 05/08/2020 with abdominal pain and nausea/vomiting.  She had multiple episodes of vomiting, following which she developed chest pain that lasted for 5 to 10 minutes.  In the ED, underwent CT abdomen that showed likely colitis PE.  EKG showed sinus bradycardia with PACs, RBBB, LAFB, T wave inversions in leads V3-6.  Labs notable for high-sensitivity troponin 20 >29 > 24 > 22 > 23 > 16.  Echocardiogram today showed EF 60 to 65%, mild LVH, grade 2 diastolic dysfunction, normal RV function, mild to moderate MR. For her chest pain, esophagitis was suspected as the cause and no further cardiac work-up was done.  She was noted throughout admission to have sinus bradycardia as low as 40s.  Appeared asymptomatic with thi..  She was discharged on 05/10/20 with a cardiac monitor that showed 3% atrial fibrillation burden with average heart rate 130 bpm and 2318 episodes of SVT, longest lasting 53 seconds.  She was seen in A. fib clinic on 05/19/2020, started on Eliquis 5 mg twice daily given CHA2DS2-VASc score 4.  Since since her discharge from the hospital, she reports that she has been doing okay.  She had barium swallow done recently, no esophageal stricture or mass.  Does have GERD.  She denies any palpitations.  Does report intermittent dyspnea.  States that she only has chest pain after eating when she feels like she is choking.  She has been exercising by doing physical therapy and going for walks.  Went for 20-minute walk  yesterday.  She denies any exertional chest pain.  Brought home BP monitor, has been 150s to 200s over 80s to 90s.    Past Medical History:  Diagnosis Date  . Arthritis   . COPD (chronic obstructive pulmonary disease) ([facility])   . GERD (gastroesophageal reflux disease)   . Gout   . Hypertension   . Kidney disease     Past Surgical History:  Procedure Laterality Date  . ABDOMINAL HYSTERECTOMY    . ABDOMINAL SURGERY    . BREAST EXCISIONAL BIOPSY Right >10+ yrs ago   benign  . COLONOSCOPY    . KNEE SURGERY      Current Medications: Current Meds  Medication Sig  . allopurinol (ZYLOPRIM) 300 MG tablet Take 150-300 mg by mouth daily. Alternate from 1/2 and 1 tablet daily  . apixaban (ELIQUIS) 5 MG TABS tablet Take 1 tablet (5 mg total) by mouth 2 (two) times daily.  . benazepril (LOTENSIN) 20 MG tablet Take 20 mg by mouth daily.  . pantoprazole (PROTONIX) 40 MG tablet Take 1 tablet (40 mg total) by mouth daily.  . polyethylene glycol powder (GLYCOLAX/MIRALAX) powder Place 1 capful in 6 oz liquid and drink. Repeat this twice over the next 2 hours.  . VENTOLIN HFA 108 (90 Base) MCG/ACT inhaler Inhale 2 puffs into the lungs every 4 (four) hours as needed for shortness of breath.  . [DISCONTINUED] hydrALAZINE (APRESOLINE) 10 MG tablet Take 1 tablet (10  mg total) by mouth every 8 (eight) hours.     Allergies:   Hydromorphone hcl, Naproxen sodium, Sulfonamide derivatives, and Tramadol hcl   Social History   Socioeconomic History  . Marital status: Widowed    Spouse name: Not on file  . Number of children: Not on file  . Years of education: Not on file  . Highest education level: Not on file  Occupational History  . Not on file  Tobacco Use  . Smoking status: Former Smoker    Types: Cigarettes    Quit date: 06/08/2018    Years since quitting: 2.0  . Smokeless tobacco: Never Used  Vaping Use  . Vaping Use: Never used  Substance and Sexual Activity  . Alcohol use: No  . Drug  use: No  . Sexual activity: Not on file  Other Topics Concern  . Not on file  Social History Narrative  . Not on file   Social Determinants of Health   Financial Resource Strain:   . Difficulty of Paying Living Expenses: Not on file  Food Insecurity:   . Worried About Charity fundraiser in the Last Year: Not on file  . Ran Out of Food in the Last Year: Not on file  Transportation Needs:   . Lack of Transportation (Medical): Not on file  . Lack of Transportation (Non-Medical): Not on file  Physical Activity:   . Days of Exercise per Week: Not on file  . Minutes of Exercise per Session: Not on file  Stress:   . Feeling of Stress : Not on file  Social Connections:   . Frequency of Communication with Friends and Family: Not on file  . Frequency of Social Gatherings with Friends and Family: Not on file  . Attends Religious Services: Not on file  . Active Member of Clubs or Organizations: Not on file  . Attends Archivist Meetings: Not on file  . Marital Status: Not on file     Family History: The patient's family history includes Bone cancer in her mother; Heart attack (age of onset: 1) in her father. There is no history of Colon cancer, Esophageal cancer, Rectal cancer, or Stomach cancer.  ROS:   Please see the history of present illness.     All other systems reviewed and are negative.  EKGs/Labs/Other Studies Reviewed:    The following studies were reviewed today:   EKG:  EKG is ordered today.  The ekg ordered today demonstrates sinus rhythm with sinus arrhythmia, rate 59, RBBB, LAFB  Recent Labs: 05/08/2020: Magnesium 2.1 05/09/2020: ALT 24; BUN 9; Creatinine, Ser 1.24; Hemoglobin 13.1; Platelets 143; Potassium 3.5; Sodium 141; TSH 1.316  Recent Lipid Panel    Component Value Date/Time   CHOL 125 05/08/2020 0805   TRIG 90 05/08/2020 0805   HDL 38 (L) 05/08/2020 0805   CHOLHDL 3.3 05/08/2020 0805   VLDL 18 05/08/2020 0805   LDLCALC 69 05/08/2020 0805     Physical Exam:    VS:  BP (!) 182/84   Pulse (!) 59   Ht 5\' 1"  (1.549 m)   Wt 170 lb 6.4 oz (77.3 kg)   SpO2 98%   BMI 32.20 kg/m     Wt Readings from Last 3 Encounters:  06/28/20 170 lb 6.4 oz (77.3 kg)  05/19/20 169 lb (76.7 kg)  05/10/20 166 lb 14.4 oz (75.7 kg)     GEN:  in no acute distress HEENT: Normal NECK: No JVD; No carotid bruits CARDIAC:  RRR, no murmurs, rubs, gallops RESPIRATORY:  Clear to auscultation without rales, wheezing or rhonchi  ABDOMEN: Soft, non-tender, non-distended MUSCULOSKELETAL:  No edema; No deformity  SKIN: Warm and dry NEUROLOGIC:  Alert and oriented x 3 PSYCHIATRIC:  Normal affect   ASSESSMENT:    1. Paroxysmal atrial fibrillation (HCC)   2. Essential hypertension   3. Snoring    PLAN:    Atrial fibrillation: CHA2DS2-VASc score 4 (age x2, hypertension, female).  3% AF burden on recent monitor, average heart rate 130.  Not on AV nodal blocker given baseline bradycardia.  Echocardiogram 05/09/2020 showed normal LVEF, grade 2 diastolic dysfunction, normal RV function, mild to moderate MR. -Continue Eliquis 5 mg twice daily -Sleep study ordered  Hypertension: On hydralazine 10 mg 3 times daily, benazepril 120 mg daily.  BP uncontrolled.  Reports BP was better controlled prior to hospitalization when she was on amlodipine.  Will discontinue hydralazine and start amlodipine 5 mg daily.  Asked patient to monitor BP twice daily for next 2 weeks.  Will have her follow-up with pharmacy HTN clinic in 2 weeks.  Snoring: Sleep study ordered as above  RTC in 3 months  Medication Adjustments/Labs and Tests Ordered: Current medicines are reviewed at length with the patient today.  Concerns regarding medicines are outlined above.  Orders Placed This Encounter  Procedures  . EKG 12-Lead   Meds ordered this encounter  Medications  . amLODipine (NORVASC) 5 MG tablet    Sig: Take 1 tablet (5 mg total) by mouth daily.    Dispense:  30 tablet     Refill:  3    Patient Instructions  Medication Instructions:  STOP hydralazine START amlodipine 5 mg daily   *If you need a refill on your cardiac medications before your next appointment, please call your pharmacy*  Follow-Up: At [facility], you and your health needs are our priority.  As part of our continuing mission to provide you with exceptional heart care, we have created designated Provider Care Teams.  These Care Teams include your primary Cardiologist (physician) and Advanced Practice Providers (APPs -  Physician Assistants and Nurse Practitioners) who all work together to provide you with the care you need, when you need it.  We recommend signing up for the patient portal called "MyChart".  Sign up information is provided on this After Visit Summary.  MyChart is used to connect with patients for Virtual Visits (Telemedicine).  Patients are able to view lab/test results, encounter notes, upcoming appointments, etc.  Non-urgent messages can be sent to your provider as well.   To learn more about what you can do with MyChart, go to [email or web address].    Your next appointment:   2 weeks with pharmacist (blood pressure clinic) 3 months with Dr. Gardiner Rhyme  Other Instructions Please check your blood pressure at home daily, write it down.  Call the office or send message via Mychart with the readings in 2 weeks for Dr. Gardiner Rhyme to review.       Signed, Donato Heinz, MD  06/28/2020 6:08 PM    [place]

## 2020-06-28 ENCOUNTER — Ambulatory Visit (INDEPENDENT_AMBULATORY_CARE_PROVIDER_SITE_OTHER): Payer: Medicare Other | Admitting: Cardiology

## 2020-06-28 ENCOUNTER — Other Ambulatory Visit: Payer: Self-pay

## 2020-06-28 VITALS — BP 182/84 | HR 59 | Ht 61.0 in | Wt 170.4 lb

## 2020-06-28 DIAGNOSIS — I480 Paroxysmal atrial fibrillation: Secondary | ICD-10-CM | POA: Diagnosis not present

## 2020-06-28 DIAGNOSIS — I10 Essential (primary) hypertension: Secondary | ICD-10-CM | POA: Diagnosis not present

## 2020-06-28 DIAGNOSIS — R0683 Snoring: Secondary | ICD-10-CM

## 2020-06-28 MED ORDER — AMLODIPINE BESYLATE 5 MG PO TABS
5.0000 mg | ORAL_TABLET | Freq: Every day | ORAL | 3 refills | Status: DC
Start: 2020-06-28 — End: 2020-08-01

## 2020-06-28 NOTE — Patient Instructions (Signed)
Medication Instructions:  STOP hydralazine START amlodipine 5 mg daily   *If you need a refill on your cardiac medications before your next appointment, please call your pharmacy*  Follow-Up: At [facility], you and your health needs are our priority.  As part of our continuing mission to provide you with exceptional heart care, we have created designated Provider Care Teams.  These Care Teams include your primary Cardiologist (physician) and Advanced Practice Providers (APPs -  Physician Assistants and Nurse Practitioners) who all work together to provide you with the care you need, when you need it.  We recommend signing up for the patient portal called "MyChart".  Sign up information is provided on this After Visit Summary.  MyChart is used to connect with patients for Virtual Visits (Telemedicine).  Patients are able to view lab/test results, encounter notes, upcoming appointments, etc.  Non-urgent messages can be sent to your provider as well.   To learn more about what you can do with MyChart, go to [email or web address].    Your next appointment:   2 weeks with pharmacist (blood pressure clinic) 3 months with Dr. Gardiner Rhyme  Other Instructions Please check your blood pressure at home daily, write it down.  Call the office or send message via Mychart with the readings in 2 weeks for Dr. Gardiner Rhyme to review.

## 2020-07-01 ENCOUNTER — Telehealth: Payer: Self-pay | Admitting: *Deleted

## 2020-07-01 NOTE — Telephone Encounter (Signed)
Both patient and daughter, Jeannene Patella notified of sleep study appointment. ( patient was on cell speaker)

## 2020-07-12 ENCOUNTER — Other Ambulatory Visit: Payer: Self-pay

## 2020-07-12 ENCOUNTER — Ambulatory Visit (INDEPENDENT_AMBULATORY_CARE_PROVIDER_SITE_OTHER): Payer: Medicare Other | Admitting: Pharmacist Clinician (PhC)/ Clinical Pharmacy Specialist

## 2020-07-12 VITALS — BP 132/88 | HR 67 | Resp 16 | Ht 61.0 in | Wt 169.2 lb

## 2020-07-12 DIAGNOSIS — I10 Essential (primary) hypertension: Secondary | ICD-10-CM

## 2020-07-12 MED ORDER — BENAZEPRIL HCL 20 MG PO TABS: 30.0000 mg | ORAL_TABLET | Freq: Every day | ORAL | 1 refills | Status: DC

## 2020-07-12 NOTE — Patient Instructions (Signed)
Return for a a follow up appointment November 11 at 9 am  Go to the lab in 2 weeks - around the week of October 18  Check your blood pressure at home daily and keep record of the readings.  Take your BP meds as follows:  Increase benazepril to 30 mg (1.5 tablets) daily   Continue with all other medications  Bring all of your meds, your BP cuff and your record of home blood pressures to your next appointment.  Exercise as youre able, try to walk approximately 30 minutes per day.  Keep salt intake to a minimum, especially watch canned and prepared boxed foods.  Eat more fresh fruits and vegetables and fewer canned items.  Avoid eating in fast food restaurants.    HOW TO TAKE YOUR BLOOD PRESSURE:  Rest 5 minutes before taking your blood pressure.   Dont smoke or drink caffeinated beverages for at least 30 minutes before.  Take your blood pressure before (not after) you eat.  Sit comfortably with your back supported and both feet on the floor (dont cross your legs).  Elevate your arm to heart level on a table or a desk.  Use the proper sized cuff. It should fit smoothly and snugly around your bare upper arm. There should be enough room to slip a fingertip under the cuff. The bottom edge of the cuff should be 1 inch above the crease of the elbow.  Ideally, take 3 measurements at one sitting and record the average.

## 2020-07-12 NOTE — Assessment & Plan Note (Signed)
Patient with HFpEF and essential hypertension, now doing better, although not to goal.  Had a logn discussion explaining issues that came up during her hospitalization, including atrial fibrillation, hypertension and diastolic heart failure.  Currently diastolic readings remain elevated.  She has done well with the switch from hydralazine to amlodipine.  Will increase her benazepril slightly today from 20 mg to 30 mg daily in hopes of decreasing diastolic pressure further.  She should continue with regular blood pressure monitoring and repeat metabolic panel in 2 weeks.  We will see her back in a month for follow up

## 2020-07-12 NOTE — Progress Notes (Signed)
07/12/2020 Kristin Davidson April 08, 1941 [id number]   HPI:  Kristin Davidson is a 79 y.o. female patient of Dr Gardiner Rhyme, with a PMH below who presents today for hypertension clinic evaluation.   She is in the office today with her daughter Kristin Davidson, who is also a patient here.  She was seen by Dr. Gardiner Rhyme 2 weeks ago in the office and found to have a blood pressure of 182/84.  At that time, in addition to benazepril, she was taking hydralazine 10 mg tid.  This was discontinued and she was started on amlodipine 5 mg daily.  She was hospitalized in August for 4 days due to colitis and chest pain.  It was determined that the chest pain was gastric in nature ad the patient was started on PPI, however was referred to cardiology due to bradycardia and HFpEF.    Today she is in the office to review her blood pressure.  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  Atrial fibrillation CHADS2-VASc score of 4 (htn, age x 2, female), started on Eliquis 5  COPD Albuterol MDI prn  CHF Diastolic dysfunction grade 2  GERD Esomeprazole - opening capsule to sprinkle on food     Blood Pressure Goal:  130/80  Current Medications: amlodipine 5 mg qd, benazepril 20 mg qd  Social Hx: quit smoking about 2 years ago, prev 1/2 ppd, started at 2; no alcohol, drinks sweet tea daily  Diet: using [place]; mostly home cooked meals; not many meats - mostly chicken (otherwise choking sensation); veggies are fresh;   Exercise: had HH PT, was given exercises - balance exercises, walk in neighborhood (5-20 minutes) few times per week  Home BP readings: 10 readings over past couple of weeks, average 149/77  Intolerances: no cardiac medication intolerances  Labs:  8/21 - Na 141,K 4.3, Glu 119, BUN 9, SCr 1.24  GFR 48  Wt Readings from Last 3 Encounters:  07/12/20 169 lb 3.2 oz (76.7 kg)  06/28/20 170 lb 6.4 oz (77.3 kg)  05/19/20 169 lb (76.7 kg)   BP Readings from Last 3 Encounters:    07/12/20 132/88  06/28/20 (!) 182/84  05/20/20 (!) 160/90   Pulse Readings from Last 3 Encounters:  07/12/20 67  06/28/20 (!) 59  05/19/20 (!) 49    Current Outpatient Medications  Medication Sig Dispense Refill  . allopurinol (ZYLOPRIM) 300 MG tablet Take 150-300 mg by mouth daily. Alternate from 1/2 and 1 tablet daily  0  . amLODipine (NORVASC) 5 MG tablet Take 1 tablet (5 mg total) by mouth daily. 30 tablet 3  . apixaban (ELIQUIS) 5 MG TABS tablet Take 1 tablet (5 mg total) by mouth 2 (two) times daily. 60 tablet 6  . esomeprazole (NEXIUM) 20 MG capsule Take 20 mg by mouth daily at 12 noon.    . polyethylene glycol powder (GLYCOLAX/MIRALAX) powder Place 1 capful in 6 oz liquid and drink. Repeat this twice over the next 2 hours. 255 g 0  . VENTOLIN HFA 108 (90 Base) MCG/ACT inhaler Inhale 2 puffs into the lungs every 4 (four) hours as needed for shortness of breath.  3  . benazepril (LOTENSIN) 20 MG tablet Take 1.5 tablets (30 mg total) by mouth daily. 135 tablet 1   No current facility-administered medications for this visit.    Allergies  Allergen Reactions  . Hydromorphone Hcl     REACTION: itching  . Naproxen Sodium Itching  . Sulfonamide Derivatives  Dizziness  . Tramadol Hcl Other (See Comments)    Bloated and constipation     Past Medical History:  Diagnosis Date  . Arthritis   . COPD (chronic obstructive pulmonary disease) ([place])   . GERD (gastroesophageal reflux disease)   . Gout   . Hypertension   . Kidney disease     Blood pressure 132/88, pulse 67, resp. rate 16, height 5\' 1"  (1.549 m), weight 169 lb 3.2 oz (76.7 kg), SpO2 95 %.  Essential hypertension Patient with HFpEF and essential hypertension, now doing better, although not to goal.  Had a logn discussion explaining issues that came up during her hospitalization, including atrial fibrillation, hypertension and diastolic heart failure.  Currently diastolic readings remain elevated.  She has done  well with the switch from hydralazine to amlodipine.  Will increase her benazepril slightly today from 20 mg to 30 mg daily in hopes of decreasing diastolic pressure further.  She should continue with regular blood pressure monitoring and repeat metabolic panel in 2 weeks.  We will see her back in a month for follow up     Tommy Medal PharmD CPP [facility] [address] [place], [place] [address] [phone]

## 2020-07-25 LAB — BASIC METABOLIC PANEL
BUN/Creatinine Ratio: 16 (ref 12–28)
BUN: 21 mg/dL (ref 8–27)
CO2: 25 mmol/L (ref 20–29)
Calcium: 9.4 mg/dL (ref 8.7–10.3)
Chloride: 103 mmol/L (ref 96–106)
Creatinine, Ser: 1.34 mg/dL — ABNORMAL HIGH (ref 0.57–1.00)
GFR calc Af Amer: 43 mL/min/{1.73_m2} — ABNORMAL LOW (ref >59)
GFR calc non Af Amer: 38 mL/min/{1.73_m2} — ABNORMAL LOW (ref >59)
Glucose: 109 mg/dL — ABNORMAL HIGH (ref 65–99)
Potassium: 4.2 mmol/L (ref 3.5–5.2)
Sodium: 141 mmol/L (ref 134–144)

## 2020-07-26 ENCOUNTER — Telehealth: Payer: Self-pay | Admitting: Cardiology

## 2020-07-26 NOTE — Telephone Encounter (Signed)
Spoke with patient and patients daughter (okay per DPR), advised of the following results   BMET  Donato Heinz, MD  07/25/2020 8:14 PM EDT     Stable labs   Patient made aware and patient verbalized understanding.

## 2020-07-26 NOTE — Telephone Encounter (Signed)
Patient's daughter returning call for lab results. °

## 2020-07-29 ENCOUNTER — Telehealth: Payer: Self-pay | Admitting: Cardiology

## 2020-07-29 NOTE — Telephone Encounter (Signed)
Pt Daughter called in and stated [facility] came to visit pt yesterday and stated pt has low pulse and needed to be seen to rule out PAD.  Pt has an appt with PCP 10/29 at 3:45. She would like to know if she needs to see the pcp for this or need to make an appt with Dr Gardiner Rhyme  Right leg 0.64 Left leg 0.79 Best number  [phone]

## 2020-07-29 NOTE — Telephone Encounter (Signed)
That is fine to start with PCP.  Bradycardia is known, has been stable.  We can see if any concerns from PCP, otherwise plan to see in December as planned

## 2020-07-31 ENCOUNTER — Other Ambulatory Visit: Payer: Self-pay | Admitting: Cardiology

## 2020-08-01 NOTE — Telephone Encounter (Signed)
This is Dr. Schumann's pt 

## 2020-08-03 NOTE — Telephone Encounter (Signed)
Pt's daughter calling to check on status of whether to see PCP or Dr. Gardiner Rhyme. Advised to start with PCP per Gardiner Rhyme note on 07/29/2020. If further advice needed please call, otherwise no callback needed.   Thank you!

## 2020-08-05 ENCOUNTER — Other Ambulatory Visit: Payer: Self-pay | Admitting: Internal Medicine

## 2020-08-05 DIAGNOSIS — I739 Peripheral vascular disease, unspecified: Secondary | ICD-10-CM

## 2020-08-09 ENCOUNTER — Encounter (HOSPITAL_BASED_OUTPATIENT_CLINIC_OR_DEPARTMENT_OTHER): Payer: Medicare Other | Admitting: Cardiovascular Disease

## 2020-08-16 ENCOUNTER — Other Ambulatory Visit: Payer: Medicare Other

## 2020-08-18 ENCOUNTER — Ambulatory Visit: Payer: Medicare Other

## 2020-09-12 ENCOUNTER — Other Ambulatory Visit: Payer: Medicare Other

## 2020-09-19 ENCOUNTER — Other Ambulatory Visit: Payer: Medicare Other

## 2020-09-21 ENCOUNTER — Ambulatory Visit: Payer: Medicare Other

## 2020-09-26 ENCOUNTER — Other Ambulatory Visit: Payer: Medicare Other

## 2020-10-02 NOTE — Progress Notes (Signed)
Cardiology Office Note:    Date:  10/04/2020   ID:  Kristin Davidson, DOB 30-Nov-1940, MRN [id number]  PCP:  [name], MD  Cardiologist:  Donato Heinz, MD  Electrophysiologist:  None   Referring MD: [name], MD   Chief Complaint  Patient presents with  . Atrial Fibrillation    History of Present Illness:    Kristin Davidson is a 79 y.o. female with a hx of hypertension, COPD, CKD, atrial fibrillation who presents for follow-up.  She was admitted to [facility] on 05/08/2020 with abdominal pain and nausea/vomiting.  She had multiple episodes of vomiting, following which she developed chest pain that lasted for 5 to 10 minutes.  In the ED, underwent CT abdomen that showed likely colitis PE.  EKG showed sinus bradycardia with PACs, RBBB, LAFB, T wave inversions in leads V3-6.  Labs notable for high-sensitivity troponin 20 >29 > 24 > 22 > 23 > 16.  Echocardiogram showed EF 60 to 65%, mild LVH, grade 2 diastolic dysfunction, normal RV function, mild to moderate MR. For her chest pain, esophagitis was suspected as the cause and no further cardiac work-up was done.  She was noted throughout admission to have sinus bradycardia as low as 40s.  Appeared asymptomatic with this.  She was discharged on 05/10/20 with a cardiac monitor that showed 3% atrial fibrillation burden with average heart rate 130 bpm and 2318 episodes of SVT, longest lasting 53 seconds.  She was seen in A. fib clinic on 05/19/2020, started on Eliquis 5 mg twice daily given CHA2DS2-VASc score 4.  Since last clinic visit, she reports that she has been doing okay.  Reports she has been having abdominal and chest pain this morning.  Reports she gets this when she is hungry, usually resolves when she eats.  She has not had any food this morning.  She denies any other chest pain or shortness of breath.  She has not been exercising, most exertion she does is walking around her home.  She denies any lightheadedness, syncope, or  palpitations.  She brought her BP log with her, has been 160s to 170s over 80s.     Past Medical History:  Diagnosis Date  . Arthritis   . COPD (chronic obstructive pulmonary disease) ([place])   . GERD (gastroesophageal reflux disease)   . Gout   . Hypertension   . Kidney disease     Past Surgical History:  Procedure Laterality Date  . ABDOMINAL HYSTERECTOMY    . ABDOMINAL SURGERY    . BREAST EXCISIONAL BIOPSY Right >10+ yrs ago   benign  . COLONOSCOPY    . KNEE SURGERY      Current Medications: Current Meds  Medication Sig  . allopurinol (ZYLOPRIM) 300 MG tablet Take 150-300 mg by mouth daily. Alternate from 1/2 and 1 tablet daily  . apixaban (ELIQUIS) 5 MG TABS tablet Take 1 tablet (5 mg total) by mouth 2 (two) times daily.  . benazepril (LOTENSIN) 20 MG tablet Take 1.5 tablets (30 mg total) by mouth daily.  Marland Kitchen esomeprazole (NEXIUM) 20 MG capsule Take 20 mg by mouth daily at 12 noon.  . polyethylene glycol powder (GLYCOLAX/MIRALAX) powder Place 1 capful in 6 oz liquid and drink. Repeat this twice over the next 2 hours.  . VENTOLIN HFA 108 (90 Base) MCG/ACT inhaler Inhale 2 puffs into the lungs every 4 (four) hours as needed for shortness of breath.  . [DISCONTINUED] amLODipine (NORVASC) 5 MG tablet TAKE 1 TABLET(5 MG)  BY MOUTH DAILY     Allergies:   Alendronate sodium, Hydromorphone hcl, Naproxen sodium, Sulfonamide derivatives, and Tramadol hcl   Social History   Socioeconomic History  . Marital status: Widowed    Spouse name: Not on file  . Number of children: Not on file  . Years of education: Not on file  . Highest education level: Not on file  Occupational History  . Not on file  Tobacco Use  . Smoking status: Former Smoker    Types: Cigarettes    Quit date: 06/08/2018    Years since quitting: 2.3  . Smokeless tobacco: Never Used  Vaping Use  . Vaping Use: Never used  Substance and Sexual Activity  . Alcohol use: No  . Drug use: No  . Sexual activity: Not  on file  Other Topics Concern  . Not on file  Social History Narrative  . Not on file   Social Determinants of Health   Financial Resource Strain: Not on file  Food Insecurity: Not on file  Transportation Needs: Not on file  Physical Activity: Not on file  Stress: Not on file  Social Connections: Not on file     Family History: The patient's family history includes Bone cancer in her mother; Heart attack (age of onset: 5) in her father. There is no history of Colon cancer, Esophageal cancer, Rectal cancer, or Stomach cancer.  ROS:   Please see the history of present illness.     All other systems reviewed and are negative.  EKGs/Labs/Other Studies Reviewed:    The following studies were reviewed today:   EKG:  EKG is ordered today.  The ekg ordered today demonstrates sinus rhythm with sinus arrhythmia, rate 58, RBBB, LAFB  Recent Labs: 05/08/2020: Magnesium 2.1 05/09/2020: ALT 24; Hemoglobin 13.1; Platelets 143; TSH 1.316 07/25/2020: BUN 21; Creatinine, Ser 1.34; Potassium 4.2; Sodium 141  Recent Lipid Panel    Component Value Date/Time   CHOL 125 05/08/2020 0805   TRIG 90 05/08/2020 0805   HDL 38 (L) 05/08/2020 0805   CHOLHDL 3.3 05/08/2020 0805   VLDL 18 05/08/2020 0805   LDLCALC 69 05/08/2020 0805    Physical Exam:    VS:  BP 140/66   Pulse 70   Ht 5\' 1"  (1.549 m)   Wt 173 lb 3.2 oz (78.6 kg)   SpO2 95%   BMI 32.73 kg/m     Wt Readings from Last 3 Encounters:  10/04/20 173 lb 3.2 oz (78.6 kg)  07/12/20 169 lb 3.2 oz (76.7 kg)  06/28/20 170 lb 6.4 oz (77.3 kg)     GEN:  in no acute distress HEENT: Normal NECK: No JVD; No carotid bruits CARDIAC: RRR, no murmurs, rubs, gallops RESPIRATORY:  Clear to auscultation without rales, wheezing or rhonchi  ABDOMEN: Soft, non-tender, non-distended MUSCULOSKELETAL:  No edema; No deformity  SKIN: Warm and dry NEUROLOGIC:  Alert and oriented x 3 PSYCHIATRIC:  Normal affect   ASSESSMENT:    1. Chest pain of  uncertain etiology   2. PAF (paroxysmal atrial fibrillation) ([place])   3. Essential hypertension   4. Snoring    PLAN:    Atrial fibrillation: CHA2DS2-VASc score 4 (age x2, hypertension, female).  3% AF burden on recent monitor, average heart rate 130.  Not on AV nodal blocker given baseline bradycardia.  Echocardiogram 05/09/2020 showed normal LVEF, grade 2 diastolic dysfunction, normal RV function, mild to moderate MR. -Continue Eliquis 5 mg twice daily -Sleep study ordered  Chest pain:  Atypical in description.  Will check Lexiscan Myoview to rule out ischemia.  If unremarkable, suspect likely GI etiology  Hypertension: On Amlodipine 5 mg daily and benazepril 30 mg daily.  BP remains elevated, will increase amlodipine to 10 mg daily.  Asked patient to check BP twice daily for next 2 weeks and call with results.  Snoring: Sleep study ordered as above   RTC in 3 months   Shared Decision Making/Informed Consent The risks [chest pain, shortness of breath, cardiac arrhythmias, dizziness, blood pressure fluctuations, myocardial infarction, stroke/transient ischemic attack, nausea, vomiting, allergic reaction, radiation exposure, metallic taste sensation and life-threatening complications (estimated to be 1 in 10,000)], benefits (risk stratification, diagnosing coronary artery disease, treatment guidance) and alternatives of a nuclear stress test were discussed in detail with Kristin Davidson and she agrees to proceed.    Medication Adjustments/Labs and Tests Ordered: Current medicines are reviewed at length with the patient today.  Concerns regarding medicines are outlined above.  Orders Placed This Encounter  Procedures  . MYOCARDIAL PERFUSION IMAGING   Meds ordered this encounter  Medications  . amLODipine (NORVASC) 10 MG tablet    Sig: Take 1 tablet (10 mg total) by mouth daily.    Dispense:  90 tablet    Refill:  3    Dose increase    Patient Instructions  Medication Instructions:   INCREASE amlodipine 10 mg daily  Please check your blood pressure at home daily, write it down.  Call the office or send message via Mychart with the readings in 2 weeks for Dr. Bjorn PippinSchumann to review.   *If you need a refill on your cardiac medications before your next appointment, please call your pharmacy  Testing/Procedures: Your physician has requested that you have a lexiscan myoview. For further information please visit [email or web address]. Please follow instruction sheet, as given. This will take place at [address]  How to prepare for your Myocardial Perfusion Test:  Do not eat or drink 3 hours prior to your test, except you may have water.  Do not consume products containing caffeine (regular or decaffeinated) 12 hours prior to your test. (ex: coffee, chocolate, sodas, tea).  Do bring a list of your current medications with you.  If not listed below, you may take your medications as normal.  Do wear comfortable clothes (no dresses or overalls) and walking shoes, tennis shoes preferred (No heels or open toe shoes are allowed).  Do NOT wear cologne, perfume, aftershave, or lotions (deodorant is allowed).  The test will take approximately 3 to 4 hours to complete  If these instructions are not followed, your test will have to be rescheduled.  Follow-Up: At [facility], you and your health needs are our priority.  As part of our continuing mission to provide you with exceptional heart care, we have created designated Provider Care Teams.  These Care Teams include your primary Cardiologist (physician) and Advanced Practice Providers (APPs -  Physician Assistants and Nurse Practitioners) who all work together to provide you with the care you need, when you need it.  We recommend signing up for the patient portal called "MyChart".  Sign up information is provided on this After Visit Summary.  MyChart is used to connect with patients for Virtual Visits  (Telemedicine).  Patients are able to view lab/test results, encounter notes, upcoming appointments, etc.  Non-urgent messages can be sent to your provider as well.   To learn more about what you can do with MyChart, go to [email or web address].  Your next appointment:   3 month(s)  The format for your next appointment:   In Person  Provider:   Oswaldo Milian, MD       Signed, Donato Heinz, MD  10/04/2020 11:59 PM    [place]

## 2020-10-04 ENCOUNTER — Other Ambulatory Visit: Payer: Self-pay

## 2020-10-04 ENCOUNTER — Encounter: Payer: Self-pay | Admitting: Cardiology

## 2020-10-04 ENCOUNTER — Ambulatory Visit (INDEPENDENT_AMBULATORY_CARE_PROVIDER_SITE_OTHER): Payer: Medicare Other | Admitting: Cardiology

## 2020-10-04 VITALS — BP 140/66 | HR 70 | Ht 61.0 in | Wt 173.2 lb

## 2020-10-04 DIAGNOSIS — I480 Paroxysmal atrial fibrillation: Secondary | ICD-10-CM

## 2020-10-04 DIAGNOSIS — R079 Chest pain, unspecified: Secondary | ICD-10-CM | POA: Diagnosis not present

## 2020-10-04 DIAGNOSIS — I10 Essential (primary) hypertension: Secondary | ICD-10-CM

## 2020-10-04 DIAGNOSIS — R0683 Snoring: Secondary | ICD-10-CM

## 2020-10-04 MED ORDER — AMLODIPINE BESYLATE 10 MG PO TABS: 10.0000 mg | ORAL_TABLET | Freq: Every day | ORAL | 3 refills | Status: DC

## 2020-10-04 NOTE — Patient Instructions (Signed)
Medication Instructions:  INCREASE amlodipine 10 mg daily  Please check your blood pressure at home daily, write it down.  Call the office or send message via Mychart with the readings in 2 weeks for Dr. Bjorn Pippin to review.   *If you need a refill on your cardiac medications before your next appointment, please call your pharmacy  Testing/Procedures: Your physician has requested that you have a lexiscan myoview. For further information please visit [email or web address] Please follow instruction sheet, as given. This will take place at [address], [address]  How to prepare for your Myocardial Perfusion Test:  Do not eat or drink 3 hours prior to your test, except you may have water.  Do not consume products containing caffeine (regular or decaffeinated) 12 hours prior to your test. (ex: coffee, chocolate, sodas, tea).  Do bring a list of your current medications with you.  If not listed below, you may take your medications as normal.  Do wear comfortable clothes (no dresses or overalls) and walking shoes, tennis shoes preferred (No heels or open toe shoes are allowed).  Do NOT wear cologne, perfume, aftershave, or lotions (deodorant is allowed).  The test will take approximately 3 to 4 hours to complete  If these instructions are not followed, your test will have to be rescheduled.  Follow-Up: At [facility], you and your health needs are our priority.  As part of our continuing mission to provide you with exceptional heart care, we have created designated Provider Care Teams.  These Care Teams include your primary Cardiologist (physician) and Advanced Practice Providers (APPs -  Physician Assistants and Nurse Practitioners) who all work together to provide you with the care you need, when you need it.  We recommend signing up for the patient portal called "MyChart".  Sign up information is provided on this After Visit Summary.  MyChart is used to connect with patients for  Virtual Visits (Telemedicine).  Patients are able to view lab/test results, encounter notes, upcoming appointments, etc.  Non-urgent messages can be sent to your provider as well.   To learn more about what you can do with MyChart, go to [email or web address].    Your next appointment:   3 month(s)  The format for your next appointment:   In Person  Provider:   Epifanio Lesches, MD

## 2020-10-05 ENCOUNTER — Telehealth: Payer: Self-pay | Admitting: Cardiology

## 2020-10-05 NOTE — Telephone Encounter (Signed)
Chart review: Dr. Nehemiah Settle ordered exercise LEA scheduled for 10/13/20.     Spoke to daughter and patient-advised ok to keep lexi and test ordered by PCP as scheduled.

## 2020-10-05 NOTE — Addendum Note (Signed)
Addended by: Dorris Fetch on: 10/05/2020 02:08 PM   Modules accepted: Orders

## 2020-10-05 NOTE — Telephone Encounter (Signed)
Called and spoke to patient and daughter-  They stated they were seen yesterday and a Lexi was scheduled, but when she got home she remembered that her PCP Dr.Polite had ordered an Exercise stress for her and she didn't feel she needed both. She wanted Dr.Schumann's opinion on which one she should do.  Advised I would route to MD to advise and I could call her back.  Thanks!

## 2020-10-05 NOTE — Telephone Encounter (Signed)
New Message  Pts daughter is calling and has questions about the stress test that is scheduled on 01/11  She says the is scheduled for an Exercise tet with another office on 01/06.  She is wondering what the difference is on these tests and if its necessary for her to have them both   Please call

## 2020-10-13 ENCOUNTER — Ambulatory Visit
Admission: RE | Admit: 2020-10-13 | Discharge: 2020-10-13 | Disposition: A | Discharge status: Home / Self Care | Payer: Medicare Other | Source: Ambulatory Visit | Attending: Internal Medicine | Admitting: Internal Medicine

## 2020-10-13 DIAGNOSIS — I739 Peripheral vascular disease, unspecified: Secondary | ICD-10-CM

## 2020-10-14 ENCOUNTER — Telehealth (HOSPITAL_COMMUNITY): Payer: Self-pay | Admitting: *Deleted

## 2020-10-14 NOTE — Telephone Encounter (Signed)
Close encounter 

## 2020-10-18 ENCOUNTER — Inpatient Hospital Stay (HOSPITAL_COMMUNITY): Admission: RE | Admit: 2020-10-18 | Payer: Medicare Other | Source: Ambulatory Visit

## 2020-10-28 ENCOUNTER — Telehealth (HOSPITAL_COMMUNITY): Payer: Self-pay | Admitting: *Deleted

## 2020-10-28 DIAGNOSIS — I10 Essential (primary) hypertension: Secondary | ICD-10-CM | POA: Diagnosis not present

## 2020-10-28 DIAGNOSIS — N1831 Chronic kidney disease, stage 3a: Secondary | ICD-10-CM | POA: Diagnosis not present

## 2020-10-28 DIAGNOSIS — E1169 Type 2 diabetes mellitus with other specified complication: Secondary | ICD-10-CM | POA: Diagnosis not present

## 2020-10-28 DIAGNOSIS — I480 Paroxysmal atrial fibrillation: Secondary | ICD-10-CM | POA: Diagnosis not present

## 2020-10-28 DIAGNOSIS — E7800 Pure hypercholesterolemia, unspecified: Secondary | ICD-10-CM | POA: Diagnosis not present

## 2020-10-28 DIAGNOSIS — J449 Chronic obstructive pulmonary disease, unspecified: Secondary | ICD-10-CM | POA: Diagnosis not present

## 2020-10-28 DIAGNOSIS — M810 Age-related osteoporosis without current pathological fracture: Secondary | ICD-10-CM | POA: Diagnosis not present

## 2020-10-28 NOTE — Telephone Encounter (Signed)
Close encounter 

## 2020-11-01 ENCOUNTER — Ambulatory Visit (HOSPITAL_COMMUNITY)
Admission: RE | Admit: 2020-11-01 | Discharge: 2020-11-01 | Disposition: A | Discharge status: Home / Self Care | Payer: Medicare Other | Source: Ambulatory Visit | Attending: Cardiovascular Disease | Admitting: Cardiovascular Disease

## 2020-11-01 ENCOUNTER — Other Ambulatory Visit: Payer: Self-pay

## 2020-11-01 DIAGNOSIS — R079 Chest pain, unspecified: Secondary | ICD-10-CM | POA: Diagnosis not present

## 2020-11-01 LAB — MYOCARDIAL PERFUSION IMAGING
LV dias vol: 99 mL (ref 46–106)
LV sys vol: 36 mL
Peak HR: 76 {beats}/min
Rest HR: 54 {beats}/min
SDS: 0
SRS: 3
SSS: 3
TID: 0.89

## 2020-11-01 MED ORDER — AMINOPHYLLINE 25 MG/ML IV SOLN
75.0000 mg | Freq: Once | INTRAVENOUS | Status: AC
Start: 2020-11-01 — End: 2020-11-01
  Administered 2020-11-01: 75 mg via INTRAVENOUS

## 2020-11-01 MED ORDER — REGADENOSON 0.4 MG/5ML IV SOLN
0.4000 mg | Freq: Once | INTRAVENOUS | Status: AC
Start: 2020-11-01 — End: 2020-11-01
  Administered 2020-11-01: 0.4 mg via INTRAVENOUS

## 2020-11-01 MED ORDER — TECHNETIUM TC 99M TETROFOSMIN IV KIT
31.2000 | PACK | Freq: Once | INTRAVENOUS | Status: AC | PRN
  Administered 2020-11-01: 31.2 via INTRAVENOUS
  Filled 2020-11-01: qty 32

## 2020-11-01 MED ORDER — TECHNETIUM TC 99M TETROFOSMIN IV KIT
10.6000 | PACK | Freq: Once | INTRAVENOUS | Status: AC | PRN
  Administered 2020-11-01: 10.6 via INTRAVENOUS
  Filled 2020-11-01: qty 11

## 2020-11-09 ENCOUNTER — Encounter: Payer: Self-pay | Admitting: Podiatry

## 2020-11-09 ENCOUNTER — Other Ambulatory Visit: Payer: Self-pay

## 2020-11-09 ENCOUNTER — Ambulatory Visit: Payer: Medicare Other | Admitting: Podiatry

## 2020-11-09 VITALS — BP 142/73 | HR 57 | Temp 99.2°F

## 2020-11-09 DIAGNOSIS — M79674 Pain in right toe(s): Secondary | ICD-10-CM

## 2020-11-09 DIAGNOSIS — M79675 Pain in left toe(s): Secondary | ICD-10-CM | POA: Diagnosis not present

## 2020-11-09 DIAGNOSIS — L600 Ingrowing nail: Secondary | ICD-10-CM

## 2020-11-09 DIAGNOSIS — I5189 Other ill-defined heart diseases: Secondary | ICD-10-CM

## 2020-11-09 DIAGNOSIS — R928 Other abnormal and inconclusive findings on diagnostic imaging of breast: Secondary | ICD-10-CM | POA: Insufficient documentation

## 2020-11-09 DIAGNOSIS — M2011 Hallux valgus (acquired), right foot: Secondary | ICD-10-CM

## 2020-11-09 DIAGNOSIS — J449 Chronic obstructive pulmonary disease, unspecified: Secondary | ICD-10-CM | POA: Insufficient documentation

## 2020-11-09 DIAGNOSIS — M109 Gout, unspecified: Secondary | ICD-10-CM | POA: Insufficient documentation

## 2020-11-09 DIAGNOSIS — N1831 Chronic kidney disease, stage 3a: Secondary | ICD-10-CM | POA: Insufficient documentation

## 2020-11-09 DIAGNOSIS — M2012 Hallux valgus (acquired), left foot: Secondary | ICD-10-CM

## 2020-11-09 DIAGNOSIS — B351 Tinea unguium: Secondary | ICD-10-CM

## 2020-11-09 DIAGNOSIS — R631 Polydipsia: Secondary | ICD-10-CM

## 2020-11-09 HISTORY — DX: Polydipsia: R63.1

## 2020-11-09 HISTORY — DX: Chronic obstructive pulmonary disease, unspecified: J44.9

## 2020-11-09 HISTORY — DX: Chronic kidney disease, stage 3a: N18.31

## 2020-11-09 HISTORY — DX: Other abnormal and inconclusive findings on diagnostic imaging of breast: R92.8

## 2020-11-09 HISTORY — DX: Other ill-defined heart diseases: I51.89

## 2020-11-09 NOTE — Patient Instructions (Addendum)
EPSOM SALT FOOT SOAK INSTRUCTIONS  *IF YOU HAVE BEEN PRESCRIBED ANTIBIOTICS, TAKE AS INSTRUCTED UNTIL ALL ARE GONE*  Shopping List:  A. Plain epsom salt (not scented) B. Neosporin Cream/Ointment or Bacitracin Cream/Ointment (or prescribed antiobiotic drops/cream/ointment) C. 1-inch fabric band-aids  1.  Place 1/4 cup of epsom salts in 2 quarts of warm tap water. IF YOU ARE DIABETIC, OR HAVE NEUROPATHY, CHECK THE TEMPERATURE OF THE WATER WITH YOUR ELBOW.  2.  Submerge your foot/feet in the solution and soak for 10-15 minutes.      3.  Next, remove your foot/feet from solution, blot dry the affected area.    4.  Apply light amount of antibiotic cream/ointment and cover with fabric band-aid .  5.  This soak should be done once a day for 14 days.   6.  Monitor for any signs/symptoms of infection such as redness, swelling, odor, drainage, increased pain, or non-healing of digit.   7.  Please do not hesitate to call the office and speak to a Nurse or Doctor if you have questions.   8.  If you experience fever, chills, nightsweats, nausea or vomiting with worsening of digit, please go to the emergency room.    Diabetes Mellitus and Foot Care Foot care is an important part of your health, especially when you have diabetes. Diabetes may cause you to have problems because of poor blood flow (circulation) to your feet and legs, which can cause your skin to:  Become thinner and drier.  Break more easily.  Heal more slowly.  Peel and crack. You may also have nerve damage (neuropathy) in your legs and feet, causing decreased feeling in them. This means that you may not notice minor injuries to your feet that could lead to more serious problems. Noticing and addressing any potential problems early is the best way to prevent future foot problems. How to care for your feet Foot hygiene  Wash your feet daily with warm water and mild soap. Do not use hot water. Then, pat your feet and the areas  between your toes until they are completely dry. Do not soak your feet as this can dry your skin.  Trim your toenails straight across. Do not dig under them or around the cuticle. File the edges of your nails with an emery board or nail file.  Apply a moisturizing lotion or petroleum jelly to the skin on your feet and to dry, brittle toenails. Use lotion that does not contain alcohol and is unscented. Do not apply lotion between your toes.   Shoes and socks  Wear clean socks or stockings every day. Make sure they are not too tight. Do not wear knee-high stockings since they may decrease blood flow to your legs.  Wear shoes that fit properly and have enough cushioning. Always look in your shoes before you put them on to be sure there are no objects inside.  To break in new shoes, wear them for just a few hours a day. This prevents injuries on your feet. Wounds, scrapes, corns, and calluses  Check your feet daily for blisters, cuts, bruises, sores, and redness. If you cannot see the bottom of your feet, use a mirror or ask someone for help.  Do not cut corns or calluses or try to remove them with medicine.  If you find a minor scrape, cut, or break in the skin on your feet, keep it and the skin around it clean and dry. You may clean these areas with mild  soap and water. Do not clean the area with peroxide, alcohol, or iodine.  If you have a wound, scrape, corn, or callus on your foot, look at it several times a day to make sure it is healing and not infected. Check for: ? Redness, swelling, or pain. ? Fluid or blood. ? Warmth. ? Pus or a bad smell.   General tips  Do not cross your legs. This may decrease blood flow to your feet.  Do not use heating pads or hot water bottles on your feet. They may burn your skin. If you have lost feeling in your feet or legs, you may not know this is happening until it is too late.  Protect your feet from hot and cold by wearing shoes, such as at the  beach or on hot pavement.  Schedule a complete foot exam at least once a year (annually) or more often if you have foot problems. Report any cuts, sores, or bruises to your health care provider immediately. Where to find more information  American Diabetes Association: www.diabetes.org  Association of Diabetes Care & Education Specialists: www.diabeteseducator.org Contact a health care provider if:  You have a medical condition that increases your risk of infection and you have any cuts, sores, or bruises on your feet.  You have an injury that is not healing.  You have redness on your legs or feet.  You feel burning or tingling in your legs or feet.  You have pain or cramps in your legs and feet.  Your legs or feet are numb.  Your feet always feel cold.  You have pain around any toenails. Get help right away if:  You have a wound, scrape, corn, or callus on your foot and: ? You have pain, swelling, or redness that gets worse. ? You have fluid or blood coming from the wound, scrape, corn, or callus. ? Your wound, scrape, corn, or callus feels warm to the touch. ? You have pus or a bad smell coming from the wound, scrape, corn, or callus. ? You have a fever. ? You have a red line going up your leg. Summary  Check your feet every day for blisters, cuts, bruises, sores, and redness.  Apply a moisturizing lotion or petroleum jelly to the skin on your feet and to dry, brittle toenails.  Wear shoes that fit properly and have enough cushioning.  If you have foot problems, report any cuts, sores, or bruises to your health care provider immediately.  Schedule a complete foot exam at least once a year (annually) or more often if you have foot problems. This information is not intended to replace advice given to you by your health care provider. Make sure you discuss any questions you have with your health care provider. Document Revised: 04/14/2020 Document Reviewed:  04/14/2020 Elsevier Patient Education  [place].

## 2020-11-15 NOTE — Progress Notes (Signed)
Subjective: Kristin Davidson presents today referred by [name], MD for complaint of painful thick toenails that are difficult to trim. Pain interferes with ambulation. Aggravating factors include wearing enclosed shoe gear.She and her daughter have attempted to cut them. Right great toe has remained tender.   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 She is also on blood thinner, Eliquis.   PCP is Dr. [name] and last visit was August, 2021.   Past Medical History:  Diagnosis Date  . Arthritis   . COPD (chronic obstructive pulmonary disease) ([place])   . GERD (gastroesophageal reflux disease)   . Gout   . Hypertension   . Kidney disease      Patient Active Problem List   Diagnosis Date Noted  . Abnormal mammogram 11/09/2020  . Chronic kidney disease, stage 3a ([place]) 11/09/2020  . Chronic obstructive pulmonary disease, unspecified ([place]) 11/09/2020  . Diastolic dysfunction 57/84/6962  . Excessive thirst 11/09/2020  . Gout 11/09/2020  . Paroxysmal atrial fibrillation ([place]) 05/19/2020  . Secondary hypercoagulable state ([place]) 05/19/2020  . Atypical chest pain 05/08/2020  . Colitis   . Nausea and vomiting in adult   . 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 Essential hypertension 08/06/2007  . ALLERGIC RHINITIS 08/06/2007  . GERD 08/06/2007  . BREAST CYST 08/06/2007  . 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     Past Surgical History:  Procedure Laterality Date  . ABDOMINAL HYSTERECTOMY    . ABDOMINAL SURGERY    . BREAST EXCISIONAL BIOPSY Right >10+ yrs ago   benign  . COLONOSCOPY    . KNEE SURGERY       Current Outpatient Medications on File Prior to Visit  Medication Sig Dispense Refill  . allopurinol (ZYLOPRIM) 300 MG tablet Take 150-300 mg by mouth daily. Alternate from 1/2 and 1 tablet daily  0  . amLODipine (NORVASC) 10 MG tablet Take 1 tablet (10 mg total) by mouth daily. 90 tablet 3  . apixaban (ELIQUIS) 5 MG TABS tablet Take 1 tablet (5 mg total) by mouth 2  (two) times daily. 60 tablet 6  . benazepril (LOTENSIN) 20 MG tablet Take 1.5 tablets (30 mg total) by mouth daily. 135 tablet 1  . esomeprazole (NEXIUM) 20 MG capsule Take 20 mg by mouth daily at 12 noon.    . polyethylene glycol powder (GLYCOLAX/MIRALAX) powder Place 1 capful in 6 oz liquid and drink. Repeat this twice over the next 2 hours. 255 g 0  . VENTOLIN HFA 108 (90 Base) MCG/ACT inhaler Inhale 2 puffs into the lungs every 4 (four) hours as needed for shortness of breath.  3   No current facility-administered medications on file prior to visit.     Allergies  Allergen Reactions  . Alendronate Sodium Other (See Comments)  . Hydromorphone Hcl     REACTION: itching  . Naproxen Sodium Itching  . Sulfonamide Derivatives     Dizziness  . Tramadol Hcl Other (See Comments)    Bloated and constipation      Social History   Occupational History  . Not on file  Tobacco Use  . Smoking status: Former Smoker    Types: Cigarettes    Quit date: 06/08/2018    Years since quitting: 2.4  . Smokeless tobacco: Never Used  Vaping Use  . Vaping Use: Never used  Substance and Sexual Activity  . Alcohol use: No  . Drug use: No  . Sexual activity: Not on file     Family History  Problem Relation Age  of Onset  . Bone cancer Mother   . Heart attack Father 60  . Colon cancer Neg Hx   . Esophageal cancer Neg Hx   . Rectal cancer Neg Hx   . Stomach cancer Neg Hx      Immunization History  Administered Date(s) Administered  . Influenza Whole 07/30/2006  . Influenza, High Dose Seasonal PF 06/30/2018  . Pneumococcal Polysaccharide-23 07/30/2006, 06/30/2018  . Td 07/30/2006     Objective: Kristin Davidson is a pleasant 80 y.o. female WD, WN in NAD. AAO x 3.  Vitals:   11/09/20 0856  BP: (!) 142/73  Pulse: (!) 57  Temp: 99.2 F (37.3 C)    Vascular Examination:  Capillary refill time to digits immediate b/l. Palpable DP pulse(s) b/l lower extremities Faintly palpable PT  pulse(s) b/l lower extremities. Pedal hair absent. Lower extremity skin temperature gradient within normal limits. No pain with calf compression b/l.  Dermatological Examination: Pedal skin with normal turgor, texture and tone bilaterally. No open wounds bilaterally. No interdigital macerations bilaterally. Toenails 2-5 bilaterally and L hallux elongated, discolored, dystrophic, thickened, and crumbly with subungual debris and tenderness to dorsal palpation. Incurvated nailplate bilateral border(s) R hallux.  Nail border hypertrophy present. There is tenderness to palpation. Sign(s) of infection: no clinical signs of infection noted on examination today..  Musculoskeletal: Normal muscle strength 5/5 to all lower extremity muscle groups bilaterally. No pain crepitus or joint limitation noted with ROM b/l. Hallux valgus with bunion deformity noted b/l lower extremities.  Neurological: Protective sensation intact 5/5 intact bilaterally with 10g monofilament b/l. Vibratory sensation intact b/l.  Assessment: 1. Pain due to onychomycosis of toenails of both feet   2. Ingrown toenail without infection   3. Pain in toe of right foot   4. Hallux valgus, acquired, bilateral     Plan: -Examined patient. -Patient to continue soft, supportive shoe gear daily. -Discussed diagnosis of with treatment options. Patient/POA agreed to have temporary /total nail avulsion of right hallux.  Consent form signed and in chart. Prepped great toe with betadine solution. Local injection of  3 cc's 50/50 mix of 1% Lidocaine plain and 0.5% marcaine plain administered via hallux block. Offending nailplate right hallux freed utilizing elevator. Offending nail plate removed with hemostat. Border cleansed with alcohol. Light bleeding controlled with Lumicain Hemostatic Solution. Light dressng applied. Discused post-procedure instructions of epsom salt warm water soaks and dispensed written handout. Patient/POA related  understanding of post-procedure instructions. Call office if there is increased redness, swelling, drainage, odor or pain. -Toenails 2-5 bilaterally and L hallux debrided in length and girth without iatrogenic bleeding with sterile nail nipper and dremel.  -Patient to report any pedal injuries to medical professional immediately. -Patient/POA to call should there be question/concern in the interim.  Return in about 2 weeks (around 11/23/2020) for right great toe nail check.  Marzetta Board, DPM

## 2020-11-18 DIAGNOSIS — R252 Cramp and spasm: Secondary | ICD-10-CM | POA: Diagnosis not present

## 2020-11-25 ENCOUNTER — Ambulatory Visit (INDEPENDENT_AMBULATORY_CARE_PROVIDER_SITE_OTHER): Payer: Medicare Other | Admitting: Podiatry

## 2020-11-25 ENCOUNTER — Encounter: Payer: Self-pay | Admitting: Podiatry

## 2020-11-25 ENCOUNTER — Other Ambulatory Visit: Payer: Self-pay

## 2020-11-25 DIAGNOSIS — Z9889 Other specified postprocedural states: Secondary | ICD-10-CM | POA: Diagnosis not present

## 2020-11-25 DIAGNOSIS — L600 Ingrowing nail: Secondary | ICD-10-CM

## 2020-12-01 NOTE — Progress Notes (Signed)
Subjective:  Kristin Davidson presents today s/p temporary total nail avulsion R hallux. Her daughter is present during today's visit. Patient states she has been following post procedure instructions.   Objective: There were no vitals filed for this visit.  80 y.o. female in NAD.  AAO X 3.  Capillary refill time to digits immediate b/l. Palpable DP pulse(s) b/l lower extremities Faintly palpable PT pulse(s) b/l lower extremities. Pedal hair absent. Lower extremity skin temperature gradient within normal limits. No pain with calf compression b/l.  Pedal skin with normal turgor, texture and tone bilaterally. No open wounds bilaterally. No interdigital macerations bilaterally. Toenails left hallux and 2-5 b/l well maintained with adequate length. No erythema, no edema, no drainage, no fluctuance. Procedure site of R hallux noted to be completely healed with no erythema, no edema, no drainage, no purulence.   Normal muscle strength 5/5 to all lower extremity muscle groups bilaterally. No pain crepitus or joint limitation noted with ROM b/l. Hallux valgus with bunion deformity noted b/l lower extremities.  Protective sensation intact 5/5 intact bilaterally with 10g monofilament b/l. Vibratory sensation intact b/l.  Assessment: s/p temporary total nail avulsion R hallux, healed  Plan: -Examined patient. -Discontinue epsom salt soaks. -Patient to continue soft, supportive shoe gear daily. -Patient to report any pedal injuries to medical professional immediately. -Patient/POA to call should there be question/concern in the interim.  Return in about 3 months (around 02/22/2021).  Marzetta Board, DPM

## 2020-12-09 DIAGNOSIS — M545 Low back pain, unspecified: Secondary | ICD-10-CM | POA: Diagnosis not present

## 2020-12-14 ENCOUNTER — Ambulatory Visit: Payer: Medicare Other | Admitting: Orthopaedic Surgery

## 2020-12-26 ENCOUNTER — Other Ambulatory Visit: Payer: Self-pay

## 2020-12-26 MED ORDER — APIXABAN 5 MG PO TABS: 5.0000 mg | ORAL_TABLET | Freq: Two times a day (BID) | ORAL | 1 refills | Status: DC

## 2020-12-28 ENCOUNTER — Other Ambulatory Visit: Payer: Self-pay

## 2020-12-28 ENCOUNTER — Ambulatory Visit: Payer: Self-pay

## 2020-12-28 ENCOUNTER — Encounter: Payer: Self-pay | Admitting: Orthopaedic Surgery

## 2020-12-28 ENCOUNTER — Ambulatory Visit (INDEPENDENT_AMBULATORY_CARE_PROVIDER_SITE_OTHER): Payer: Medicare Other | Admitting: Orthopaedic Surgery

## 2020-12-28 DIAGNOSIS — M25552 Pain in left hip: Secondary | ICD-10-CM

## 2020-12-28 DIAGNOSIS — M5441 Lumbago with sciatica, right side: Secondary | ICD-10-CM

## 2020-12-28 DIAGNOSIS — M5442 Lumbago with sciatica, left side: Secondary | ICD-10-CM

## 2020-12-28 DIAGNOSIS — M25551 Pain in right hip: Secondary | ICD-10-CM

## 2020-12-28 DIAGNOSIS — M4807 Spinal stenosis, lumbosacral region: Secondary | ICD-10-CM

## 2020-12-28 MED ORDER — METHYLPREDNISOLONE 4 MG PO TABS
ORAL_TABLET | ORAL | 0 refills | Status: DC
Start: 2020-12-28 — End: 2021-02-13

## 2020-12-28 MED ORDER — BACLOFEN 10 MG PO TABS
10.0000 mg | ORAL_TABLET | Freq: Three times a day (TID) | ORAL | 0 refills | Status: DC | PRN
Start: 2020-12-28 — End: 2021-01-04

## 2020-12-28 NOTE — Progress Notes (Signed)
Office Visit Note   Patient: Kristin Davidson           Date of Birth: Mar 05, 1941           MRN: [id number] Visit Date: 12/28/2020              Requested by: [name], MD [address] [place],  [place] [address] PCP: [name], MD   Assessment & Plan: Visit Diagnoses:  1. Bilateral low back pain with bilateral sciatica, unspecified chronicity   2. Pain in left hip   3. Pain in right hip     Plan: At this point a MRI is warranted of lumbar spine to assess the facet joints and posterior elements to determine whether or not should benefit from a specific injection it the facet joints but it depends on what level.  She may end up benefiting from a left SI joint injection as well.  For now, we will start her on baclofen as a muscle relaxant to take intermittently and a steroid taper since she is now diabetic.  All question concerns were answered and addressed.  We will see her back after the MRI of her lumbar spine.  Follow-Up Instructions: Return in about 4 weeks (around 01/25/2021).   Orders:  Orders Placed This Encounter  Procedures  . XR HIP UNILAT W OR W/O PELVIS 2-3 VIEWS RIGHT  . XR Lumbar Spine 2-3 Views  . XR HIPS BILAT W OR W/O PELVIS 2V   Meds ordered this encounter  Medications  . methylPREDNISolone (MEDROL) 4 MG tablet    Sig: Medrol dose pack. Take as instructed    Dispense:  21 tablet    Refill:  0  . baclofen (LIORESAL) 10 MG tablet    Sig: Take 1 tablet (10 mg total) by mouth 3 (three) times daily as needed for muscle spasms.    Dispense:  30 each    Refill:  0      Procedures: No procedures performed   Clinical Data: No additional findings.   Subjective: Chief Complaint  Patient presents with  . Lower Back - Pain  . Right Hip - Pain  . Left Hip - Pain  The patient comes in today for evaluation treatment of bilateral low back pain that radiates into the sciatic area of both sides.  She has been seen in our office many years  ago.  She denies any groin pain or denies any numbness and tingling in her legs.  She does have PAD according to her.  She does wake up with spasming is almost every night in her lower back.  She is 80 years old.  She is not a diabetic.  She does take Eliquis as a blood thinning medication.  I reviewed her chart and she does have a history of SI joint injection to the left side years ago at another orthopedic office.  She is requesting an injection today in her back.  She has never had an MRI of her back.  HPI  Review of Systems Today she denies any headache, chest pain, shortness of breath, fever, chills, nausea, vomiting  Objective: Vital Signs: There were no vitals taken for this visit.  Physical Exam She is alert and orient x3 in no acute distress Ortho Exam Examination of both hips show the move smoothly and fluidly with no pain at all with the left hip.  She has pain of the left SI joint but more of her pain seems to  be bilaterally at the lower lumbar spine level in the paraspinal muscles. Specialty Comments:  No specialty comments available.  Imaging: XR HIP UNILAT W OR W/O PELVIS 2-3 VIEWS RIGHT  Result Date: 12/28/2020 An AP pelvis and lateral of the right hip shows no acute findings.  The hip joint spaces are well-maintained.  There is significant left SI joint arthritic changes.  XR HIPS BILAT W OR W/O PELVIS 2V  Result Date: 12/28/2020 An AP pelvis and lateral of the right hip and left hip shows no acute findings and no significant arthritic changes.  There is left-sided SI joint arthritic changes.    PMFS History: Patient Active Problem List   Diagnosis Date Noted  . Abnormal mammogram 11/09/2020  . Chronic kidney disease, stage 3a ([place]) 11/09/2020  . Chronic obstructive pulmonary disease, unspecified ([place]) 11/09/2020  . Diastolic dysfunction 88/75/7972  . Excessive thirst 11/09/2020  . Gout 11/09/2020  . Paroxysmal atrial fibrillation ([place]) 05/19/2020  . Secondary  hypercoagulable state ([place]) 05/19/2020  . Atypical chest pain 05/08/2020  . Colitis   . Nausea and vomiting in adult   . 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 Essential hypertension 08/06/2007  . ALLERGIC RHINITIS 08/06/2007  . GERD 08/06/2007  . BREAST CYST 08/06/2007  . 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   Past Medical History:  Diagnosis Date  . Arthritis   . COPD (chronic obstructive pulmonary disease) ([place])   . GERD (gastroesophageal reflux disease)   . Gout   . Hypertension   . Kidney disease     Family History  Problem Relation Age of Onset  . Bone cancer Mother   . Heart attack Father 66  . Colon cancer Neg Hx   . Esophageal cancer Neg Hx   . Rectal cancer Neg Hx   . Stomach cancer Neg Hx     Past Surgical History:  Procedure Laterality Date  . ABDOMINAL HYSTERECTOMY    . ABDOMINAL SURGERY    . BREAST EXCISIONAL BIOPSY Right >10+ yrs ago   benign  . COLONOSCOPY    . KNEE SURGERY     Social History   Occupational History  . Not on file  Tobacco Use  . Smoking status: Former Smoker    Types: Cigarettes    Quit date: 06/08/2018    Years since quitting: 2.5  . Smokeless tobacco: Never Used  Vaping Use  . Vaping Use: Never used  Substance and Sexual Activity  . Alcohol use: No  . Drug use: No  . Sexual activity: Not on file

## 2020-12-29 ENCOUNTER — Other Ambulatory Visit: Payer: Self-pay | Admitting: Orthopaedic Surgery

## 2020-12-29 ENCOUNTER — Telehealth: Payer: Self-pay

## 2020-12-29 MED ORDER — DIAZEPAM 5 MG PO TABS: 5.0000 mg | ORAL_TABLET | Freq: Once | ORAL | 0 refills | Status: AC

## 2020-12-29 NOTE — Telephone Encounter (Signed)
Patients daughter called she stated patient needs a rx for valium so she can complete her mri 4/27 call back:[phone]

## 2020-12-29 NOTE — Telephone Encounter (Signed)
I sent in a Valium to take 30 minutes prior to her MRI.

## 2020-12-30 ENCOUNTER — Other Ambulatory Visit: Payer: Self-pay | Admitting: Orthopaedic Surgery

## 2020-12-30 ENCOUNTER — Telehealth: Payer: Self-pay | Admitting: Orthopaedic Surgery

## 2020-12-30 MED ORDER — DIAZEPAM 5 MG PO TABS: 5.0000 mg | ORAL_TABLET | Freq: Once | ORAL | 0 refills | Status: AC

## 2020-12-30 NOTE — Telephone Encounter (Signed)
Please advise 

## 2020-12-30 NOTE — Telephone Encounter (Signed)
Pt's daughter notified. Daughter wanted me to confirm it was sent to the [place]?

## 2020-12-30 NOTE — Telephone Encounter (Signed)
ok 

## 2020-12-30 NOTE — Telephone Encounter (Signed)
Pt daughter Jeannene Patella called and said that she just finished the prednisolone from her primary so she is wondering should she start the one Dr.Blackman prescribed?  cb [phone]

## 2020-12-30 NOTE — Telephone Encounter (Signed)
Called and advised.

## 2020-12-30 NOTE — Telephone Encounter (Signed)
That will be fine. 

## 2021-01-03 ENCOUNTER — Ambulatory Visit: Payer: Medicare Other | Admitting: Cardiology

## 2021-01-04 ENCOUNTER — Other Ambulatory Visit: Payer: Self-pay | Admitting: Orthopaedic Surgery

## 2021-01-06 ENCOUNTER — Ambulatory Visit: Payer: Medicare Other | Admitting: Cardiology

## 2021-01-06 ENCOUNTER — Other Ambulatory Visit: Payer: Self-pay | Admitting: Cardiology

## 2021-01-11 ENCOUNTER — Telehealth: Payer: Self-pay

## 2021-01-11 NOTE — Telephone Encounter (Signed)
fyi

## 2021-01-11 NOTE — Telephone Encounter (Signed)
Pt's daughter called and advised that her mother began with a rash on both of her legs yesterday and has now spread all over her body. She is giving her benadryl but wanted to call and make you aware as she was just in the office last month. I advised for her to call the PCP for eval that it was unlikely that this many weeks out from taking her baclofen that it would be an allergic reaction to this medication but that I would make you aware and encourage to check with PCP as it could be quite a few different things and needs follow up and if you had anything to add that I would call her back if needed.

## 2021-01-14 DIAGNOSIS — I10 Essential (primary) hypertension: Secondary | ICD-10-CM | POA: Diagnosis not present

## 2021-01-14 DIAGNOSIS — B370 Candidal stomatitis: Secondary | ICD-10-CM | POA: Diagnosis not present

## 2021-01-17 ENCOUNTER — Other Ambulatory Visit: Payer: Self-pay | Admitting: Orthopaedic Surgery

## 2021-01-17 NOTE — Telephone Encounter (Signed)
Please advise 

## 2021-01-25 ENCOUNTER — Ambulatory Visit: Payer: Medicare Other | Admitting: Orthopaedic Surgery

## 2021-01-25 ENCOUNTER — Other Ambulatory Visit: Payer: Self-pay | Admitting: Orthopaedic Surgery

## 2021-01-29 ENCOUNTER — Other Ambulatory Visit: Payer: Self-pay

## 2021-01-29 ENCOUNTER — Ambulatory Visit
Admission: RE | Admit: 2021-01-29 | Discharge: 2021-01-29 | Disposition: A | Discharge status: Home / Self Care | Payer: Medicare Other | Source: Ambulatory Visit | Attending: Orthopaedic Surgery | Admitting: Orthopaedic Surgery

## 2021-01-29 DIAGNOSIS — M4807 Spinal stenosis, lumbosacral region: Secondary | ICD-10-CM

## 2021-01-29 DIAGNOSIS — M48061 Spinal stenosis, lumbar region without neurogenic claudication: Secondary | ICD-10-CM | POA: Diagnosis not present

## 2021-01-29 DIAGNOSIS — M545 Low back pain, unspecified: Secondary | ICD-10-CM | POA: Diagnosis not present

## 2021-01-30 DIAGNOSIS — M810 Age-related osteoporosis without current pathological fracture: Secondary | ICD-10-CM | POA: Diagnosis not present

## 2021-01-30 DIAGNOSIS — E1169 Type 2 diabetes mellitus with other specified complication: Secondary | ICD-10-CM | POA: Diagnosis not present

## 2021-01-30 DIAGNOSIS — I10 Essential (primary) hypertension: Secondary | ICD-10-CM | POA: Diagnosis not present

## 2021-01-30 DIAGNOSIS — I480 Paroxysmal atrial fibrillation: Secondary | ICD-10-CM | POA: Diagnosis not present

## 2021-01-30 DIAGNOSIS — J449 Chronic obstructive pulmonary disease, unspecified: Secondary | ICD-10-CM | POA: Diagnosis not present

## 2021-01-30 DIAGNOSIS — E7800 Pure hypercholesterolemia, unspecified: Secondary | ICD-10-CM | POA: Diagnosis not present

## 2021-01-30 DIAGNOSIS — I5189 Other ill-defined heart diseases: Secondary | ICD-10-CM | POA: Diagnosis not present

## 2021-01-30 DIAGNOSIS — Z1389 Encounter for screening for other disorder: Secondary | ICD-10-CM | POA: Diagnosis not present

## 2021-01-30 DIAGNOSIS — N1831 Chronic kidney disease, stage 3a: Secondary | ICD-10-CM | POA: Diagnosis not present

## 2021-01-30 DIAGNOSIS — R600 Localized edema: Secondary | ICD-10-CM | POA: Diagnosis not present

## 2021-01-30 DIAGNOSIS — Z0000 Encounter for general adult medical examination without abnormal findings: Secondary | ICD-10-CM | POA: Diagnosis not present

## 2021-02-01 ENCOUNTER — Other Ambulatory Visit: Payer: Medicare Other

## 2021-02-06 ENCOUNTER — Other Ambulatory Visit: Payer: Self-pay

## 2021-02-06 ENCOUNTER — Ambulatory Visit: Payer: Medicare Other | Admitting: Orthopaedic Surgery

## 2021-02-06 ENCOUNTER — Encounter: Payer: Self-pay | Admitting: Orthopaedic Surgery

## 2021-02-06 DIAGNOSIS — M4807 Spinal stenosis, lumbosacral region: Secondary | ICD-10-CM

## 2021-02-06 DIAGNOSIS — M5441 Lumbago with sciatica, right side: Secondary | ICD-10-CM

## 2021-02-06 DIAGNOSIS — M5442 Lumbago with sciatica, left side: Secondary | ICD-10-CM

## 2021-02-06 MED ORDER — METHOCARBAMOL 500 MG PO TABS: 500.0000 mg | ORAL_TABLET | Freq: Four times a day (QID) | ORAL | 1 refills | Status: DC | PRN

## 2021-02-06 NOTE — Progress Notes (Signed)
The patient is a 80 year old female well-known to [place].  She has chronic low back pain and has been having some spasming in both legs.  I put her on baclofen for that and I does help but it makes her too sleepy.  Working to try to switch her to methocarbamol.  We recently sent her for an MRI of her lumbar spine.  She has had interventions by Dr. Ernestina Patches in the past in her back.  We want to get a better idea of what may be going on from her back but could potentially help from an intervention standpoint.  She has been unable to get any type of physical therapy.  She denies any change in bowel bladder function or weakness in her legs.  She does ambulate with a cane.  When I have her stand her pain seems to be in the lower aspect of her lumbar spine both medial and lateral and at the midline.  It seems to radiate just a little bit in the sciatic regions but her bilateral lower extremity exam today is normal.  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  There is some bilateral foraminal stenosis at L4-L5 and L5-S1 but also some arthritic changes of the facet joints at those levels.  There is also some degenerative changes of both SI joints.  Our next step will be setting her up to see Dr. Ernestina Patches again for bilateral L4-L5 facet joint injections versus ESI's.  Her daughter is with her and agrees with this treatment plan.  I have advocated for walking but keeping it on a flat surface.

## 2021-02-08 ENCOUNTER — Telehealth: Payer: Self-pay | Admitting: Physical Medicine and Rehabilitation

## 2021-02-08 NOTE — Telephone Encounter (Signed)
Pt daughter [name] called and wants shena to call her back.  CB [phone]

## 2021-02-08 NOTE — Telephone Encounter (Signed)
Called pt and sch. 

## 2021-02-13 ENCOUNTER — Ambulatory Visit: Payer: Self-pay

## 2021-02-13 ENCOUNTER — Other Ambulatory Visit: Payer: Self-pay

## 2021-02-13 ENCOUNTER — Ambulatory Visit (INDEPENDENT_AMBULATORY_CARE_PROVIDER_SITE_OTHER): Payer: Medicare Other | Admitting: Physical Medicine and Rehabilitation

## 2021-02-13 ENCOUNTER — Encounter: Payer: Self-pay | Admitting: Physical Medicine and Rehabilitation

## 2021-02-13 VITALS — BP 167/83 | HR 74

## 2021-02-13 DIAGNOSIS — M5416 Radiculopathy, lumbar region: Secondary | ICD-10-CM

## 2021-02-13 MED ORDER — METHYLPREDNISOLONE ACETATE 80 MG/ML IJ SUSP
80.0000 mg | Freq: Once | INTRAMUSCULAR | Status: AC
Start: 2021-02-13 — End: 2021-02-13
  Administered 2021-02-13: 80 mg

## 2021-02-13 NOTE — Patient Instructions (Signed)

## 2021-02-13 NOTE — Progress Notes (Signed)
2017 SI joint injection, as well as 2014. Pt state lower back pain. Pt state any movement makes the pain worse. Pt state  She take pain meds to help ease her pain.  Numeric Pain Rating Scale and Functional Assessment Average Pain 9   In the last MONTH (on 0-10 scale) has pain interfered with the following?  1. General activity like being  able to carry out your everyday physical activities such as walking, climbing stairs, carrying groceries, or moving a chair?  Rating(9)   +Driver, +BT, -Dye Allergies.

## 2021-02-13 NOTE — Procedures (Signed)
Lumbosacral Transforaminal Epidural Steroid Injection - Sub-Pedicular Approach with Fluoroscopic Guidance  Patient: Kristin Davidson      Date of Birth: [birth year] MRN: [id number] PCP: [name], MD      Visit Date: 02/13/2021   Universal Protocol:    Date/Time: 02/13/2021  Consent Given By: the patient  Position: PRONE  Additional Comments: Vital signs were monitored before and after the procedure. Patient was prepped and draped in the usual sterile fashion. The correct patient, procedure, and site was verified.   Injection Procedure Details:   Procedure diagnoses: Lumbar radiculopathy [M54.16]    Meds Administered:  Meds ordered this encounter  Medications  . methylPREDNISolone acetate (DEPO-MEDROL) injection 80 mg    Laterality: Bilateral  Location/Site:  L4-L5  Needle:5.0 in., 22 ga.  Short bevel or Quincke spinal needle  Needle Placement: Transforaminal  Findings:    -Comments: Excellent flow of contrast along the nerve, nerve root and into the epidural space.  Initial flow of contrast on the right provided a right arthrogram of the L4-5 facet joint but with repositioning we did get good flow into the foramen.  Procedure Details: After squaring off the end-plates to get a true AP view, the C-arm was positioned so that an oblique view of the foramen as noted above was visualized. The target area is just inferior to the "nose of the scotty dog" or sub pedicular. The soft tissues overlying this structure were infiltrated with 2-3 ml. of 1% Lidocaine without Epinephrine.  The spinal needle was inserted toward the target using a "trajectory" view along the fluoroscope beam.  Under AP and lateral visualization, the needle was advanced so it did not puncture dura and was located close the 6 O'Clock position of the pedical in AP tracterory. Biplanar projections were used to confirm position. Aspiration was confirmed to be negative for CSF and/or blood. A 1-2 ml. volume  of Isovue-250 was injected and flow of contrast was noted at each level. Radiographs were obtained for documentation purposes.   After attaining the desired flow of contrast documented above, a 0.5 to 1.0 ml test dose of 0.25% Marcaine was injected into each respective transforaminal space.  The patient was observed for 90 seconds post injection.  After no sensory deficits were reported, and normal lower extremity motor function was noted,   the above injectate was administered so that equal amounts of the injectate were placed at each foramen (level) into the transforaminal epidural space.   Additional Comments:  No complications occurred Dressing: 2 x 2 sterile gauze and Band-Aid    Post-procedure details: Patient was observed during the procedure. Post-procedure instructions were reviewed.  Patient left the clinic in stable condition.

## 2021-02-13 NOTE — Progress Notes (Signed)
Kristin Davidson - 80 y.o. female MRN [id number]  Date of birth: 01-12-1941  Office Visit Note: Visit Date: 02/13/2021 PCP: [name], MD Referred by: [name], MD  Subjective: Chief Complaint  Patient presents with  . Lower Back - Pain   HPI:  Kristin Davidson is a 80 y.o. female who comes in today at the request of Dr. Jean Rosenthal for planned Bilateral L4-L5 Lumbar  epidural steroid injection with fluoroscopic guidance.  The patient has failed conservative care including home exercise, medications, time and activity modification.  This injection will be diagnostic and hopefully therapeutic.  Please see requesting physician notes for further details and justification. MRI reviewed with images and spine model.  MRI reviewed in the note below.  Last time we saw the patient was in 2017 we completed bilateral sacroiliac joints at the request of Benita Stabile, PA-C.  Her symptoms are across the lower back and she is getting spasm in the legs and I do think this is the moderate stenosis at L4-5 with quite a bit of arthritic change.  Could consider L4-5 facet joint blocks.    ROS Otherwise per HPI.  Assessment & Plan: Visit Diagnoses:    ICD-10-CM   1. Lumbar radiculopathy  M54.16 XR C-ARM NO REPORT    Epidural Steroid injection    methylPREDNISolone acetate (DEPO-MEDROL) injection 80 mg    Plan: No additional findings.   Meds & Orders:  Meds ordered this encounter  Medications  . methylPREDNISolone acetate (DEPO-MEDROL) injection 80 mg    Orders Placed This Encounter  Procedures  . XR C-ARM NO REPORT  . Epidural Steroid injection    Follow-up: Return for visit to requesting physician as needed.   Procedures: No procedures performed  Lumbosacral Transforaminal Epidural Steroid Injection - Sub-Pedicular Approach with Fluoroscopic Guidance  Patient: Kristin Davidson      Date of Birth: [birth year] MRN: [id number] PCP: [name], MD      Visit Date: 02/13/2021    Universal Protocol:    Date/Time: 02/13/2021  Consent Given By: the patient  Position: PRONE  Additional Comments: Vital signs were monitored before and after the procedure. Patient was prepped and draped in the usual sterile fashion. The correct patient, procedure, and site was verified.   Injection Procedure Details:   Procedure diagnoses: Lumbar radiculopathy [M54.16]    Meds Administered:  Meds ordered this encounter  Medications  . methylPREDNISolone acetate (DEPO-MEDROL) injection 80 mg    Laterality: Bilateral  Location/Site:  L4-L5  Needle:5.0 in., 22 ga.  Short bevel or Quincke spinal needle  Needle Placement: Transforaminal  Findings:    -Comments: Excellent flow of contrast along the nerve, nerve root and into the epidural space.  Initial flow of contrast on the right provided a right arthrogram of the L4-5 facet joint but with repositioning we did get good flow into the foramen.  Procedure Details: After squaring off the end-plates to get a true AP view, the C-arm was positioned so that an oblique view of the foramen as noted above was visualized. The target area is just inferior to the "nose of the scotty dog" or sub pedicular. The soft tissues overlying this structure were infiltrated with 2-3 ml. of 1% Lidocaine without Epinephrine.  The spinal needle was inserted toward the target using a "trajectory" view along the fluoroscope beam.  Under AP and lateral visualization, the needle was advanced so it did not puncture dura and was located close the 6 O'Clock position of the  pedical in AP tracterory. Biplanar projections were used to confirm position. Aspiration was confirmed to be negative for CSF and/or blood. A 1-2 ml. volume of Isovue-250 was injected and flow of contrast was noted at each level. Radiographs were obtained for documentation purposes.   After attaining the desired flow of contrast documented above, a 0.5 to 1.0 ml test dose of 0.25% Marcaine  was injected into each respective transforaminal space.  The patient was observed for 90 seconds post injection.  After no sensory deficits were reported, and normal lower extremity motor function was noted,   the above injectate was administered so that equal amounts of the injectate were placed at each foramen (level) into the transforaminal epidural space.   Additional Comments:  No complications occurred Dressing: 2 x 2 sterile gauze and Band-Aid    Post-procedure details: Patient was observed during the procedure. Post-procedure instructions were reviewed.  Patient left the clinic in stable condition.      Clinical History: MRI LUMBAR SPINE WITHOUT CONTRAST  TECHNIQUE: Multiplanar, multisequence MR imaging of the lumbar spine was performed. No intravenous contrast was administered.  COMPARISON:  Lumbar radiographs December 28, 2020.  FINDINGS: Segmentation: Standard segmentation is assumed. Small/hypoplastic ribs at T12 on prior CT abdomen/pelvis. The inferior-most fully formed intervertebral disc is labeled L5-S1.  Alignment: Slight (grade 1) anterolisthesis of L4 on L5. Otherwise, no substantial sagittal subluxation. Mild dextrocurvature.  Vertebrae: Vertebral body heights are maintained. No marrow edema to suggest acute fracture or discitis/osteomyelitis. Degenerative changes of the left greater than right sacroiliac joints, partially imaged.  Conus medullaris and cauda equina: Conus extends to the L1 level. Conus appears normal.  Paraspinal and other soft tissues: Unremarkable.  Disc levels:  T12-L1: No significant disc protrusion, foraminal stenosis, or canal stenosis.  L1-L2: Mild bilateral facet hypertrophy without significant canal stenosis. Mild left foraminal stenosis.  L2-L3: Mild disc bulging and bilateral facet hypertrophy. Resulting mild canal and bilateral foraminal stenosis.  L3-L4: Broad disc bulging, mild-to-moderate bilateral  facet hypertrophy and ligamentum flavum thickening. Resulting mild to moderate canal stenosis. Mild bilateral foraminal stenosis.  L4-L5: Slight (grade 1) anterolisthesis of L4 on L5. Broad disc bulge with moderate bilateral facet hypertrophy and ligamentum flavum thickening. Resulting moderate canal and severe bilateral subarticular recess stenosis. Moderate bilateral foraminal stenosis.  L5-S1: Broad disc bulge, mild to moderate bilateral facet hypertrophy. No significant canal stenosis. Mild bilateral subarticular recess stenosis.Mild-to-moderate bilateral foraminal stenosis.  IMPRESSION: 1. 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 Mild-to-moderate canal stenosis at L3-L4 and mild canal stenosis at L2-L3. 2. Moderate bilateral foraminal stenosis at L4-L5. Mild-to-moderate bilateral foraminal stenosis at L5-S1 and multilevel mild foraminal stenosis. 3. Degenerative changes of the left greater than right sacroiliac joints, partially imaged.   Electronically Signed   By: Margaretha Sheffield MD   On: 01/30/2021 06:11     Objective:  VS:  HT:    WT:   BMI:     BP:(!) 167/83  HR:74bpm  TEMP: ( )  RESP:  Physical Exam Vitals and nursing note reviewed.  Constitutional:      General: She is not in acute distress.    Appearance: Normal appearance. She is not ill-appearing.  HENT:     Head: Normocephalic and atraumatic.     Right Ear: External ear normal.     Left Ear: External ear normal.  Eyes:     Extraocular Movements: Extraocular movements intact.  Cardiovascular:     Rate and Rhythm: Normal rate.  Pulses: Normal pulses.  Pulmonary:     Effort: Pulmonary effort is normal. No respiratory distress.  Abdominal:     General: There is no distension.     Palpations: Abdomen is soft.  Musculoskeletal:        General: Tenderness present.     Cervical back: Neck supple.     Right lower leg: No edema.     Left lower leg: No  edema.     Comments: Patient has good distal strength with no pain over the greater trochanters.  No clonus or focal weakness. Patient somewhat slow to rise from a seated position to full extension.  There is concordant low back pain with facet loading and lumbar spine extension rotation.  There are no definitive trigger points but the patient is somewhat tender across the lower back and PSIS.  There is no pain with hip rotation.   Skin:    Findings: No erythema, lesion or rash.  Neurological:     General: No focal deficit present.     Mental Status: She is alert and oriented to person, place, and time.     Sensory: No sensory deficit.     Motor: No weakness or abnormal muscle tone.     Coordination: Coordination normal.  Psychiatric:        Mood and Affect: Mood normal.        Behavior: Behavior normal.      Imaging: XR C-ARM NO REPORT  Result Date: 02/13/2021 Please see Notes tab for imaging impression.

## 2021-02-15 NOTE — Progress Notes (Deleted)
Cardiology Office Note:    Date:  02/15/2021   ID:  Kristin Davidson, DOB May 07, 1941, MRN [id number]  PCP:  [name], MD  Cardiologist:  Donato Heinz, MD  Electrophysiologist:  None   Referring MD: [name], MD   No chief complaint on file.   History of Present Illness:    Kristin Davidson is a 80 y.o. female with a hx of hypertension, COPD, CKD, atrial fibrillation who presents for follow-up.  She was admitted to [facility] on 05/08/2020 with abdominal pain and nausea/vomiting.  She had multiple episodes of vomiting, following which she developed chest pain that lasted for 5 to 10 minutes.  In the ED, underwent CT abdomen that showed likely colitis PE.  EKG showed sinus bradycardia with PACs, RBBB, LAFB, T wave inversions in leads V3-6.  Labs notable for high-sensitivity troponin 20 >29 > 24 > 22 > 23 > 16.  Echocardiogram showed EF 60 to 65%, mild LVH, grade 2 diastolic dysfunction, normal RV function, mild to moderate MR. For her chest pain, esophagitis was suspected as the cause and no further cardiac work-up was done.  She was noted throughout admission to have sinus bradycardia as low as 40s.  Appeared asymptomatic with this.  She was discharged on 05/10/20 with a cardiac monitor that showed 3% atrial fibrillation burden with average heart rate 130 bpm and 2318 episodes of SVT, longest lasting 53 seconds.  She was seen in A. fib clinic on 05/19/2020, started on Eliquis 5 mg twice daily given CHA2DS2-VASc score 4.  Lexiscan Myoview on 11/01/2020 showed fixed inferior/anteroseptal perfusion defect with normal wall motion, suggesting artifact, likely normal perfusion with EF 64%.  Since last clinic visit,   she reports that she has been doing okay.  Reports she has been having abdominal and chest pain this morning.  Reports she gets this when she is hungry, usually resolves when she eats.  She has not had any food this morning.  She denies any other chest pain or shortness of breath.   She has not been exercising, most exertion she does is walking around her home.  She denies any lightheadedness, syncope, or palpitations.  She brought her BP log with her, has been 160s to 170s over 80s.     Past Medical History:  Diagnosis Date  . Arthritis   . COPD (chronic obstructive pulmonary disease) ([place])   . GERD (gastroesophageal reflux disease)   . Gout   . Hypertension   . Kidney disease     Past Surgical History:  Procedure Laterality Date  . ABDOMINAL HYSTERECTOMY    . ABDOMINAL SURGERY    . BREAST EXCISIONAL BIOPSY Right >10+ yrs ago   benign  . COLONOSCOPY    . KNEE SURGERY      Current Medications: No outpatient medications have been marked as taking for the 02/17/21 encounter (Appointment) with Donato Heinz, MD.     Allergies:   Alendronate sodium, Hydromorphone hcl, Naproxen sodium, Sulfonamide derivatives, and Tramadol hcl   Social History   Socioeconomic History  . Marital status: Widowed    Spouse name: Not on file  . Number of children: Not on file  . Years of education: Not on file  . Highest education level: Not on file  Occupational History  . Not on file  Tobacco Use  . Smoking status: Former Smoker    Types: Cigarettes    Quit date: 06/08/2018    Years since quitting: 2.6  . Smokeless tobacco:  Never Used  Vaping Use  . Vaping Use: Never used  Substance and Sexual Activity  . Alcohol use: No  . Drug use: No  . Sexual activity: Not on file  Other Topics Concern  . Not on file  Social History Narrative  . Not on file   Social Determinants of Health   Financial Resource Strain: Not on file  Food Insecurity: Not on file  Transportation Needs: Not on file  Physical Activity: Not on file  Stress: Not on file  Social Connections: Not on file     Family History: The patient's family history includes Bone cancer in her mother; Heart attack (age of onset: 6) in her father. There is no history of Colon cancer, Esophageal  cancer, Rectal cancer, or Stomach cancer.  ROS:   Please see the history of present illness.     All other systems reviewed and are negative.  EKGs/Labs/Other Studies Reviewed:    The following studies were reviewed today:   EKG:  EKG is ordered today.  The ekg ordered today demonstrates sinus rhythm with sinus arrhythmia, rate 58, RBBB, LAFB  Recent Labs: 05/08/2020: Magnesium 2.1 05/09/2020: ALT 24; Hemoglobin 13.1; Platelets 143; TSH 1.316 07/25/2020: BUN 21; Creatinine, Ser 1.34; Potassium 4.2; Sodium 141  Recent Lipid Panel    Component Value Date/Time   CHOL 125 05/08/2020 0805   TRIG 90 05/08/2020 0805   HDL 38 (L) 05/08/2020 0805   CHOLHDL 3.3 05/08/2020 0805   VLDL 18 05/08/2020 0805   LDLCALC 69 05/08/2020 0805    Physical Exam:    VS:  There were no vitals taken for this visit.    Wt Readings from Last 3 Encounters:  11/01/20 173 lb (78.5 kg)  10/04/20 173 lb 3.2 oz (78.6 kg)  07/12/20 169 lb 3.2 oz (76.7 kg)     GEN:  in no acute distress HEENT: Normal NECK: No JVD; No carotid bruits CARDIAC: RRR, no murmurs, rubs, gallops RESPIRATORY:  Clear to auscultation without rales, wheezing or rhonchi  ABDOMEN: Soft, non-tender, non-distended MUSCULOSKELETAL:  No edema; No deformity  SKIN: Warm and dry NEUROLOGIC:  Alert and oriented x 3 PSYCHIATRIC:  Normal affect   ASSESSMENT:    No diagnosis found. PLAN:    Atrial fibrillation: CHA2DS2-VASc score 4 (age x2, hypertension, female).  3% AF burden on recent monitor, average heart rate 130.  Not on AV nodal blocker given baseline bradycardia.  Echocardiogram 05/09/2020 showed normal LVEF, grade 2 diastolic dysfunction, normal RV function, mild to moderate MR. -Continue Eliquis 5 mg twice daily -Sleep study ordered  Chest pain: Atypical in description.  Lexiscan Myoview on 11/01/2020 showed fixed inferior/anteroseptal perfusion defect with normal wall motion, suggesting artifact, likely normal perfusion with EF  64%..  Suspect likely GI etiology  Hypertension: On Amlodipine 10 mg daily and benazepril 30 mg daily  Snoring: Sleep study ordered as above   RTC in***    Medication Adjustments/Labs and Tests Ordered: Current medicines are reviewed at length with the patient today.  Concerns regarding medicines are outlined above.  No orders of the defined types were placed in this encounter.  No orders of the defined types were placed in this encounter.   There are no Patient Instructions on file for this visit.   Signed, Donato Heinz, MD  02/15/2021 10:46 PM    [place]

## 2021-02-17 ENCOUNTER — Ambulatory Visit: Payer: Medicare Other | Admitting: Cardiology

## 2021-02-27 ENCOUNTER — Other Ambulatory Visit: Payer: Self-pay | Admitting: Orthopaedic Surgery

## 2021-03-10 ENCOUNTER — Ambulatory Visit: Payer: Medicare Other | Admitting: Podiatry

## 2021-03-13 ENCOUNTER — Encounter: Payer: Self-pay | Admitting: Physician Assistant

## 2021-03-21 DIAGNOSIS — H40023 Open angle with borderline findings, high risk, bilateral: Secondary | ICD-10-CM | POA: Diagnosis not present

## 2021-03-22 ENCOUNTER — Other Ambulatory Visit: Payer: Self-pay | Admitting: Orthopaedic Surgery

## 2021-03-22 NOTE — Telephone Encounter (Signed)
ok 

## 2021-03-28 ENCOUNTER — Ambulatory Visit (INDEPENDENT_AMBULATORY_CARE_PROVIDER_SITE_OTHER): Payer: Medicare Other | Admitting: Podiatry

## 2021-03-28 ENCOUNTER — Encounter: Payer: Self-pay | Admitting: Podiatry

## 2021-03-28 ENCOUNTER — Other Ambulatory Visit: Payer: Self-pay

## 2021-03-28 DIAGNOSIS — M79675 Pain in left toe(s): Secondary | ICD-10-CM

## 2021-03-28 DIAGNOSIS — M79674 Pain in right toe(s): Secondary | ICD-10-CM

## 2021-03-28 DIAGNOSIS — M2012 Hallux valgus (acquired), left foot: Secondary | ICD-10-CM

## 2021-03-28 DIAGNOSIS — M2011 Hallux valgus (acquired), right foot: Secondary | ICD-10-CM

## 2021-03-28 DIAGNOSIS — B351 Tinea unguium: Secondary | ICD-10-CM

## 2021-03-28 NOTE — Progress Notes (Signed)
This patient returns to my office for at risk foot care.  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  She says she has PAD.  This patient is unable to cut nails herself since the patient cannot reach her nails.These nails are painful walking and wearing shoes.  This patient presents for at risk foot care today.  General Appearance  Alert, conversant and in no acute stress.  Vascular  Dorsalis pedis and posterior tibial  pulses are palpable  bilaterally.  Capillary return is within normal limits  bilaterally. Temperature is within normal limits  bilaterally. Absent digital hair.  Neurologic  Senn-Weinstein monofilament wire test within normal limits  bilaterally. Muscle power within normal limits bilaterally.  Nails Thick disfigured discolored nails with subungual debris  from hallux to fifth toes bilaterally. No evidence of bacterial infection or drainage bilaterally.  Orthopedic  No limitations of motion  feet .  No crepitus or effusions noted.  No bony pathology or digital deformities noted.  HAV  B/L.  Skin  normotropic skin with no porokeratosis noted bilaterally.  No signs of infections or ulcers noted.     Onychomycosis  Pain in right toes  Pain in left toes  Consent was obtained for treatment procedures.   Mechanical debridement of nails 1-5  bilaterally performed with a nail nipper.  Filed with dremel without incident.    Return office visit    4 months                  Told patient to return for periodic foot care and evaluation due to potential at risk complications.   Gardiner Barefoot DPM

## 2021-04-03 ENCOUNTER — Other Ambulatory Visit: Payer: Self-pay | Admitting: Internal Medicine

## 2021-04-03 DIAGNOSIS — Z1231 Encounter for screening mammogram for malignant neoplasm of breast: Secondary | ICD-10-CM

## 2021-04-06 DIAGNOSIS — I10 Essential (primary) hypertension: Secondary | ICD-10-CM | POA: Diagnosis not present

## 2021-04-06 DIAGNOSIS — E7800 Pure hypercholesterolemia, unspecified: Secondary | ICD-10-CM | POA: Diagnosis not present

## 2021-04-06 DIAGNOSIS — I480 Paroxysmal atrial fibrillation: Secondary | ICD-10-CM | POA: Diagnosis not present

## 2021-04-06 DIAGNOSIS — N1831 Chronic kidney disease, stage 3a: Secondary | ICD-10-CM | POA: Diagnosis not present

## 2021-04-06 DIAGNOSIS — J449 Chronic obstructive pulmonary disease, unspecified: Secondary | ICD-10-CM | POA: Diagnosis not present

## 2021-04-06 DIAGNOSIS — M810 Age-related osteoporosis without current pathological fracture: Secondary | ICD-10-CM | POA: Diagnosis not present

## 2021-04-06 DIAGNOSIS — E1169 Type 2 diabetes mellitus with other specified complication: Secondary | ICD-10-CM | POA: Diagnosis not present

## 2021-04-07 ENCOUNTER — Other Ambulatory Visit: Payer: Self-pay

## 2021-04-07 ENCOUNTER — Ambulatory Visit (INDEPENDENT_AMBULATORY_CARE_PROVIDER_SITE_OTHER): Payer: Medicare Other

## 2021-04-07 ENCOUNTER — Encounter: Payer: Self-pay | Admitting: Emergency Medicine

## 2021-04-07 ENCOUNTER — Ambulatory Visit
Admission: EM | Admit: 2021-04-07 | Discharge: 2021-04-07 | Disposition: A | Discharge status: Home / Self Care | Payer: Medicare Other | Attending: Family Medicine | Admitting: Family Medicine

## 2021-04-07 DIAGNOSIS — M79621 Pain in right upper arm: Secondary | ICD-10-CM | POA: Diagnosis not present

## 2021-04-07 DIAGNOSIS — M79601 Pain in right arm: Secondary | ICD-10-CM

## 2021-04-07 DIAGNOSIS — M19011 Primary osteoarthritis, right shoulder: Secondary | ICD-10-CM | POA: Diagnosis not present

## 2021-04-07 NOTE — ED Provider Notes (Signed)
[place]   [id number] 04/07/21 Arrival Time: 1156  PV:XYIAX PAIN  SUBJECTIVE: History from: patient. Kristin Davidson is a 80 y.o. female complains of right upper arm pain that began 4 days ago.  Reports that she noticed it when she was "flipping the covers" in her bed at home. Describes the pain as constant and achy in character with intermittent sharp pains with activity.  Denies known injuries. Has tried OTC medications without relief. Symptoms are made worse with activity. Denies similar symptoms in the past.  Denies fever, chills, erythema, effusion, weakness, numbness and tingling, saddle paresthesias, loss of bowel or bladder function.      ROS: As per HPI.  All other pertinent ROS negative.     Past Medical History:  Diagnosis Date   Arthritis    COPD (chronic obstructive pulmonary disease) ([facility])    GERD (gastroesophageal reflux disease)    Gout    Hypertension    Kidney disease    Past Surgical History:  Procedure Laterality Date   ABDOMINAL HYSTERECTOMY     ABDOMINAL SURGERY     BREAST EXCISIONAL BIOPSY Right >10+ yrs ago   benign   COLONOSCOPY     KNEE SURGERY     Allergies  Allergen Reactions   Alendronate Sodium Other (See Comments)   Hydromorphone Hcl     REACTION: itching   Naproxen Sodium Itching   Sulfonamide Derivatives     Dizziness   Tramadol Hcl Other (See Comments)    Bloated and constipation    No current facility-administered medications on file prior to encounter.   Current Outpatient Medications on File Prior to Encounter  Medication Sig Dispense Refill   allopurinol (ZYLOPRIM) 300 MG tablet Take 150-300 mg by mouth daily. Alternate from 1/2 and 1 tablet daily  0   amLODipine (NORVASC) 10 MG tablet Take 1 tablet (10 mg total) by mouth daily. 90 tablet 3   apixaban (ELIQUIS) 5 MG TABS tablet Take 1 tablet (5 mg total) by mouth 2 (two) times daily. 180 tablet 1   baclofen (LIORESAL) 10 MG tablet TAKE 1 TABLET(10 MG) BY MOUTH THREE  TIMES DAILY AS NEEDED FOR MUSCLE SPASMS 30 tablet 0   benazepril (LOTENSIN) 20 MG tablet TAKE 1 AND 1/2 TABLETS BY MOUTH ONCE DAILY 135 tablet 1   esomeprazole (NEXIUM) 20 MG capsule Take 20 mg by mouth daily at 12 noon.     Homeopathic Products ([place]) FOAM See admin instructions.     omeprazole (PRILOSEC) 20 MG capsule Take 40 mg by mouth daily.     methocarbamol (ROBAXIN) 500 MG tablet Take 1 tablet (500 mg total) by mouth every 6 (six) hours as needed. 40 tablet 1   Social History   Socioeconomic History   Marital status: Widowed    Spouse name: Not on file   Number of children: Not on file   Years of education: Not on file   Highest education level: Not on file  Occupational History   Not on file  Tobacco Use   Smoking status: Former    Pack years: 0.00    Types: Cigarettes    Quit date: 06/08/2018    Years since quitting: 2.8   Smokeless tobacco: Never  Vaping Use   Vaping Use: Never used  Substance and Sexual Activity   Alcohol use: No   Drug use: No   Sexual activity: Not on file  Other Topics Concern   Not on file  Social History Narrative  Not on file   Social Determinants of Health   Financial Resource Strain: Not on file  Food Insecurity: Not on file  Transportation Needs: Not on file  Physical Activity: Not on file  Stress: Not on file  Social Connections: Not on file  Intimate Partner Violence: Not on file   Family History  Problem Relation Age of Onset   Bone cancer Mother    Heart attack Father 101   Colon cancer Neg Hx    Esophageal cancer Neg Hx    Rectal cancer Neg Hx    Stomach cancer Neg Hx     OBJECTIVE:  Vitals:   04/07/21 1401  BP: (!) 145/86  Pulse: 77  Resp: 15  Temp: 98.4 F (36.9 C)  TempSrc: Oral  SpO2: 92%    General appearance: ALERT; in no acute distress.  Head: NCAT Lungs: Normal respiratory effort CV: pulses 2+ bilaterally. Cap refill < 2 seconds Musculoskeletal:  Inspection: Skin warm, dry, clear and  intact, bruising to inner right upper arm No erythema, effusion noted Palpation: Right inner upper arm tender to palpation ROM: Limited ROM active and passive to right arm Skin: warm and dry Neurologic: Ambulates without difficulty; Sensation intact about the upper/ lower extremities Psychological: alert and cooperative; normal mood and affect  DIAGNOSTIC STUDIES:  DG Humerus Right  Result Date: 04/07/2021 CLINICAL DATA:  Patient c/o RT upper arm pain x 3-4 days. Patient endorses symptoms worsened after " flipping the covers at home". Patient has redness present on upper arm. Patient denies fall or trauma. EXAM: RIGHT HUMERUS - 2+ VIEW COMPARISON:  None. FINDINGS: Suspected calcific rotator cuff tendinopathy. Reduced acromial humeral distance may indicate rotator cuff tear. Subacromial morphology is type 2 (curved). Degenerative spurring of the glenoid. No fracture of the humerus is identified. IMPRESSION: 1. Calcific tendinopathy of the rotator cuff with some reduction in acromial humeral distance raising the possibility of underlying rotator cuff tear. Degenerative spurring of the glenoid. 2. No acute findings are identified. Electronically Signed   By: Van Clines M.D.   On: 04/07/2021 15:02     ASSESSMENT & PLAN:  1. Right arm pain     X-ray negative for fracture  continue conservative management of rest, ice, and gentle stretches Take ibuprofen as needed for pain relief (may cause abdominal discomfort, ulcers, and GI bleeds avoid taking with other NSAIDs) Follow up with orthopedics Suspect soft tissue injury Sling applied to the right arm Return or go to the ER if you have any new or worsening symptoms (fever, chills, chest pain, abdominal pain, changes in bowel or bladder habits, pain radiating into lower legs)    Reviewed expectations re: course of current medical issues. Questions answered. Outlined signs and symptoms indicating need for more acute intervention. Patient  verbalized understanding. After Visit Summary given.        Faustino Congress, NP 04/07/21 1637

## 2021-04-07 NOTE — ED Triage Notes (Addendum)
Patient c/o RT upper arm pain x 3-4 days.   Patient endorses symptoms worsened after " flipping the covers at home".   Patient has redness present on upper arm.   Patient denies fall or trauma.   Patient has used OTC cream with no relied of symptoms.   Patient is currently taking a blood thinner.

## 2021-04-07 NOTE — Discharge Instructions (Addendum)
I suspect that you have a soft tissue injury/bicep injury  We have placed a sling to the right arm, wear this until you are followed up with orthopedics.  X-rays negative for fracture today  May take Tylenol and ibuprofen as needed for pain  Follow-up with orthopedics

## 2021-04-11 ENCOUNTER — Other Ambulatory Visit: Payer: Self-pay | Admitting: Orthopaedic Surgery

## 2021-04-11 NOTE — Telephone Encounter (Signed)
ok 

## 2021-04-13 ENCOUNTER — Ambulatory Visit: Payer: Medicare Other | Admitting: Physician Assistant

## 2021-04-13 ENCOUNTER — Encounter: Payer: Self-pay | Admitting: Physician Assistant

## 2021-04-13 VITALS — BP 142/72 | HR 74 | Ht 61.0 in | Wt 177.1 lb

## 2021-04-13 DIAGNOSIS — R1319 Other dysphagia: Secondary | ICD-10-CM | POA: Diagnosis not present

## 2021-04-13 DIAGNOSIS — G8929 Other chronic pain: Secondary | ICD-10-CM

## 2021-04-13 DIAGNOSIS — R1012 Left upper quadrant pain: Secondary | ICD-10-CM

## 2021-04-13 DIAGNOSIS — K5909 Other constipation: Secondary | ICD-10-CM | POA: Diagnosis not present

## 2021-04-13 MED ORDER — OMEPRAZOLE 20 MG PO CPDR: 40.0000 mg | DELAYED_RELEASE_CAPSULE | Freq: Two times a day (BID) | ORAL | 11 refills | Status: DC

## 2021-04-13 MED ORDER — LINACLOTIDE 145 MCG PO CAPS: 145.0000 ug | ORAL_CAPSULE | Freq: Every day | ORAL | 11 refills | Status: DC

## 2021-04-13 NOTE — Progress Notes (Signed)
Chief Complaint: Dysphagia, constipation and abdominal pain   HPI:    Kristin Davidson is a 80 year old African-American female, known to Dr. Havery [name], with a past medical history of COPD and others listed below, who was referred to me by [name], MD for a complaint of constipation, dysphagia and abdominal pain.      07/11/2009 colonoscopy with diverticula scattered in the sigmoid colon, no polyps or cancers and otherwise normal.  Repeat recommended in 10 years.    10/16/2016 office visit with me, discussed constipation.  At that time prescribed Linzess 145 mcg daily, recommend fiber exercise and water.  Discussed repeating colonoscopy as she did not have any relief after this.  Also had left upper quadrant pain.    10/13/2018 colonoscopy with 1 5 mm polyp and diverticulosis.  Pathology showed hyperplastic polyp and no repeat colonoscopy is recommended.    Today, the patient presents to clinic accompanied by her daughter.  Together they explain that the patient continues to battle this chronic left upper quadrant pain, the patient tells me that this is an increasing issue which seems to be coming along with a problem of "indigestion".  Tells me that when she is eating often food feels like it "does not even make it to my stomach", she has had a few episodes of regurgitation maybe once every 3 to 4 months.  Also describes that the food either "gets stuck there or up here" (pointing to her left upper quadrant).  Daughter explains that she also has dentures which she does not wear because they are uncomfortable which she thinks is making this problem worse.      Along with this describes that she is still having issues with constipation, MiraLAX is not enough.  Recalls being on Linzess which was helpful.   Tells me she "turned 79 and everything started going downhill".    Denies fever, chills, weight loss, blood in her stool or symptoms that awaken her from sleep.  Past Medical History:  Diagnosis  Date   Arthritis    COPD (chronic obstructive pulmonary disease) (HCC)    GERD (gastroesophageal reflux disease)    Gout    Hypertension    Kidney disease     Past Surgical History:  Procedure Laterality Date   ABDOMINAL HYSTERECTOMY     ABDOMINAL SURGERY     BREAST EXCISIONAL BIOPSY Right >10+ yrs ago   benign   COLONOSCOPY     KNEE SURGERY      Current Outpatient Medications  Medication Sig Dispense Refill   albuterol (VENTOLIN HFA) 108 (90 Base) MCG/ACT inhaler Inhale 2 puffs into the lungs every 4 (four) hours as needed.     allopurinol (ZYLOPRIM) 300 MG tablet Take 150-300 mg by mouth daily. Alternate from 1/2 and 1 tablet daily  0   amLODipine (NORVASC) 10 MG tablet Take 1 tablet (10 mg total) by mouth daily. 90 tablet 3   apixaban (ELIQUIS) 5 MG TABS tablet Take 1 tablet (5 mg total) by mouth 2 (two) times daily. 180 tablet 1   baclofen (LIORESAL) 10 MG tablet TAKE 1 TABLET(10 MG) BY MOUTH THREE TIMES DAILY AS NEEDED FOR MUSCLE SPASMS 30 tablet 0   benazepril (LOTENSIN) 20 MG tablet TAKE 1 AND 1/2 TABLETS BY MOUTH ONCE DAILY 135 tablet 1   Homeopathic Products (THERAWORX RELIEF) FOAM See admin instructions.     methocarbamol (ROBAXIN) 500 MG tablet Take 1 tablet (500 mg total) by mouth every 6 (six) hours as needed. [place]  tablet 1   omeprazole (PRILOSEC) 20 MG capsule Take 40 mg by mouth daily.     No current facility-administered medications for this visit.    Allergies as of 04/13/2021 - Review Complete 04/13/2021  Allergen Reaction Noted   Alendronate sodium Other (See Comments) 08/08/2020   Hydromorphone hcl  06/10/2009   Naproxen sodium Itching    Sulfonamide derivatives     Tramadol hcl Other (See Comments)     Family History  Problem Relation Age of Onset   Bone cancer Mother    Heart attack Father 72   Colon cancer Neg Hx    Esophageal cancer Neg Hx    Rectal cancer Neg Hx    Stomach cancer Neg Hx     Social History   Socioeconomic History   Marital  status: Widowed    Spouse name: Not on file   Number of children: Not on file   Years of education: Not on file   Highest education level: Not on file  Occupational History   Not on file  Tobacco Use   Smoking status: Former    Pack years: 0.00    Types: Cigarettes    Quit date: 06/08/2018    Years since quitting: 2.8   Smokeless tobacco: Never  Vaping Use   Vaping Use: Never used  Substance and Sexual Activity   Alcohol use: No   Drug use: No   Sexual activity: Not on file  Other Topics Concern   Not on file  Social History Narrative   Not on file   Social Determinants of Health   Financial Resource Strain: Not on file  Food Insecurity: Not on file  Transportation Needs: Not on file  Physical Activity: Not on file  Stress: Not on file  Social Connections: Not on file  Intimate Partner Violence: Not on file    Review of Systems:    Constitutional: No weight loss, fever or chills Skin: No rash  Cardiovascular: No chest pain Respiratory: No SOB  Gastrointestinal: See HPI and otherwise negative Genitourinary: No dysuria  Neurological: No headache Musculoskeletal: No new muscle or joint pain Hematologic: No bleeding  Psychiatric: No history of depression or anxiety   Physical Exam:  Vital signs: BP (!) 142/72 (BP Location: Left Arm, Patient Position: Sitting, Cuff Size: Normal)   Pulse 74   Ht 5\' 1"  (1.549 m)   Wt 177 lb 2 oz (80.3 kg)   SpO2 98%   BMI 33.47 kg/m    Constitutional:   Pleasant AA female appears to be in NAD, Well developed, Well nourished, alert and cooperative Head:  Normocephalic and atraumatic. Eyes:   PEERL, EOMI. No icterus. Conjunctiva pink. Ears:  Normal auditory acuity. Neck:  Supple Throat: Oral cavity and pharynx without inflammation, swelling or lesion.  Respiratory: Respirations even and unlabored. Lungs clear to auscultation bilaterally.   No wheezes, crackles, or rhonchi.  Cardiovascular: Normal S1, S2. No MRG. Regular rate and  rhythm. No peripheral edema, cyanosis or pallor.  Gastrointestinal:  Soft, nondistended, marked left upper quadrant TTP to only light palpation with some involuntary guarding, normal bowel sounds. No appreciable masses or hepatomegaly. Rectal:  Not performed.  Msk:  Symmetrical without gross deformities. Without edema, no deformity or joint abnormality.  Neurologic:  Alert and  oriented x4;  grossly normal neurologically.  Skin:   Dry and intact without significant lesions or rashes. Psychiatric: Demonstrates good judgement and reason without abnormal affect or behaviors.  RELEVANT LABS AND IMAGING: CBC  Component Value Date/Time   WBC 5.5 05/09/2020 0553   RBC 4.65 05/09/2020 0553   HGB 13.1 05/09/2020 0553   HCT 42.3 05/09/2020 0553   PLT 143 (L) 05/09/2020 0553   MCV 91.0 05/09/2020 0553   MCH 28.2 05/09/2020 0553   MCHC 31.0 05/09/2020 0553   RDW 13.2 05/09/2020 0553   LYMPHSABS 1.2 05/09/2020 0553   MONOABS 0.6 05/09/2020 0553   EOSABS 0.2 05/09/2020 0553   BASOSABS 0.0 05/09/2020 0553    CMP     Component Value Date/Time   NA 141 07/25/2020 1120   K 4.2 07/25/2020 1120   CL 103 07/25/2020 1120   CO2 25 07/25/2020 1120   GLUCOSE 109 (H) 07/25/2020 1120   GLUCOSE 119 (H) 05/09/2020 0553   BUN 21 07/25/2020 1120   CREATININE 1.34 (H) 07/25/2020 1120   CALCIUM 9.4 07/25/2020 1120   PROT 6.2 (L) 05/09/2020 0553   ALBUMIN 3.4 (L) 05/09/2020 0553   AST 23 05/09/2020 0553   ALT 24 05/09/2020 0553   ALKPHOS 68 05/09/2020 0553   BILITOT 0.4 05/09/2020 0553   GFRNONAA 38 (L) 07/25/2020 1120   GFRAA 43 (L) 07/25/2020 1120    Assessment: 1.  Constipation: Chronic for the patient, seen better on Linzess in the past 2.  Chronic left upper quadrant pain: Thought to possibly be musculoskeletal in the past, normal colonoscopy since patient has had pain 3.  Dysphagia: New symptom for the patient, she is on Omeprazole daily but denies any breakthrough reflux symptoms; consider  stricture versus dysmotility versus other  Plan: 1.  Discussed dysphagia and indigestion.  Recommend a barium swallow with tablet for further evaluation. 2.  Increased patient's Omeprazole to 40 mg twice daily, 30-60 minutes before breakfast and dinner.  Prescribed #60 with 3 refills. 3.  Discussed anti-dysphagia measures.  Also explained that she should go back to get her dentures properly fitted as this is not helping with any of her digestive problems. 4.  Encouraged the patient increase water intake 5.  Prescribed Linzess 145 mcg daily.  #30 with 11 refills. 6.  Patient to follow in clinic per recommendations after swallow study.  Ellouise Newer, PA-C [facility] 04/13/2021, 11:03 AM  Cc: [name], MD

## 2021-04-13 NOTE — Progress Notes (Signed)
Agree with assessment with the following thoughts. 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  Given this persistent issue and her abdominal pain, EGD with empiric dilation may be reasonable to further evaluate and treat. She has had a relatively recent nuclear stress test which was negative. If she is willing to do EGD I would cancel barium study and proceed with EGD. It appears she had a CT scan for abdominal pain last year which was otherwise without a clear cause.

## 2021-04-13 NOTE — Patient Instructions (Signed)
If you are age 80 or older, your body mass index should be between 23-30. Your Body mass index is 33.47 kg/m. If this is out of the aforementioned range listed, please consider follow up with your Primary Care Provider. __________________________________________________________  The [facility] providers would like to encourage you to use [facility] to communicate with providers for non-urgent requests or questions.  Due to long hold times on the telephone, sending your provider a message by [facility] may be a faster and more efficient way to get a response.  Please allow 48 business hours for a response.  Please remember that this is for non-urgent requests.   You have been scheduled for a Barium Esophogram at [facility]iology (1st floor of the hospital) on 04/17/2021 at 11:00 am. Please arrive 15 minutes prior to your appointment for registration. Make certain not to have anything to eat or drink 3 hours prior to your test. If you need to reschedule for any reason, please contact radiology at [phone] to do so. __________________________________________________________________ A barium swallow is an examination that concentrates on views of the esophagus. This tends to be a double contrast exam (barium and two liquids which, when combined, create a gas to distend the wall of the oesophagus) or single contrast (non-ionic iodine based). The study is usually tailored to your symptoms so a good history is essential. Attention is paid during the study to the form, structure and configuration of the esophagus, looking for functional disorders (such as aspiration, dysphagia, achalasia, motility and reflux) EXAMINATION You may be asked to change into a gown, depending on the type of swallow being performed. A radiologist and radiographer will perform the procedure. The radiologist will advise you of the type of contrast selected for your procedure and direct you during the exam. You will be asked to stand,  sit or lie in several different positions and to hold a small amount of fluid in your mouth before being asked to swallow while the imaging is performed .In some instances you may be asked to swallow barium coated marshmallows to assess the motility of a solid food bolus. The exam can be recorded as a digital or video fluoroscopy procedure. POST PROCEDURE It will take 1-2 days for the barium to pass through your system. To facilitate this, it is important, unless otherwise directed, to increase your fluids for the next 24-48hrs and to resume your normal diet.  This test typically takes about 30 minutes to perform. __________________________________________________________________________________  Increase your Omeprazole 40 mg to 1 capsule twice daily  START Linzess 145 mcg 1 capsule daily  These medications have been sent to your pharmacy.  Follow up pending the results of your Barium Swallow or as needed.  Thank you for entrusting me with your care and choosing [facility].  Ellouise Newer, PA-C

## 2021-04-14 ENCOUNTER — Telehealth: Payer: Self-pay

## 2021-04-14 NOTE — Telephone Encounter (Signed)
[facility] Pre-operative Risk Assessment     Request for surgical clearance:     Endoscopy Procedure  What type of surgery is being performed?     Upper endoscopy  When is this surgery scheduled?     05/23/21  What type of clearance is required ?   Pharmacy  Are there any medications that need to be held prior to surgery and how long? Eliquis - 2 days  Practice name and name of physician performing surgery?      [facility]  What is your office phone and fax number?      Phone- [phone]  [phone]  Anesthesia type (None, local, MAC, general) ?       MAC

## 2021-04-14 NOTE — Telephone Encounter (Signed)
1st attempt to reach pt regarding the need for an appointment for surgical clearance, the voicemail box was full.  If pt calls back, please schedule appointment.

## 2021-04-14 NOTE — Telephone Encounter (Addendum)
   Name: Kristin Davidson  DOB: 29-May-1941  MRN: [id number]   Primary Cardiologist: Donato Heinz, MD  Chart reviewed as part of pre-operative protocol coverage. Patient was contacted 04/14/2021 in reference to pre-operative risk assessment for pending surgery as outlined below.  Kristin Davidson was last seen 09/2020 by Dr Gardiner Rhyme with history outlined to include HTN, COPD, CKD, atrial fib, RBBB, LAFB, PACs, esophagitis, SVT, colitis, fatty liver by CT. At last OV she was having atypical CP and stress test was normal so this was felt GI in nature. 2D echo 05/2020 showed EF 60-65%, grade 2 DD, mild-moderate MR.   I reached out to patient and daughter. Patient feels she is doing well but daughter states they were actually in need of scheduling an appointment because her mom has been noticing some increased swelling at times. They would her to be seen in the office first, so would recommend scheduling OV for assessment at which time preop clearance can also be reviewed by provider.  Pre-op covering staff: - Please schedule appointment and call patient to inform them. - Please contact requesting surgeon's office via preferred method (i.e, phone, fax) to inform them of need for appointment prior to surgery.  Will route to pharm pool for input on anticoagulation so that this info is available for the provider evaluating the patient.  Charlie Pitter, PA-C  04/14/2021, 11:53 AM

## 2021-04-14 NOTE — Telephone Encounter (Signed)
Patient with diagnosis of A Fib on Eliquis for anticoagulation.    Procedure: Endoscopy Date of procedure: 05/23/21   CHA2DS2-VASc Score = 4  This indicates a 4.8% annual risk of stroke. The patient's score is based upon: CHF History: No HTN History: Yes Diabetes History: No Stroke History: No Vascular Disease History: No    CrCl 44 mL/min Platelet count 143K    Per office protocol, patient can hold Eliquis for 1-2 days prior to procedure.

## 2021-04-14 NOTE — Telephone Encounter (Signed)
Patient is on Eliquis, will send cardiac clearance request in alternate telephone encounter.

## 2021-04-14 NOTE — Telephone Encounter (Signed)
Spoke with patient and her daughter Kristin Davidson in regards to recommendations. Patient would like to proceed with EGD with dilation. Patient has been scheduled for a virtual pre-visit on Tuesday, 05/02/21 at 11 am. Patient is scheduled for EGD with Dil on Tuesday, 05/23/21 at 1:30 PM, arriving at 12:30 PM with a care partner. Patient and daughter are aware that barium esophogram will be cancelled. Patient and her daughter verbalized understanding of all information and had no concerns at the end of the call.

## 2021-04-14 NOTE — Telephone Encounter (Signed)
Pharmacy input noted, will be available for provider seeing patient in clinic (appointment request was forwarded to callback team as below, awaiting scheduling). Will remove from preop APP box. Saga Balthazar PA-C

## 2021-04-14 NOTE — Telephone Encounter (Signed)
-----   Message from Levin Erp, [place] sent at 04/14/2021  9:52 AM EDT ----- Regarding: EGD Dr. Havery [name] would like to go ahead and do EGD. Can you call and offer to patient, if she would like to proceed can cancel esophagram.  Thanks-JLL ----- Message ----- From: Yetta Flock, MD Sent: 04/13/2021   5:21 PM EDT To: Levin Erp, PA     ----- Message ----- From: Levin Erp, [place] Sent: 04/13/2021   1:11 PM EDT To: Yetta Flock, MD

## 2021-04-17 ENCOUNTER — Ambulatory Visit (HOSPITAL_COMMUNITY): Payer: Medicare Other

## 2021-04-19 NOTE — Telephone Encounter (Signed)
2nd attempt to reach pt regarding the need for an appointment for surgical clearance, voicemail is full.

## 2021-04-20 NOTE — Telephone Encounter (Signed)
I tried to reach pt to set up appt, vm is full and could not leave a message to call back. While I was trying to call the pt, her daughter [name] called the office. I s/w both the pt and her daughter in regards that the pt needed an appt for pre op clearance. I advised the soonest I can get her in is 05/09/21 at the [facility] location with Laurann Montana, NP. Both the pt and her daughter are agreeable to plan of care and thanked me for the help. No DPR on file to s/w the pt's daughter, though I did obtain ok to speak with the daughter with the pt on the phone as well. I have given the address for [address] location. Appt for pre op clearance 05/09/21 @ 11:30 with Laurann Montana, NP. I will send notes to NP for upcoming appt. Will send FYI to surgeon's office pt has appt 05/09/21.

## 2021-05-01 ENCOUNTER — Other Ambulatory Visit: Payer: Self-pay | Admitting: Orthopaedic Surgery

## 2021-05-01 NOTE — Telephone Encounter (Signed)
ok 

## 2021-05-02 ENCOUNTER — Ambulatory Visit (AMBULATORY_SURGERY_CENTER): Payer: Medicare Other

## 2021-05-02 VITALS — Ht 61.0 in | Wt 172.0 lb

## 2021-05-02 DIAGNOSIS — H269 Unspecified cataract: Secondary | ICD-10-CM

## 2021-05-02 DIAGNOSIS — M545 Low back pain, unspecified: Secondary | ICD-10-CM

## 2021-05-02 DIAGNOSIS — R1012 Left upper quadrant pain: Secondary | ICD-10-CM

## 2021-05-02 DIAGNOSIS — R1319 Other dysphagia: Secondary | ICD-10-CM

## 2021-05-02 DIAGNOSIS — G709 Myoneural disorder, unspecified: Secondary | ICD-10-CM

## 2021-05-02 DIAGNOSIS — G8929 Other chronic pain: Secondary | ICD-10-CM

## 2021-05-02 HISTORY — DX: Myoneural disorder, unspecified: G70.9

## 2021-05-02 HISTORY — DX: Unspecified cataract: H26.9

## 2021-05-02 HISTORY — DX: Low back pain, unspecified: M54.50

## 2021-05-02 NOTE — Progress Notes (Signed)
No egg or soy allergy known to patient  No issues with past sedation with any surgeries or procedures Patient denies ever being told they had issues or difficulty with intubation  No FH of Malignant Hyperthermia No diet pills per patient No home 02 use per patient    Pt is on Eliquis, hold ordered for 2 days, daughter verb understanding that last dose will be 8/13 until after the procedure.    Pt denies issues with constipation  No A fib or A flutter  EMMI video to pt or via [place] [facility] guidelines implemented in PV today with Pt and RN   EGD   Due to the COVID-19 pandemic we are asking patients to follow certain guidelines.  Pt aware of COVID protocols and [facility] guidelines

## 2021-05-03 ENCOUNTER — Telehealth: Payer: Self-pay | Admitting: Physician Assistant

## 2021-05-03 NOTE — Telephone Encounter (Signed)
Pt's daughter [name] called to inform that Linzess is not working and pt is still constipated. She would like to know if there is anything else that pt could take to help. Pls call [name] at [phone]. or call pt directly. It is ok to leave a detailed vm in both phones.

## 2021-05-03 NOTE — Telephone Encounter (Signed)
I spoke with the patient's daughter [name]. She is advised she may add the Miralax as needed.  They will call back for additional questions or concerns

## 2021-05-08 ENCOUNTER — Ambulatory Visit: Payer: Medicare Other | Admitting: Podiatry

## 2021-05-09 ENCOUNTER — Other Ambulatory Visit (HOSPITAL_BASED_OUTPATIENT_CLINIC_OR_DEPARTMENT_OTHER): Payer: Self-pay | Admitting: Family

## 2021-05-09 ENCOUNTER — Encounter (HOSPITAL_BASED_OUTPATIENT_CLINIC_OR_DEPARTMENT_OTHER): Payer: Self-pay | Admitting: Family

## 2021-05-09 ENCOUNTER — Other Ambulatory Visit: Payer: Self-pay

## 2021-05-09 ENCOUNTER — Ambulatory Visit (HOSPITAL_BASED_OUTPATIENT_CLINIC_OR_DEPARTMENT_OTHER): Payer: Medicare Other | Admitting: Family

## 2021-05-09 VITALS — BP 140/76 | HR 74 | Ht 61.0 in | Wt 171.0 lb

## 2021-05-09 DIAGNOSIS — I10 Essential (primary) hypertension: Secondary | ICD-10-CM

## 2021-05-09 DIAGNOSIS — R600 Localized edema: Secondary | ICD-10-CM

## 2021-05-09 DIAGNOSIS — Z01810 Encounter for preprocedural cardiovascular examination: Secondary | ICD-10-CM

## 2021-05-09 DIAGNOSIS — I480 Paroxysmal atrial fibrillation: Secondary | ICD-10-CM

## 2021-05-09 DIAGNOSIS — I5189 Other ill-defined heart diseases: Secondary | ICD-10-CM | POA: Diagnosis not present

## 2021-05-09 DIAGNOSIS — Z7901 Long term (current) use of anticoagulants: Secondary | ICD-10-CM

## 2021-05-09 MED ORDER — FUROSEMIDE 20 MG PO TABS: 20.0000 mg | ORAL_TABLET | Freq: Every day | ORAL | 2 refills | Status: DC

## 2021-05-09 NOTE — Progress Notes (Signed)
Office Visit    Patient Name: Kristin Davidson Date of Encounter: 05/09/2021  PCP:  [name], [place]  Cardiologist:  Donato Heinz, MD  Advanced Practice Provider:  No care team member to display Electrophysiologist:  None   Chief Complaint    Kristin Davidson is a 80 y.o. 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 female with a hx of HTN, COPD, CKD, atrial fibrillation, RBBB last seen 10/04/20.  Previous admission 05/08/20 with chest pain with diagnosis of colitis. HS troponin 20>29>24>22>23>16. Echo with normal LVEF 60-65%, mild LVH, gr2DD, normal RV function, mild to moderate MR. Esophagitis was presumed cause of her chest pain. She had bradycardia with as low as 40s which was asymptomatic. She was discharged with cardiac event monitor showing 3% atrial fibrillation  burden with average HR 130bpm and 2318 episodes of SVT, longest 53 sec. She was started on Eliquis '5mg'$  BID CHADS2VASC of 4.  She was last seen by Dr. Gardiner Rhyme 10/04/20 doing overall well from a cardiac perspective. She reported abdominal and chest pain which resolved when she ate. She was recommended for sleep study. Given elevated BP her Amlodipine was increased to '10mg'$  daily. Lexiscan myoview was ordered which was low risk study.   She presents today for follow up with her daughter. She has EGD upcoming for continued workup of GI issues. Notes lower extremity edema which has been going on for about 3 weeks or a bit longer per her daughters report. It is wors in the evenings and better in the morning. During the day she sleeps most of the day in her chair. She does report some leg pain and wonders if this is related to her swelling. Blood pressure at home has been 130s-150s by her home log. No chest pain, pressure, tightness. No dyspnea on exertion, orthopnea, PND. Drinks about about three 20 ounce bottles of fluid per day. Endorses following a low salt diet.   EKGs/Labs/Other Studies Reviewed:   The following studies were reviewed today:  Lexiscan myoview 11/01/20 There was no ST segment deviation noted during stress. The left ventricular ejection fraction is normal (55-65%).  Nuclear stress EF: 64%. Defect 1: There is a small defect of mild severity present in the basal inferior, mid inferior and apical inferior location. Defect 2: There is a small defect of mild severity present in the basal anteroseptal, mid anteroseptal and apical septal location. The study is normal. This is a low risk study.   1. Fixed inferior and anteroseptal perfusion defects with normal wall motion in these regions, suggesting artifact 2. Low risk study  EKG:  EKG is ordered today.  The ekg ordered today demonstrates NSR 74 bpm with short PR, RBBB, LAFB  Recent Labs: 07/25/2020: BUN 21; Creatinine, Ser 1.34;  Potassium 4.2; Sodium 141  Recent Lipid Panel    Component Value Date/Time   CHOL 125 05/08/2020 0805   TRIG 90 05/08/2020 0805   HDL 38 (L) 05/08/2020 0805   CHOLHDL 3.3 05/08/2020 0805   VLDL 18 05/08/2020 0805   LDLCALC 69 05/08/2020 0805   Home Medications   Current Meds  Medication Sig   albuterol (VENTOLIN HFA) 108 (90 Base) MCG/ACT inhaler Inhale 2 puffs into the lungs every 4 (four) hours as needed.   allopurinol (ZYLOPRIM) 300 MG tablet Take 150-300 mg by mouth daily. Alternate from 1/2 and 1 tablet daily   amLODipine (NORVASC) 10 MG tablet Take 1 tablet (10 mg total) by mouth daily.   apixaban (ELIQUIS) 5 MG TABS tablet Take 1 tablet (5 mg total) by mouth 2 (two) times daily.   baclofen (LIORESAL) 10 MG tablet TAKE 1 TABLET(10 MG) BY MOUTH THREE TIMES DAILY AS NEEDED FOR MUSCLE SPASMS   benazepril (LOTENSIN) 20 MG tablet TAKE 1 AND 1/2 TABLETS BY MOUTH ONCE DAILY   Homeopathic Products ([place]) FOAM See admin instructions.   linaclotide (LINZESS) 145 MCG CAPS capsule Take 1 capsule (145 mcg total) by mouth daily before breakfast.   omeprazole (PRILOSEC) 20 MG capsule Take 2 capsules (40 mg total) by mouth 2 (two) times daily before a meal.     Review of Systems      All other systems reviewed and are otherwise negative except as noted above.  Physical Exam    VS:  BP 140/76   Pulse 74   Ht '5\' 1"'$  (1.549 m)   Wt 171 lb (77.6 kg)   SpO2 94%   BMI 32.31 kg/m  , BMI Body mass index is 32.31 kg/m.  Wt Readings from Last 3 Encounters:  05/09/21 171 lb (77.6 kg)  05/02/21 172 lb (78 kg)  04/13/21 177 lb 2 oz (80.3 kg)    GEN: Well nourished, well developed, in no acute distress. HEENT: normal. Neck: Supple, no JVD, carotid bruits, or masses. Cardiac: RRR, no murmurs, rubs, or gallops. No clubbing, cyanosis, edema.  Radials/PT 2+ and equal bilaterally.  Respiratory:  Respirations regular and unlabored, clear to auscultation bilaterally. GI: Soft,  nontender, nondistended. MS: No deformity or atrophy. Skin: Warm and dry, no rash. Neuro:  Strength and sensation are intact. Psych: Normal affect.  Assessment & Plan    Preoperative cardiovascular clearance - Myoview 10/2020 low risk study. No anginal symptoms. Endoscopy is low risk procedure. She is deemed acceptable risk for the planned procedure without need for additional cardiovascular testing. May hold Eliquis 1-2 days prior to procedure per pharmacy review and office protocols. Will route to GI so they are aware.   LE edema / diastolic dysfunction - Reports 3+ week history of LE edema. Sits with legs in dependent position. Likely multifactorial diastolic dysfunction and venous insufficiency. No  shortness of breath, orthopnea, PND nor indication for repeat echo at this time. Start Furosemide '20mg'$  daily. Plan for BMP in 1 week to reassess. Low salt diet, <2L fluid restriction, leg elevation encouraged.   Chest pain - No recurrence. Lexiscan 09/2020 no ischemia. No indication for further evaluation.   HTN - BP mildly elevated. Add Lasix, as above. If BP control does not improve, consider additional agent at follow up.   PAF / Chronic anticoagulation - Maintaining NSR. Echo 05/09/20 with LVEF 60-65%, gr2DD, normal RV function, mild to moderate MR. Continue Eliquis '5mg'$  BID. No AV nodal blocking agent due to previous bradycardia.   Disposition: Follow up in 1 month(s) with Dr. Gardiner Rhyme or APP.  Signed, Loel Dubonnet, NP 05/09/2021, 11:34 AM [place]

## 2021-05-09 NOTE — Patient Instructions (Addendum)
Medication Instructions:  Your physician has recommended you make the following change in your medication:    START Furosemide (Lasix)   You may hold Eliquis 1-2 days prior to endoscopy as directed by Dr. Havery [name].   *If you need a refill on your cardiac medications before your next appointment, please call your pharmacy*   Lab Work: Your physician recommends that you return for lab work in 1 week at [employer] for [facility]  If you have labs (blood work) drawn today and your tests are completely normal, you will receive your results only by: [place] (if you have [place]) OR A paper copy in the mail If you have any lab test that is abnormal or we need to change your treatment, we will call you to review the results.   Testing/Procedures: Your EKG today was stable compared to previous  Follow-Up: At [facility], you and your health needs are our priority.  As part of our continuing mission to provide you with exceptional heart care, we have created designated Provider Care Teams.  These Care Teams include your primary Cardiologist (physician) and Advanced Practice Providers (APPs -  Physician Assistants and Nurse Practitioners) who all work together to provide you with the care you need, when you need it.  We recommend signing up for the patient portal called "MyChart".  Sign up information is provided on this After Visit Summary.  MyChart is used to connect with patients for Virtual Visits (Telemedicine).  Patients are able to view lab/test results, encounter notes, upcoming appointments, etc.  Non-urgent messages can be sent to your provider as well.   To learn more about what you can do with MyChart, go to [email or web address].    Your next appointment:   1 month(s)  The format for your next appointment:   In Person  Provider:   You may see Donato Heinz, MD or one of the following Advanced Practice Providers on your designated Care Team:   Almyra Deforest,  PA-C Fabian Sharp, [place] or  Roby Lofts, [place]   Other Instructions  To prevent or reduce lower extremity swelling: Eat a low salt diet. Salt makes the body hold onto extra fluid which causes swelling. Sit with legs elevated. For example, in the recliner or on an [place].  Wear knee-high compression stockings during the daytime. Ones labeled 15-20 mmHg provide good compression.   Exercises to do While Sitting  Exercises that you do while sitting (chair exercises) can give you many of the same benefits as full exercise. Benefits include strengthening your heart, burning calories, and keeping muscles and joints healthy. Exercise can also improve your mood and help with depression andanxiety. You may benefit from chair exercises if you are unable to do standing exercises because of: Diabetic foot pain. Obesity. Illness. Arthritis. Recovery from surgery or injury. Breathing problems. Balance problems. Another type of disability. Before starting chair exercises, check with your health care provider or a physical therapist to find out how much exercise you can tolerate and which exercises are safe for you. If your health care provider approves: Start out slowly and build up over time. Aim to work up to about 10-20 minutes for each exercise session. Make exercise part of your daily routine. Drink water when you exercise. Do not wait until you are thirsty. Drink every 10-15 minutes. Stop exercising right away if you have pain, nausea, shortness of breath, or dizziness. If you are exercising in a wheelchair, make sure to lock the wheels. Ask your health  care provider whether you can do tai chi or yoga. Many positions in these mind-body exercises can be modified to do while seated. Warm-up Before starting other exercises: Sit up as straight as you can. Have your knees bent at 90 degrees, which is the shape of the capital letter "L." Keep your feet flat on the floor. Sit at the front edge of  your chair, if you can. Pull in (tighten) the muscles in your abdomen and stretch your spine and neck as straight as you can. Hold this position for a few minutes. Breathe in and out evenly. Try to concentrate on your breathing, and relax your mind. Stretching Exercise A: Arm stretch Hold your arms out straight in front of your body. Bend your hands at the wrist with your fingers pointing up, as if signaling someone to stop. Notice the slight tension in your forearms as you hold the position. Keeping your arms out and your hands bent, rotate your hands outward as far as you can and hold this stretch. Aim to have your thumbs pointing up and your pinkie fingers pointing down. Slowly repeat arm stretches for one minute as tolerated. Exercise B: Leg stretch If you can move your legs, try to "draw" letters on the floor with the toes of your foot. Write your name with one foot. Write your name with the toes of your other foot. Slowly repeat the movements for one minute as tolerated. Exercise C: Reach for the sky Reach your hands as far over your head as you can to stretch your spine. Move your hands and arms as if you are climbing a rope. Slowly repeat the movements for one minute as tolerated. Range of motion exercises Exercise A: Shoulder roll Let your arms hang loosely at your sides. Lift just your shoulders up toward your ears, then let them relax back down. When your shoulders feel loose, rotate your shoulders in backward and forward circles. Do shoulder rolls slowly for one minute as tolerated. Exercise B: March in place As if you are marching, pump your arms and lift your legs up and down. Lift your knees as high as you can. If you are unable to lift your knees, just pump your arms and move your ankles and feet up and down. March in place for one minute as tolerated. Exercise C: Seated jumping jacks Let your arms hang down straight. Keeping your arms straight, lift them up over your  head. Aim to point your fingers to the ceiling. While you lift your arms, straighten your legs and slide your heels along the floor to your sides, as wide as you can. As you bring your arms back down to your sides, slide your legs back together. If you are unable to use your legs, just move your arms. Slowly repeat seated jumping jacks for one minute as tolerated. Strengthening exercises Exercise A: Shoulder squeeze Hold your arms straight out from your body to your sides, with your elbows bent and your fists pointed at the ceiling. Keeping your arms in the bent position, move them forward so your elbows and forearms meet in front of your face. Open your arms back out as wide as you can with your elbows still bent, until you feel your shoulder blades squeezing together. Hold for 5 seconds. Slowly repeat the movements forward and backward for one minute as tolerated. Contact a health care provider if you: Had to stop exercising due to any of the following: Pain. Nausea. Shortness of breath. Dizziness. Fatigue. Have  significant pain or soreness after exercising. Get help right away if you have: Chest pain. Difficulty breathing. These symptoms may represent a serious problem that is an emergency. Do not wait to see if the symptoms will go away. Get medical help right away. Call your local emergency services (911 in the [place] Do not drive yourself to the hospital. This information is not intended to replace advice given to you by your health care provider. Make sure you discuss any questions you have with your healthcare provider. Document Revised: 01/04/2020 Document Reviewed: 01/21/2020 Elsevier Patient Education  2022 [employer].

## 2021-05-09 NOTE — Progress Notes (Signed)
Error.   Loel Dubonnet, NP

## 2021-05-11 NOTE — Telephone Encounter (Signed)
Per 05/09/21 Heart and vascular office note, pt is OK to hold Eliquis 2 days prior to upcoming EGD with Dr. Havery [name]. Last dose of Eliquis will be 05/20/21, pt will resume as directed after EGD.   Lm on vm for Kristin Davidson to return call to discuss Eliquis hold.

## 2021-05-17 ENCOUNTER — Other Ambulatory Visit: Payer: Self-pay | Admitting: Orthopaedic Surgery

## 2021-05-17 NOTE — Telephone Encounter (Signed)
ok 

## 2021-05-19 DIAGNOSIS — I5189 Other ill-defined heart diseases: Secondary | ICD-10-CM | POA: Diagnosis not present

## 2021-05-22 DIAGNOSIS — I480 Paroxysmal atrial fibrillation: Secondary | ICD-10-CM | POA: Diagnosis not present

## 2021-05-22 DIAGNOSIS — I10 Essential (primary) hypertension: Secondary | ICD-10-CM | POA: Diagnosis not present

## 2021-05-22 DIAGNOSIS — E1169 Type 2 diabetes mellitus with other specified complication: Secondary | ICD-10-CM | POA: Diagnosis not present

## 2021-05-22 DIAGNOSIS — N1831 Chronic kidney disease, stage 3a: Secondary | ICD-10-CM | POA: Diagnosis not present

## 2021-05-22 DIAGNOSIS — M810 Age-related osteoporosis without current pathological fracture: Secondary | ICD-10-CM | POA: Diagnosis not present

## 2021-05-22 DIAGNOSIS — J449 Chronic obstructive pulmonary disease, unspecified: Secondary | ICD-10-CM | POA: Diagnosis not present

## 2021-05-22 DIAGNOSIS — E7800 Pure hypercholesterolemia, unspecified: Secondary | ICD-10-CM | POA: Diagnosis not present

## 2021-05-23 ENCOUNTER — Encounter: Payer: Self-pay | Admitting: Gastroenterology

## 2021-05-23 ENCOUNTER — Ambulatory Visit (AMBULATORY_SURGERY_CENTER): Payer: Medicare Other | Admitting: Gastroenterology

## 2021-05-23 VITALS — BP 121/64 | HR 53 | Temp 98.4°F | Resp 21 | Ht 61.0 in | Wt 172.0 lb

## 2021-05-23 DIAGNOSIS — R1319 Other dysphagia: Secondary | ICD-10-CM

## 2021-05-23 DIAGNOSIS — K31819 Angiodysplasia of stomach and duodenum without bleeding: Secondary | ICD-10-CM | POA: Diagnosis not present

## 2021-05-23 DIAGNOSIS — R1012 Left upper quadrant pain: Secondary | ICD-10-CM

## 2021-05-23 DIAGNOSIS — R1310 Dysphagia, unspecified: Secondary | ICD-10-CM | POA: Diagnosis not present

## 2021-05-23 DIAGNOSIS — G8929 Other chronic pain: Secondary | ICD-10-CM

## 2021-05-23 DIAGNOSIS — K31A0 Gastric intestinal metaplasia, unspecified: Secondary | ICD-10-CM

## 2021-05-23 DIAGNOSIS — K2970 Gastritis, unspecified, without bleeding: Secondary | ICD-10-CM

## 2021-05-23 DIAGNOSIS — K449 Diaphragmatic hernia without obstruction or gangrene: Secondary | ICD-10-CM

## 2021-05-23 DIAGNOSIS — K2950 Unspecified chronic gastritis without bleeding: Secondary | ICD-10-CM | POA: Diagnosis not present

## 2021-05-23 DIAGNOSIS — I10 Essential (primary) hypertension: Secondary | ICD-10-CM | POA: Diagnosis not present

## 2021-05-23 MED ORDER — SODIUM CHLORIDE 0.9 % IV SOLN: 500.0000 mL | Freq: Once | INTRAVENOUS | Status: DC

## 2021-05-23 NOTE — Progress Notes (Signed)
[facility] History and Physical   Primary Care Physician:  [name], MD   Reason for Procedure:   Dysphagia, LUQ pain  Plan:    EGD wit possible dilation     HPI: Kristin Davidson is a 80 y.o. female with a history of AF on Eliquis, here for EGD to evaluate persistent dysphagia and LUQ pain. Eliquis was supposed to be held for 2 days, she states she last took it actually on Sat. On omeprazole twice daily.  Prior barium swallowed showed presbyesophagus. Negative nuclear stress test earlier this year. See clinic note on 04/13/2021 for full details. No changes since her last visit.   Past Medical History:  Diagnosis Date   Allergy    Anxiety    Arthritis    Atrial fibrillation ([place]) 04/2020   Cataract 05/02/2021   only R eye at this time   COPD (chronic obstructive pulmonary disease) ([facility])    Diabetes mellitus without complication ([facility])    GERD (gastroesophageal reflux disease)    Gout    Hypertension    Kidney disease    Lower back pain 05/02/2021   Neuromuscular disorder ([place]) 05/02/2021   lower extremities due to PAD (left worse than right)    Past Surgical History:  Procedure Laterality Date   ABDOMINAL HYSTERECTOMY     ABDOMINAL SURGERY     BREAST EXCISIONAL BIOPSY Right >10+ yrs ago   benign   COLONOSCOPY     KNEE SURGERY      Prior to Admission medications   Medication Sig Start Date End Date Taking? Authorizing Provider  allopurinol (ZYLOPRIM) 300 MG tablet Take 150-300 mg by mouth daily. Alternate from 1/2 and 1 tablet daily 05/07/16  Yes [provider]  amLODipine (NORVASC) 10 MG tablet Take 1 tablet (10 mg total) by mouth daily. 10/04/20  Yes Donato Heinz, MD  apixaban (ELIQUIS) 5 MG TABS tablet Take 1 tablet (5 mg total) by mouth 2 (two) times daily. 12/26/20  Yes Donato Heinz, MD  baclofen (LIORESAL) 10 MG tablet TAKE 1 TABLET(10 MG) BY MOUTH THREE TIMES DAILY AS NEEDED FOR MUSCLE SPASMS 05/17/21  Yes Mcarthur Rossetti, MD  benazepril (LOTENSIN) 20 MG tablet TAKE 1 AND 1/2 TABLETS BY MOUTH ONCE DAILY 01/06/21  Yes Donato Heinz, MD  furosemide (LASIX) 20 MG tablet Take 1 tablet (20 mg total) by mouth daily. 05/09/21 08/07/21 Yes Loel Dubonnet, NP  Homeopathic Products ([place]) FOAM See admin instructions.   Yes [provider]  linaclotide Rolan Lipa) 145 MCG CAPS capsule Take 1 capsule (145 mcg total) by mouth daily before breakfast. 04/13/21  Yes Levin Erp, PA  omeprazole (PRILOSEC) 20 MG capsule Take 2 capsules (40 mg total) by mouth 2 (two) times daily before a meal. 04/13/21  Yes Lemmon, Lavone Nian, PA  albuterol (VENTOLIN HFA) 108 (90 Base) MCG/ACT inhaler Inhale 2 puffs into the lungs every 4 (four) hours as needed. 04/01/21   [provider]  methocarbamol (ROBAXIN) 500 MG tablet Take 1 tablet (500 mg total) by mouth every 6 (six) hours as needed. Patient not taking: No sig reported 02/06/21   Mcarthur Rossetti, MD    Current Outpatient Medications  Medication Sig Dispense Refill   allopurinol (ZYLOPRIM) 300 MG tablet Take 150-300 mg by mouth daily. Alternate from 1/2 and 1 tablet daily  0   amLODipine (NORVASC) 10 MG tablet Take 1 tablet (10 mg total) by mouth daily. 90 tablet 3  apixaban (ELIQUIS) 5 MG TABS tablet Take 1 tablet (5 mg total) by mouth 2 (two) times daily. 180 tablet 1   baclofen (LIORESAL) 10 MG tablet TAKE 1 TABLET(10 MG) BY MOUTH THREE TIMES DAILY AS NEEDED FOR MUSCLE SPASMS 30 tablet 0   benazepril (LOTENSIN) 20 MG tablet TAKE 1 AND 1/2 TABLETS BY MOUTH ONCE DAILY 135 tablet 1   furosemide (LASIX) 20 MG tablet Take 1 tablet (20 mg total) by mouth daily. 30 tablet 2   Homeopathic Products ([place]) FOAM See admin instructions.     linaclotide (LINZESS) 145 MCG CAPS capsule Take 1 capsule (145 mcg total) by mouth daily before breakfast. 30 capsule 11   omeprazole (PRILOSEC) 20 MG capsule Take 2 capsules (40 mg  total) by mouth 2 (two) times daily before a meal. 60 capsule 11   albuterol (VENTOLIN HFA) 108 (90 Base) MCG/ACT inhaler Inhale 2 puffs into the lungs every 4 (four) hours as needed.     methocarbamol (ROBAXIN) 500 MG tablet Take 1 tablet (500 mg total) by mouth every 6 (six) hours as needed. (Patient not taking: No sig reported) 40 tablet 1   Current Facility-Administered Medications  Medication Dose Route Frequency Provider Last Rate Last Admin   0.9 %  sodium chloride infusion  500 mL Intravenous Once Concepcion Gillott, Carlota Raspberry, MD        Allergies as of 05/23/2021 - Review Complete 05/23/2021  Allergen Reaction Noted   Alendronate sodium Other (See Comments) 08/08/2020   Hydromorphone hcl  06/10/2009   Naproxen sodium Itching    Sulfonamide derivatives     Tramadol hcl Other (See Comments)     Family History  Problem Relation Age of Onset   Bone cancer Mother    Heart attack Father 42   Colon cancer Neg Hx    Esophageal cancer Neg Hx    Rectal cancer Neg Hx    Stomach cancer Neg Hx    Colon polyps Neg Hx     Social History   Socioeconomic History   Marital status: Widowed    Spouse name: Not on file   Number of children: Not on file   Years of education: Not on file   Highest education level: Not on file  Occupational History   Not on file  Tobacco Use   Smoking status: Former    Packs/day: 1.00    Years: 59.00    Pack years: 59.00    Types: Cigarettes    Quit date: 06/08/2018    Years since quitting: 2.9    Passive exposure: Past   Smokeless tobacco: Never  Vaping Use   Vaping Use: Never used  Substance and Sexual Activity   Alcohol use: Never   Drug use: Never   Sexual activity: Not on file  Other Topics Concern   Not on file  Social History Narrative   Not on file   Social Determinants of Health   Financial Resource Strain: Not on file  Food Insecurity: Not on file  Transportation Needs: Not on file  Physical Activity: Not on file  Stress: Not on file   Social Connections: Not on file  Intimate Partner Violence: Not on file    Review of Systems: All other review of systems negative except as mentioned in the HPI.  Physical Exam: Vital signs BP 139/71   Pulse 63   Temp 98.4 F (36.9 C)   Ht '5\' 1"'$  (1.549 m)   Wt 172 lb (78 kg)   SpO2  97%   BMI 32.50 kg/m   General:   Alert,  Well-developed, well-nourished, pleasant and cooperative in NAD Lungs:  Clear throughout to auscultation.   Heart:  Regular rate and rhythm;  Abdomen:  Soft, nontender and nondistended. Normal bowel sounds.   Neuro/Psych:  Alert and cooperative. Normal mood and affect. A and O x 3  Jolly Mango, MD [facility]

## 2021-05-23 NOTE — Progress Notes (Signed)
1335 Robinul 0.1 mg IV given due large amount of secretions upon assessment.  MD made aware, vss 

## 2021-05-23 NOTE — Progress Notes (Signed)
Report given to PACU, vss 

## 2021-05-23 NOTE — Progress Notes (Signed)
VS by DT  Pt's states no medical or surgical changes since previsit or office visit.  

## 2021-05-23 NOTE — Progress Notes (Signed)
Called to room to assist during endoscopic procedure.  Patient ID and intended procedure confirmed with present staff. Received instructions for my participation in the procedure from the performing physician.  

## 2021-05-23 NOTE — Patient Instructions (Signed)
YOU HAD AN ENDOSCOPIC PROCEDURE TODAY AT [facility]:   Refer to the procedure report that was given to you for any specific questions about what was found during the examination.  If the procedure report does not answer your questions, please call your gastroenterologist to clarify.  If you requested that your care partner not be given the details of your procedure findings, then the procedure report has been included in a sealed envelope for you to review at your convenience later.  YOU SHOULD EXPECT: Some feelings of bloating in the abdomen. Passage of more gas than usual.  Walking can help get rid of the air that was put into your GI tract during the procedure and reduce the bloating. If you had a lower endoscopy (such as a colonoscopy or flexible sigmoidoscopy) you may notice spotting of blood in your stool or on the toilet paper. If you underwent a bowel prep for your procedure, you may not have a normal bowel movement for a few days.  Please Note:  You might notice some irritation and congestion in your nose or some drainage.  This is from the oxygen used during your procedure.  There is no need for concern and it should clear up in a day or so.  SYMPTOMS TO REPORT IMMEDIATELY:   Following upper endoscopy (EGD)  Vomiting of blood or coffee ground material  New chest pain or pain under the shoulder blades  Painful or persistently difficult swallowing  New shortness of breath  Fever of 100F or higher  Black, tarry-looking stools  For urgent or emergent issues, a gastroenterologist can be reached at any hour by calling [phone]. Do not use MyChart messaging for urgent concerns.    DIET:  We do recommend a small meal at first, but then you may proceed to your regular diet.  Drink plenty of fluids but you should avoid alcoholic beverages for 24 hours.  ACTIVITY:  You should plan to take it easy for the rest of today and you should NOT DRIVE or use heavy machinery  until tomorrow (because of the sedation medicines used during the test).    FOLLOW UP: Our staff will call the number listed on your records 48-72 hours following your procedure to check on you and address any questions or concerns that you may have regarding the information given to you following your procedure. If we do not reach you, we will leave a message.  We will attempt to reach you two times.  During this call, we will ask if you have developed any symptoms of COVID 19. If you develop any symptoms (ie: fever, flu-like symptoms, shortness of breath, cough etc.) before then, please call [phone].  If you test positive for Covid 19 in the 2 weeks post procedure, please call and report this information to [place].    If any biopsies were taken you will be contacted by phone or by letter within the next 1-3 weeks.  Please call us at [phone] if you have not heard about the biopsies in 3 weeks.    SIGNATURES/CONFIDENTIALITY: You and/or your care partner have signed paperwork which will be entered into your electronic medical record.  These signatures attest to the fact that that the information above on your After Visit Summary has been reviewed and is understood.  Full responsibility of the confidentiality of this discharge information lies with you and/or your care-partner.    Resume ELIQUIS  today and remainder of medications. Information given on  Hiatal Hernia.

## 2021-05-23 NOTE — Op Note (Signed)
[facility] Patient Name: Kristin Davidson Procedure Date: 05/23/2021 1:34 PM MRN: [id number] Endoscopist: Remo Lipps P. Havery [name] , MD Age: 80 Referring MD:  Date of Birth: December 31, 1940 Gender: Female Account #: [id number] Procedure:                Upper GI endoscopy Indications:              Upper abdominal pain, Dysphagia Medicines:                Monitored Anesthesia Care Procedure:                Pre-Anesthesia Assessment:                           - Prior to the procedure, a History and Physical                            was performed, and patient medications and                            allergies were reviewed. The patient's tolerance of                            previous anesthesia was also reviewed. The risks                            and benefits of the procedure and the sedation                            options and risks were discussed with the patient.                            All questions were answered, and informed consent                            was obtained. Prior Anticoagulants: The patient has                            taken Eliquis (apixaban), last dose was 2 days                            prior to procedure. ASA Grade Assessment: III - A                            patient with severe systemic disease. After                            reviewing the risks and benefits, the patient was                            deemed in satisfactory condition to undergo the                            procedure.  After obtaining informed consent, the endoscope was                            passed under direct vision. Throughout the                            procedure, the patient's blood pressure, pulse, and                            oxygen saturations were monitored continuously. The                            GIF [id number] was introduced through the                            mouth, and advanced to the second part of duodenum.                             The upper GI endoscopy was accomplished without                            difficulty. The patient tolerated the procedure                            well. Scope In: Scope Out: Findings:                 Esophagogastric landmarks were identified: the                            Z-line was found at 39 cm, the gastroesophageal                            junction was found at 39 cm and the upper extent of                            the gastric folds was found at 40 cm from the                            incisors.                           A 1 cm hiatal hernia was present.                           The exam of the esophagus was otherwise normal. No                            obvious stenosis / stricture appreciated.                           A guidewire was placed and the scope was withdrawn.                            Empiric  dilation was performed in the entire                            esophagus with a Savary dilator with mild                            resistance at 17 mm. Relook endoscopy showed no                            mucosal wrents.                           Two diminutive angiodysplastic lesions with no                            bleeding were found in the gastric antrum.                           The exam of the stomach was otherwise normal.                           Biopsies were taken with a cold forceps in the                            gastric body, at the incisura and in the gastric                            antrum for Helicobacter pylori testing.                           The duodenal bulb and second portion of the                            duodenum were normal. Complications:            No immediate complications. Estimated blood loss:                            Minimal. Estimated Blood Loss:     Estimated blood loss was minimal. Impression:               - Esophagogastric landmarks identified.                           - 1 cm hiatal hernia.                            - Normal esophagus otherwise - empiric dilation                            performed to 59m                           - Two non-bleeding angiodysplastic lesions in the  stomach.                           - Normal stomach otherwise - biopsies taken to rule                            out H pylori                           - Normal duodenal bulb and second portion of the                            duodenum. Recommendation:           - Patient has a contact number available for                            emergencies. The signs and symptoms of potential                            delayed complications were discussed with the                            patient. Return to normal activities tomorrow.                            Written discharge instructions were provided to the                            patient.                           - Resume previous diet.                           - Continue present medications.                           - Can resume Eliquis today                           - Await pathology results and course post dilation [name], MD 05/23/2021 1:54:15 PM This report has been signed electronically.

## 2021-05-25 ENCOUNTER — Ambulatory Visit: Payer: Medicare Other

## 2021-05-25 ENCOUNTER — Telehealth: Payer: Self-pay

## 2021-05-25 NOTE — Telephone Encounter (Signed)
Left message on answering machine. 

## 2021-05-26 LAB — BASIC METABOLIC PANEL
BUN/Creatinine Ratio: 10 — ABNORMAL LOW (ref 12–28)
BUN: 13 mg/dL (ref 8–27)
CO2: 24 mmol/L (ref 20–29)
Calcium: 9.8 mg/dL (ref 8.7–10.3)
Chloride: 101 mmol/L (ref 96–106)
Creatinine, Ser: 1.3 mg/dL — ABNORMAL HIGH (ref 0.57–1.00)
Glucose: 149 mg/dL — ABNORMAL HIGH (ref 65–99)
Potassium: 3.9 mmol/L (ref 3.5–5.2)
Sodium: 144 mmol/L (ref 134–144)
eGFR: 42 mL/min/{1.73_m2} — ABNORMAL LOW (ref >59)

## 2021-05-29 ENCOUNTER — Encounter: Payer: Self-pay | Admitting: Gastroenterology

## 2021-05-29 ENCOUNTER — Telehealth (HOSPITAL_BASED_OUTPATIENT_CLINIC_OR_DEPARTMENT_OTHER): Payer: Self-pay

## 2021-05-29 DIAGNOSIS — R600 Localized edema: Secondary | ICD-10-CM | POA: Diagnosis not present

## 2021-05-29 DIAGNOSIS — I480 Paroxysmal atrial fibrillation: Secondary | ICD-10-CM | POA: Diagnosis not present

## 2021-05-29 DIAGNOSIS — E8770 Fluid overload, unspecified: Secondary | ICD-10-CM | POA: Diagnosis not present

## 2021-05-29 NOTE — Telephone Encounter (Signed)
Spoke with patient and her daughter, provided the following information from C.Walker "Stable kidney function. Normal electrolytes. Good result! Continue current dose of Lasix '20mg'$  daily."  They verbalized understanding.  Confirmed appt for Sept 13, 2022 at 11:30.  Pt would like to speak with [facility] about ongoing edema and pain in her legs.  PCP recommended vascular consult, but the want to speak with Thomas first.

## 2021-06-05 ENCOUNTER — Other Ambulatory Visit: Payer: Self-pay

## 2021-06-05 ENCOUNTER — Other Ambulatory Visit: Payer: Self-pay | Admitting: Orthopaedic Surgery

## 2021-06-08 ENCOUNTER — Ambulatory Visit: Payer: Medicare Other

## 2021-06-20 ENCOUNTER — Other Ambulatory Visit: Payer: Self-pay

## 2021-06-20 ENCOUNTER — Ambulatory Visit (HOSPITAL_BASED_OUTPATIENT_CLINIC_OR_DEPARTMENT_OTHER): Payer: Medicare Other | Admitting: Family

## 2021-06-20 ENCOUNTER — Encounter (HOSPITAL_BASED_OUTPATIENT_CLINIC_OR_DEPARTMENT_OTHER): Payer: Self-pay | Admitting: Family

## 2021-06-20 VITALS — BP 128/64 | HR 55 | Ht 61.0 in | Wt 174.2 lb

## 2021-06-20 DIAGNOSIS — R600 Localized edema: Secondary | ICD-10-CM | POA: Diagnosis not present

## 2021-06-20 DIAGNOSIS — I5189 Other ill-defined heart diseases: Secondary | ICD-10-CM

## 2021-06-20 DIAGNOSIS — I10 Essential (primary) hypertension: Secondary | ICD-10-CM | POA: Diagnosis not present

## 2021-06-20 DIAGNOSIS — I739 Peripheral vascular disease, unspecified: Secondary | ICD-10-CM | POA: Diagnosis not present

## 2021-06-20 DIAGNOSIS — Z7901 Long term (current) use of anticoagulants: Secondary | ICD-10-CM | POA: Diagnosis not present

## 2021-06-20 DIAGNOSIS — I480 Paroxysmal atrial fibrillation: Secondary | ICD-10-CM

## 2021-06-20 MED ORDER — FUROSEMIDE 20 MG PO TABS: ORAL_TABLET | ORAL | 5 refills | Status: DC

## 2021-06-20 NOTE — Patient Instructions (Addendum)
Medication Instructions:  Your physician has recommended you make the following change in your medication:   CHANGE Furosemide to '40mg'$  on Tuesdays and Saturdays and '20mg'$  every other days   *If you need a refill on your cardiac medications before your next appointment, please call your pharmacy*  Lab Work: Your physician recommends that you return for lab work in 1-2 weeks for BMP, BNP.   Testing/Procedures: None ordered today.   Follow-Up: At [facility], you and your health needs are our priority.  As part of our continuing mission to provide you with exceptional heart care, we have created designated Provider Care Teams.  These Care Teams include your primary Cardiologist (physician) and Advanced Practice Providers (APPs -  Physician Assistants and Nurse Practitioners) who all work together to provide you with the care you need, when you need it.  We recommend signing up for the patient portal called "MyChart".  Sign up information is provided on this After Visit Summary.  MyChart is used to connect with patients for Virtual Visits (Telemedicine).  Patients are able to view lab/test results, encounter notes, upcoming appointments, etc.  Non-urgent messages can be sent to your provider as well.   To learn more about what you can do with MyChart, go to [email or web address].    Your next appointment:   4 month(s)  The format for your next appointment:   In Person  Provider:   You may see Donato Heinz, MD or one of the following Advanced Practice Providers on your designated Care Team:   Rosaria Ferries, PA-C Caron Presume, PA-C Jory Sims, DNP, ANP   Other Instructions  To prevent or reduce lower extremity swelling: Eat a low salt diet. Salt makes the body hold onto extra fluid which causes swelling. Sit with legs elevated. For example, in the recliner or on an [place].  Wear knee-high compression stockings during the daytime. Ones labeled 15-20 mmHg  provide good compression.   Heart Healthy Diet Recommendations: A low-salt diet is recommended. Meats should be grilled, baked, or boiled. Avoid fried foods. Focus on lean protein sources like fish or chicken with vegetables and fruits. The American Heart Association is a Microbiologist!    Exercise recommendations: The American Heart Association recommends 150 minutes of moderate intensity exercise weekly. Try 30 minutes of moderate intensity exercise 4-5 times per week. This could include walking, jogging, or swimming.  Peripheral Edema Peripheral edema is swelling that is caused by a buildup of fluid. Peripheral edema most often affects the lower legs, ankles, and feet. It can also develop in the arms, hands, and face. The area of the body that has peripheral edema will look swollen. It may also feel heavy or warm. Your clothes may start to feel tight. Pressing on the area may make a temporary dent in your skin. You may not be able to move your swollen arm or leg as much as usual. There are many causes of peripheral edema. It can happen because of a complication of other conditions such as congestive heart failure, kidney disease, or a problem with your blood circulation. It also can be a side effect of certain medicines or because of an infection. It often happens to women during pregnancy. Sometimes, the cause is not known. Follow these instructions at home: Managing pain, stiffness, and swelling  Raise (elevate) your legs while you are sitting or lying down. Move around often to prevent stiffness and to lessen swelling. Do not sit or stand for long periods of time.  Wear support stockings as told by your health care provider. Medicines Take over-the-counter and prescription medicines only as told by your health care provider. Your health care provider may prescribe medicine to help your body get rid of excess water (diuretic). General instructions Pay attention to any changes in your  symptoms. Follow instructions from your health care provider about limiting salt (sodium) in your diet. Sometimes, eating less salt may reduce swelling. Moisturize skin daily to help prevent skin from cracking and draining. Keep all follow-up visits as told by your health care provider. This is important. Contact a health care provider if you have: A fever. Edema that starts suddenly or is getting worse, especially if you are pregnant or have a medical condition. Swelling in only one leg. Increased swelling, redness, or pain in one or both of your legs. Drainage or sores at the area where you have edema. Get help right away if you: Develop shortness of breath, especially when you are lying down. Have pain in your chest or abdomen. Feel weak. Feel faint. Summary Peripheral edema is swelling that is caused by a buildup of fluid. Peripheral edema most often affects the lower legs, ankles, and feet. Move around often to prevent stiffness and to lessen swelling. Do not sit or stand for long periods of time. Pay attention to any changes in your symptoms. Contact a health care provider if you have edema that starts suddenly or is getting worse, especially if you are pregnant or have a medical condition. Get help right away if you develop shortness of breath, especially when lying down. This information is not intended to replace advice given to you by your health care provider. Make sure you discuss any questions you have with your health care provider. Document Revised: 06/18/2018 Document Reviewed: 06/18/2018 Elsevier Patient Education  2022 [employer].

## 2021-06-20 NOTE — Progress Notes (Signed)
Office Visit    Patient Name: Kristin Davidson Date of Encounter: 06/20/2021  PCP:  [name], [facility]  Cardiologist:  Donato Heinz, MD  Advanced Practice Provider:  No care team member to display Electrophysiologist:  None   Chief Complaint    Kristin Davidson is a 80 y.o. female with a hx of HTN, COPD, CKD, atrial fibrillation, RBBB presents today for follow-up after addition of Lasix.  Past Medical History    Past Medical History:  Diagnosis Date   Allergy    Anxiety    Arthritis    Atrial fibrillation ([place]) 04/2020   Cataract 05/02/2021   only R eye at this time   COPD (chronic obstructive pulmonary disease) (HCC)    Diabetes mellitus without complication (HCC)    GERD (gastroesophageal reflux disease)    Gout    Hypertension    Kidney disease    Lower back pain 05/02/2021   Neuromuscular disorder ([place]) 05/02/2021   lower extremities due to PAD (left worse than right)   Past Surgical History:  Procedure Laterality Date   ABDOMINAL HYSTERECTOMY     ABDOMINAL SURGERY     BREAST EXCISIONAL BIOPSY Right >10+ yrs ago   benign   COLONOSCOPY     KNEE SURGERY      Allergies  Allergies  Allergen Reactions   Alendronate Sodium Other (See Comments)   Hydromorphone Hcl     REACTION: itching   Naproxen Sodium Itching   Sulfonamide Derivatives     Dizziness   Tramadol Hcl Other (See Comments)    Bloated and constipation     History of Present Illness    Kristin Davidson is a 80 y.o. female with a hx of HTN, COPD, CKD, atrial fibrillation, RBBB last seen 05/09/2021  Previous admission 05/08/20 with chest pain with diagnosis of colitis. HS troponin 20>29>24>22>23>16. Echo with normal LVEF 60-65%, mild LVH, gr2DD, normal RV function, mild to moderate MR. Esophagitis was presumed cause of her chest pain. She had bradycardia with as low as 40s which was asymptomatic. She was discharged with cardiac event monitor showing 3%  atrial fibrillation burden with average HR 130bpm and 2318 episodes of SVT, longest 53 sec. She was started on Eliquis '5mg'$  BID CHADS2VASC of 4.  She was seen by Dr. Gardiner Rhyme 10/04/20 doing overall well from a cardiac perspective. She reported abdominal and chest pain which resolved when she ate. She was recommended for sleep study. Given elevated BP her Amlodipine was increased to '10mg'$  daily. Lexiscan myoview was ordered which was low risk study.   Seen in follow-up with her daughter 05/09/2021.  Clearance was provided for EGD for continued work-up of GI issues.  Given lower extremity edema and hypertension Lasix was initiated.  She was following a low-salt diet and drinking less than 2 L.  She reported no dyspnea nor chest pain.  She presents today for follow-up with her daughter. Tells me her swelling in her legs has been about the same.  It is worse at the end of the day and better in the morning.  She does elevate her legs when laying down but not when sitting.  Does not want to wear compression socks.  Her daughter is concerned about ABI which was performed January 2022 showing resting and postexercise ABI with normal limits.  Right with no significant arterial occlusive disease.  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  Interested in seeing  vascular provider.  Does note her primary care increased her Lasix for 3 days with improvement in her edema but has since returned.  EKGs/Labs/Other Studies Reviewed:   The following studies were reviewed today:  Lexiscan myoview 11/01/20 There was no ST segment deviation noted during stress. The left ventricular ejection fraction is normal (55-65%). Nuclear stress EF: 64%. Defect 1: There is a small defect of mild severity present in the basal inferior, mid inferior and apical inferior location. Defect 2: There is a small defect of mild severity present in the basal anteroseptal, mid anteroseptal and apical septal location. The study is  normal. This is a low risk study.   1. Fixed inferior and anteroseptal perfusion defects with normal wall motion in these regions, suggesting artifact 2. Low risk study  EKG:  No EKG is ordered today.  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   allopurinol (ZYLOPRIM) 100 MG tablet Take 200 mg by mouth daily.   amLODipine (NORVASC) 10 MG tablet Take 1 tablet (10 mg total) by mouth daily.   apixaban (ELIQUIS) 5 MG TABS tablet Take 1 tablet (5 mg total) by mouth 2 (two) times daily.   baclofen (LIORESAL) 10 MG tablet TAKE 1 TABLET(10 MG) BY MOUTH THREE TIMES DAILY AS NEEDED FOR MUSCLE SPASMS   benazepril (LOTENSIN) 20 MG tablet TAKE 1 AND 1/2 TABLETS BY MOUTH ONCE DAILY   furosemide (LASIX) 20 MG tablet Take 1 tablet (20 mg total) by mouth daily.   Homeopathic Products ([place]) FOAM See admin instructions.   linaclotide (LINZESS) 145 MCG CAPS capsule Take 1 capsule (145 mcg total) by mouth daily before breakfast.   omeprazole (PRILOSEC) 20 MG capsule Take 2 capsules (40 mg total) by mouth 2 (two) times daily before a meal.   [DISCONTINUED] allopurinol (ZYLOPRIM) 300 MG tablet Take 150-300 mg by mouth daily. Alternate from 1/2 and 1 tablet daily     Review of Systems      All other systems reviewed and are otherwise negative except as noted above.  Physical Exam    VS:  BP 128/64   Pulse (!) 55   Ht '5\' 1"'$  (1.549 m)   Wt 174 lb 3.2 oz (79 kg)   SpO2 95%    BMI 32.91 kg/m  , BMI Body mass index is 32.91 kg/m.  Wt Readings from Last 3 Encounters:  06/20/21 174 lb 3.2 oz (79 kg)  05/23/21 172 lb (78 kg)  05/09/21 171 lb (77.6 kg)   GEN: Well nourished, well developed, in no acute distress. HEENT: normal. Neck: Supple, no JVD, carotid bruits, or masses. Cardiac: RRR, no murmurs, rubs, or gallops. No clubbing, cyanosis, edema.  Radials/PT 2+ and equal bilaterally.  Respiratory:  Respirations regular and unlabored, clear to auscultation bilaterally. GI: Soft, nontender, nondistended. MS: No deformity or atrophy. Skin: Warm and dry, no rash. Neuro:  Strength and sensation are intact. Psych:  Normal affect.  Assessment & Plan     LE edema / diastolic dysfunction  -Since addition of Lasix tells me her lower extremity edema is about the same.  Did notice improvement with 3-day course of double dose by primary care.  Will increase Lasix to 40 mg on Tuesdays and Saturdays and she will take 20 mg every other day.  Repeat BMP and BNP in 1 to 2 weeks for monitoring.  Educated to elevate lower extremities.  She is not interested in wearing compression socks.  PAD -reports bilateral leg pain with sensation of cramping.  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  Will refer to Dr. Gwenlyn Found for evaluation.  Her daughter sees Dr. Gwenlyn Found and prefers to establish with him.  Chest pain - No recurrence. Lexiscan 09/2020 no ischemia. No indication for further evaluation.   HTN - BP well controlled. Continue current antihypertensive regimen.    PAF / Chronic anticoagulation - Maintaining NSR by auscultation. Echo 05/09/20 with LVEF 60-65%, gr2DD, normal RV function, mild to moderate MR. Continue Eliquis '5mg'$  BID.  Has bleeding complications no AV nodal blocking agent due to previous bradycardia.   Disposition: Follow up in 4 months with Dr. Gardiner Rhyme or APP.  Signed, Loel Dubonnet, NP 06/20/2021, 11:35 AM [place]

## 2021-06-21 ENCOUNTER — Other Ambulatory Visit: Payer: Self-pay | Admitting: Orthopaedic Surgery

## 2021-06-21 NOTE — Telephone Encounter (Signed)
ok 

## 2021-06-28 ENCOUNTER — Ambulatory Visit: Payer: Medicare Other | Admitting: Podiatry

## 2021-06-29 ENCOUNTER — Ambulatory Visit (HOSPITAL_BASED_OUTPATIENT_CLINIC_OR_DEPARTMENT_OTHER): Payer: Medicare Other | Admitting: Family

## 2021-07-03 ENCOUNTER — Other Ambulatory Visit: Payer: Self-pay | Admitting: Cardiology

## 2021-07-06 ENCOUNTER — Other Ambulatory Visit: Payer: Self-pay | Admitting: Orthopaedic Surgery

## 2021-07-10 DIAGNOSIS — R600 Localized edema: Secondary | ICD-10-CM | POA: Diagnosis not present

## 2021-07-11 LAB — BASIC METABOLIC PANEL
BUN/Creatinine Ratio: 13 (ref 12–28)
BUN: 15 mg/dL (ref 8–27)
CO2: 25 mmol/L (ref 20–29)
Calcium: 9.6 mg/dL (ref 8.7–10.3)
Chloride: 101 mmol/L (ref 96–106)
Creatinine, Ser: 1.12 mg/dL — ABNORMAL HIGH (ref 0.57–1.00)
Glucose: 134 mg/dL — ABNORMAL HIGH (ref 70–99)
Potassium: 3.8 mmol/L (ref 3.5–5.2)
Sodium: 143 mmol/L (ref 134–144)
eGFR: 50 mL/min/{1.73_m2} — ABNORMAL LOW (ref >59)

## 2021-07-11 LAB — BRAIN NATRIURETIC PEPTIDE: BNP: 116.2 pg/mL — ABNORMAL HIGH (ref 0.0–100.0)

## 2021-07-17 ENCOUNTER — Ambulatory Visit
Admission: RE | Admit: 2021-07-17 | Discharge: 2021-07-17 | Disposition: A | Discharge status: Home / Self Care | Payer: Medicare Other | Source: Ambulatory Visit | Attending: Internal Medicine | Admitting: Internal Medicine

## 2021-07-17 ENCOUNTER — Other Ambulatory Visit: Payer: Self-pay

## 2021-07-17 DIAGNOSIS — Z1231 Encounter for screening mammogram for malignant neoplasm of breast: Secondary | ICD-10-CM

## 2021-07-19 ENCOUNTER — Encounter: Payer: Self-pay | Admitting: Cardiovascular Disease

## 2021-07-19 ENCOUNTER — Ambulatory Visit: Payer: Medicare Other | Admitting: Cardiovascular Disease

## 2021-07-19 ENCOUNTER — Other Ambulatory Visit: Payer: Self-pay

## 2021-07-19 ENCOUNTER — Ambulatory Visit: Payer: Medicare Other

## 2021-07-19 DIAGNOSIS — I739 Peripheral vascular disease, unspecified: Secondary | ICD-10-CM

## 2021-07-19 HISTORY — DX: Peripheral vascular disease, unspecified: I73.9

## 2021-07-19 NOTE — Assessment & Plan Note (Signed)
Kristin Davidson was referred to me by Laurann Montana, PA-C for evaluation of PAD.  She says she has pain both at rest and with ambulation.  She does have a 2+ pedal pulse on exam.  Outpatient rest and exercise ABIs performed 10/13/2020 were entirely normal.  I do not think that her symptoms are vascular in nature.

## 2021-07-19 NOTE — Patient Instructions (Signed)

## 2021-07-19 NOTE — Progress Notes (Signed)
07/19/2021 Kristin Davidson   [birth year]  [id number]  Primary Physician [name], MD Primary Cardiologist: Lorretta Harp MD Garret [facility]dish, [place], [place]  HPI:  Kristin Davidson is a 80 y.o. moderately overweight widowed African-American female mother of 3 children, grandmother of 3 grandchildren who is accompanied by one of her daughters Taline Nass who is also a patient of mine.  She was referred by Laurann Montana, NP for evaluation of PAD.  She does have a history of treated hypertension as well as A. fib on Eliquis oral anticoagulation.  She also has dependent lower extremity edema.  She is on amlodipine and diuretics.  She is have pain at rest and with ambulation for about 6 months.  She had rest exercise ABIs performed 10/13/2020 which were entirely normal.   Current Meds  Medication Sig   albuterol (VENTOLIN HFA) 108 (90 Base) MCG/ACT inhaler Inhale 2 puffs into the lungs every 4 (four) hours as needed.   allopurinol (ZYLOPRIM) 100 MG tablet Take 200 mg by mouth daily.   amLODipine (NORVASC) 10 MG tablet Take 1 tablet (10 mg total) by mouth daily.   apixaban (ELIQUIS) 5 MG TABS tablet Take 1 tablet (5 mg total) by mouth 2 (two) times daily.   baclofen (LIORESAL) 10 MG tablet TAKE 1 TABLET(10 MG) BY MOUTH THREE TIMES DAILY AS NEEDED FOR MUSCLE SPASMS   benazepril (LOTENSIN) 20 MG tablet TAKE 1 AND 1/2 TABLETS BY MOUTH EVERY DAY   furosemide (LASIX) 20 MG tablet Take 2 tablets (40mg ) on Tuesdays and Saturday. All other days take 1 tablet (20mg ).   Homeopathic Products ([place]) FOAM See admin instructions.   linaclotide (LINZESS) 145 MCG CAPS capsule Take 1 capsule (145 mcg total) by mouth daily before breakfast.   omeprazole (PRILOSEC) 20 MG capsule Take 2 capsules (40 mg total) by mouth 2 (two) times daily before a meal.     Allergies  Allergen Reactions   Alendronate Sodium Other (See Comments)   Hydromorphone Hcl     REACTION: itching   Naproxen Sodium Itching    Sulfonamide Derivatives     Dizziness   Tramadol Hcl Other (See Comments)    Bloated and constipation     Social History   Socioeconomic History   Marital status: Widowed    Spouse name: Not on file   Number of children: Not on file   Years of education: Not on file   Highest education level: Not on file  Occupational History   Not on file  Tobacco Use   Smoking status: Former    Packs/day: 1.00    Years: 59.00    Pack years: 59.00    Types: Cigarettes    Quit date: 06/08/2018    Years since quitting: 3.1    Passive exposure: Past   Smokeless tobacco: Never  Vaping Use   Vaping Use: Never used  Substance and Sexual Activity   Alcohol use: Never   Drug use: Never   Sexual activity: Not on file  Other Topics Concern   Not on file  Social History Narrative   Not on file   Social Determinants of Health   Financial Resource Strain: Not on file  Food Insecurity: Not on file  Transportation Needs: Not on file  Physical Activity: Not on file  Stress: Not on file  Social Connections: Not on file  Intimate Partner Violence: Not on file     Review of Systems: General: negative for chills, fever, night  sweats or weight changes.  Cardiovascular: negative for chest pain, dyspnea on exertion, edema, orthopnea, palpitations, paroxysmal nocturnal dyspnea or shortness of breath Dermatological: negative for rash Respiratory: negative for cough or wheezing Urologic: negative for hematuria Abdominal: negative for nausea, vomiting, diarrhea, bright red blood per rectum, melena, or hematemesis Neurologic: negative for visual changes, syncope, or dizziness All other systems reviewed and are otherwise negative except as noted above.    Blood pressure (!) 151/73, pulse 73, height 5\' 1"  (1.549 m), weight 174 lb 3.2 oz (79 kg), SpO2 95 %.  General appearance: alert and no distress Neck: no adenopathy, no carotid bruit, no JVD, supple, symmetrical, trachea midline, and thyroid not  enlarged, symmetric, no tenderness/mass/nodules Lungs: clear to auscultation bilaterally Heart: irregularly irregular rhythm Extremities: extremities normal, atraumatic, no cyanosis or edema Pulses: 2+ and symmetric Skin: Skin color, texture, turgor normal. 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  I personally reviewed this EKG.  ASSESSMENT AND PLAN:   Peripheral arterial disease ([place]) Ms. Oravec was referred to me by Laurann Montana, PA-C for evaluation of PAD.  She says she has pain both at rest and with ambulation.  She does have a 2+ pedal pulse on exam.  Outpatient rest and exercise ABIs performed 10/13/2020 were entirely normal.  I do not think that her symptoms are vascular in nature.     Lorretta Harp MD FACP,FACC,FAHA, [facility] 07/19/2021 12:29 PM

## 2021-07-22 ENCOUNTER — Other Ambulatory Visit: Payer: Self-pay | Admitting: Orthopaedic Surgery

## 2021-07-22 ENCOUNTER — Other Ambulatory Visit: Payer: Self-pay | Admitting: Cardiology

## 2021-07-24 NOTE — Telephone Encounter (Signed)
Prescription refill request for Eliquis received. Indication:Afib Last office visit:10/22 Scr:1.1 Age: 80 Weight:79  kg  Prescription refilled

## 2021-08-03 DIAGNOSIS — M810 Age-related osteoporosis without current pathological fracture: Secondary | ICD-10-CM | POA: Diagnosis not present

## 2021-08-03 DIAGNOSIS — M545 Low back pain, unspecified: Secondary | ICD-10-CM | POA: Diagnosis not present

## 2021-08-03 DIAGNOSIS — E1169 Type 2 diabetes mellitus with other specified complication: Secondary | ICD-10-CM | POA: Diagnosis not present

## 2021-08-03 DIAGNOSIS — I480 Paroxysmal atrial fibrillation: Secondary | ICD-10-CM | POA: Diagnosis not present

## 2021-08-03 DIAGNOSIS — I10 Essential (primary) hypertension: Secondary | ICD-10-CM | POA: Diagnosis not present

## 2021-08-03 DIAGNOSIS — J449 Chronic obstructive pulmonary disease, unspecified: Secondary | ICD-10-CM | POA: Diagnosis not present

## 2021-08-08 ENCOUNTER — Ambulatory Visit (INDEPENDENT_AMBULATORY_CARE_PROVIDER_SITE_OTHER): Payer: Medicare Other | Admitting: Podiatry

## 2021-08-08 ENCOUNTER — Other Ambulatory Visit: Payer: Self-pay

## 2021-08-08 ENCOUNTER — Encounter: Payer: Self-pay | Admitting: Podiatry

## 2021-08-08 ENCOUNTER — Other Ambulatory Visit: Payer: Self-pay | Admitting: Orthopaedic Surgery

## 2021-08-08 DIAGNOSIS — M79675 Pain in left toe(s): Secondary | ICD-10-CM | POA: Diagnosis not present

## 2021-08-08 DIAGNOSIS — M2012 Hallux valgus (acquired), left foot: Secondary | ICD-10-CM

## 2021-08-08 DIAGNOSIS — B351 Tinea unguium: Secondary | ICD-10-CM | POA: Diagnosis not present

## 2021-08-08 DIAGNOSIS — N1831 Chronic kidney disease, stage 3a: Secondary | ICD-10-CM

## 2021-08-08 DIAGNOSIS — M2011 Hallux valgus (acquired), right foot: Secondary | ICD-10-CM

## 2021-08-08 DIAGNOSIS — D689 Coagulation defect, unspecified: Secondary | ICD-10-CM

## 2021-08-08 DIAGNOSIS — M79674 Pain in right toe(s): Secondary | ICD-10-CM

## 2021-08-08 HISTORY — DX: Coagulation defect, unspecified: D68.9

## 2021-08-08 NOTE — Progress Notes (Signed)
This patient returns to my office for at risk foot care.  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  She says she has PAD.  This patient is unable to cut nails herself since the patient cannot reach her nails.These nails are painful walking and wearing shoes.  This patient presents for at risk foot care today.  General Appearance  Alert, conversant and in no acute stress.  Vascular  Dorsalis pedis and posterior tibial  pulses are palpable  bilaterally.  Capillary return is within normal limits  bilaterally. Temperature is within normal limits  bilaterally. Absent digital hair.  Neurologic  Senn-Weinstein monofilament wire test within normal limits  bilaterally. Muscle power within normal limits bilaterally.  Nails Thick disfigured discolored nails with subungual debris  from hallux to fifth toes bilaterally. No evidence of bacterial infection or drainage bilaterally.  Orthopedic  No limitations of motion  feet .  No crepitus or effusions noted.  No bony pathology or digital deformities noted.  HAV  B/L.  Skin  normotropic skin with no porokeratosis noted bilaterally.  No signs of infections or ulcers noted.     Onychomycosis  Pain in right toes  Pain in left toes  Consent was obtained for treatment procedures.   Mechanical debridement of nails 1-5  bilaterally performed with a nail nipper.  Filed with dremel without incident.    Return office visit    4 months                  Told patient to return for periodic foot care and evaluation due to potential at risk complications.   Gardiner Barefoot DPM

## 2021-08-23 ENCOUNTER — Other Ambulatory Visit: Payer: Self-pay | Admitting: Orthopaedic Surgery

## 2021-09-05 DIAGNOSIS — E119 Type 2 diabetes mellitus without complications: Secondary | ICD-10-CM | POA: Diagnosis not present

## 2021-09-11 ENCOUNTER — Other Ambulatory Visit: Payer: Self-pay | Admitting: Orthopaedic Surgery

## 2021-09-22 ENCOUNTER — Other Ambulatory Visit: Payer: Self-pay | Admitting: Orthopaedic Surgery

## 2021-09-22 ENCOUNTER — Other Ambulatory Visit: Payer: Self-pay | Admitting: Cardiology

## 2021-09-23 IMAGING — CT CT ABD-PELV W/ CM
2 of 5 series · 16 of 46 positions shown, 18 images · IV contrast (omnipaque)
Comparison: CT abdomen dated 08/24/2018.

CLINICAL DATA: Abdominal pain

EXAM:
CT ABDOMEN AND PELVIS WITH CONTRAST
TECHNIQUE: Multidetector CT imaging of the abdomen and pelvis was performed
using the standard protocol following bolus administration of
intravenous contrast.
CONTRAST:  80mL OMNIPAQUE IOHEXOL 300 MG/ML  SOLN

[Series 3: a/p w/ 5mm · axial · 0.82mm/px · z∈[+849,+1309]mm · 13 of 104 slices shown, 15 images]
[im 6/104  soft-tissue]
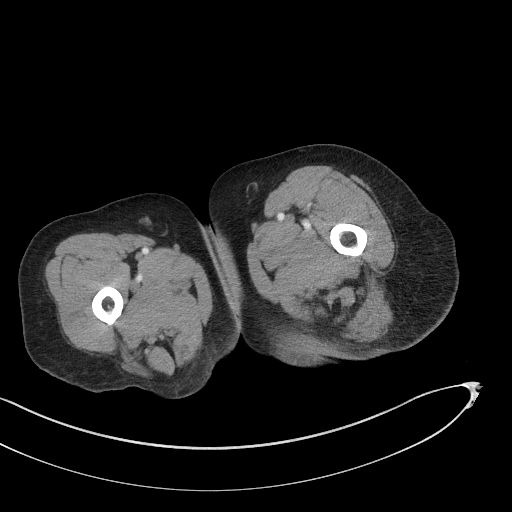
[im 6/104  bone]
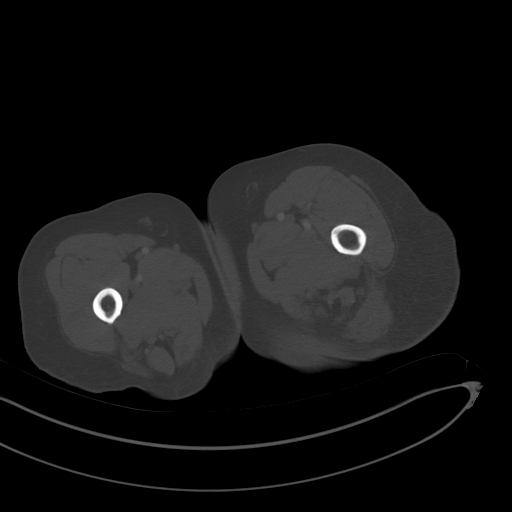
[im 17/104  soft-tissue]
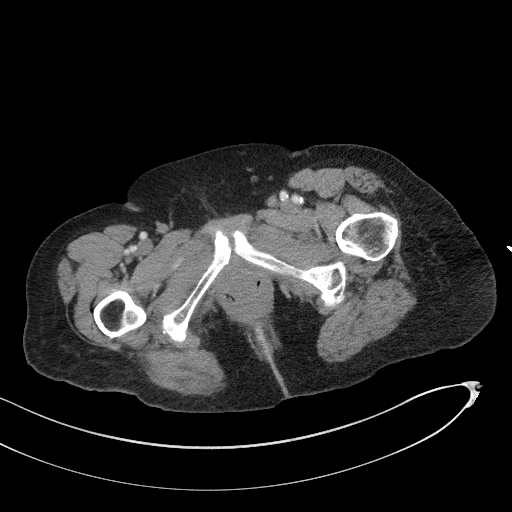
[im 22/104  soft-tissue]
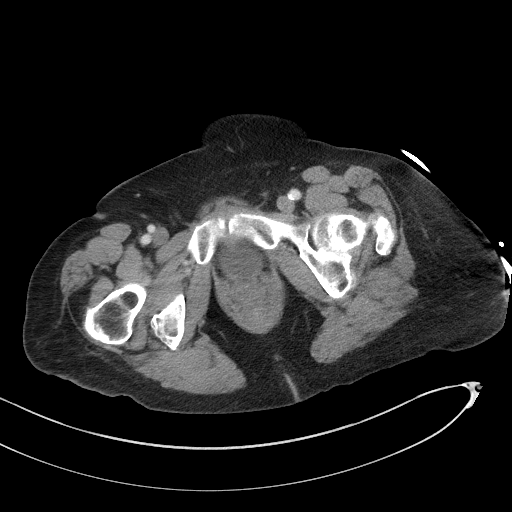
[im 28/104  soft-tissue]
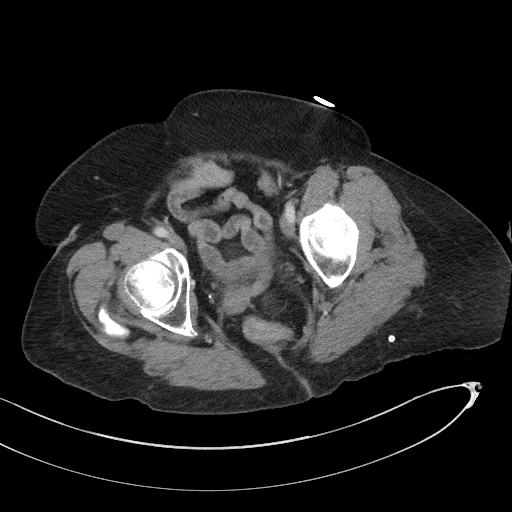
[im 38/104  soft-tissue]
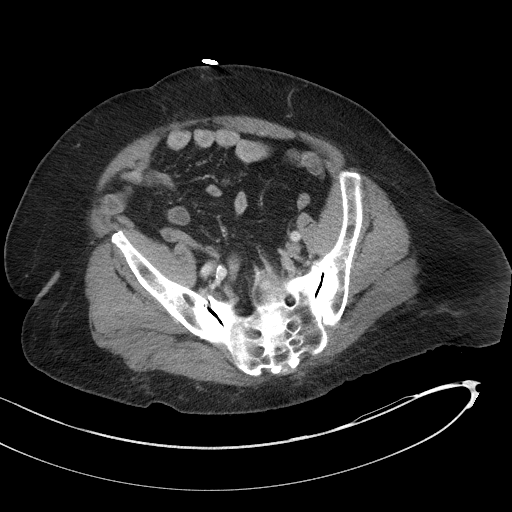
[im 44/104  soft-tissue]
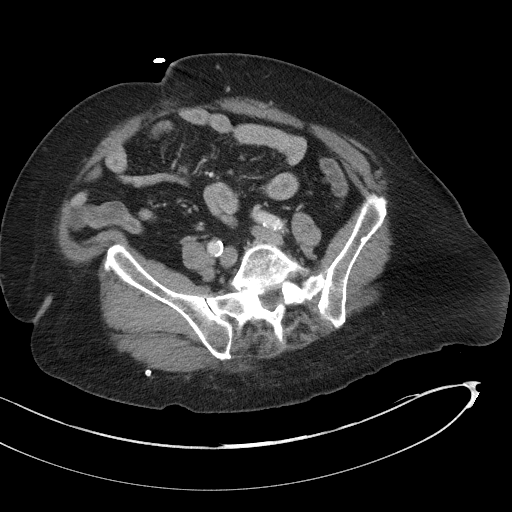
[im 55/104  soft-tissue]
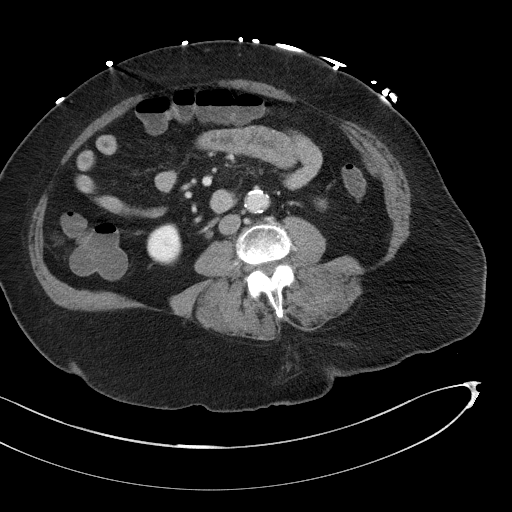
[im 60/104  soft-tissue]
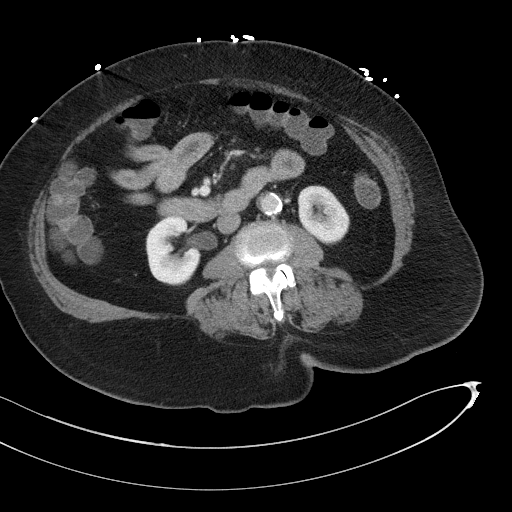
[im 66/104  soft-tissue]
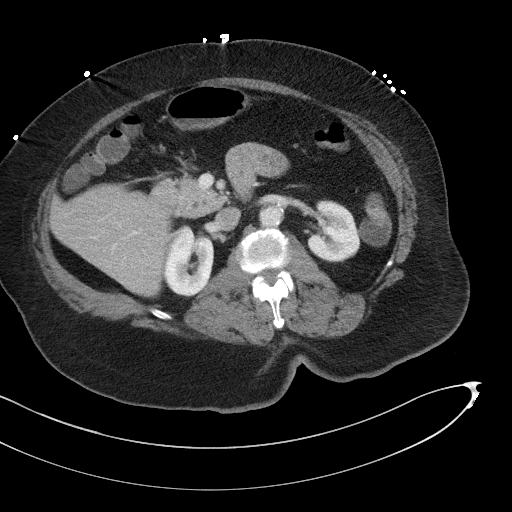
[im 66/104  bone]
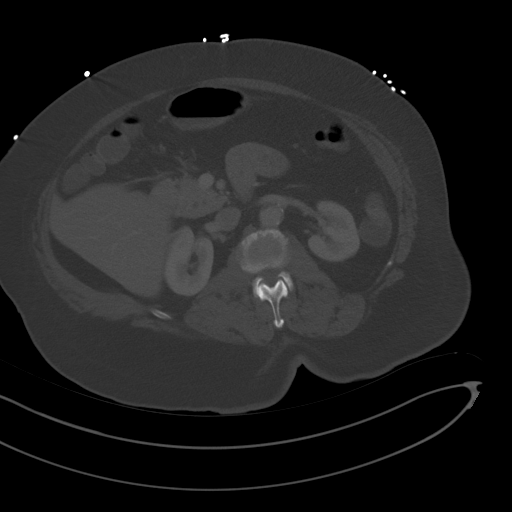
[im 76/104  soft-tissue]
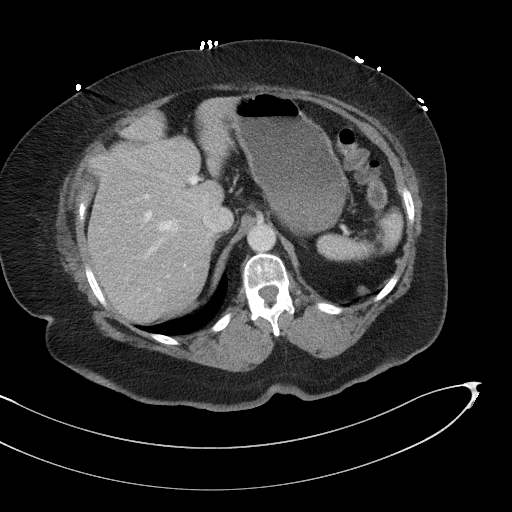
[im 82/104  soft-tissue]
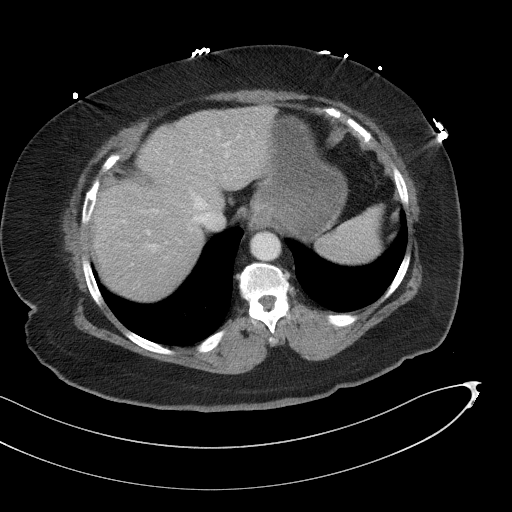
[im 87/104  soft-tissue]
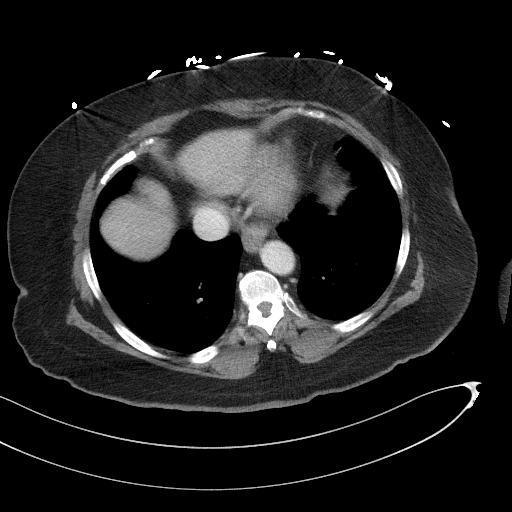
[im 98/104  soft-tissue]
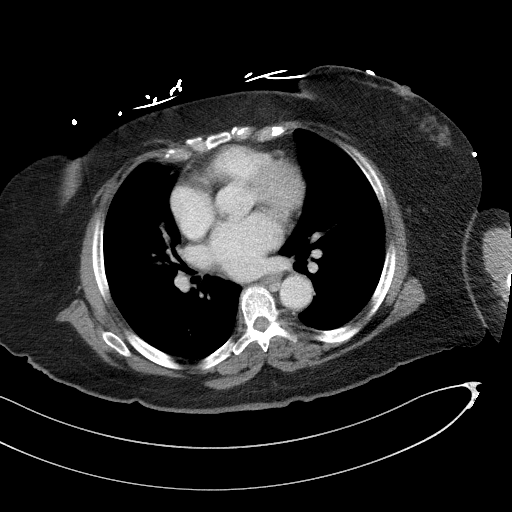

[Series 6: a/p w/ cor · coronal · 1.01mm/px · 3 of 213 slices shown]
[im 71/213  soft-tissue]
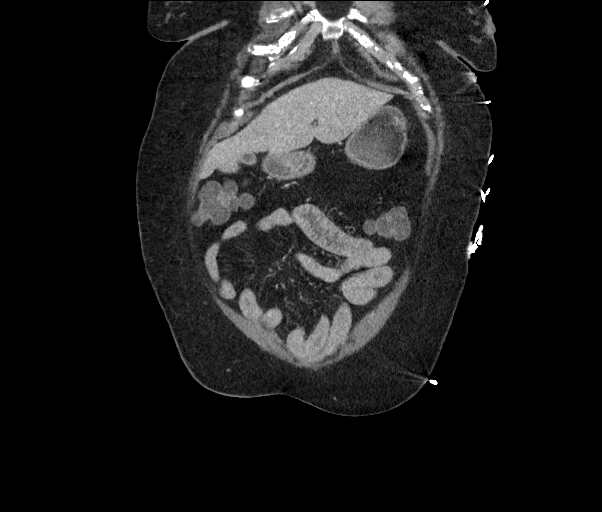
[im 95/213  soft-tissue]
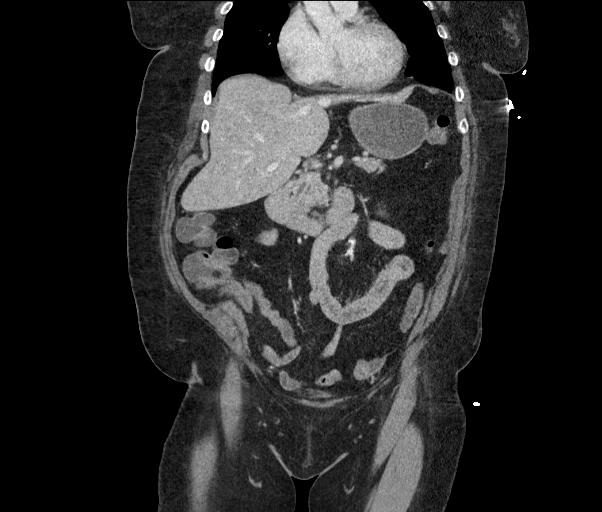
[im 118/213  soft-tissue]
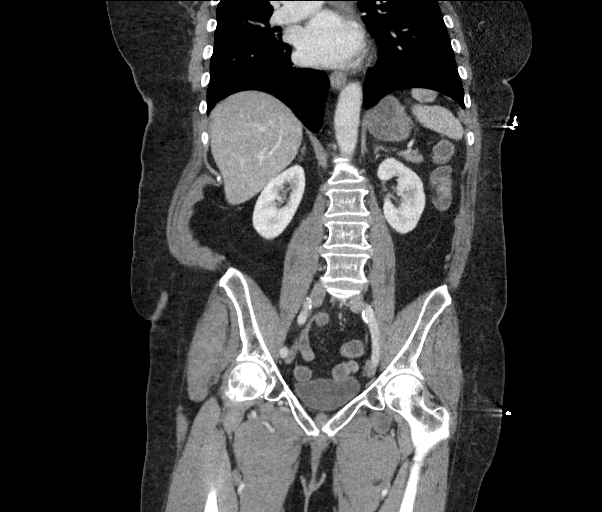

[16 of 46 positions shown; findings below may reference images not displayed]

FINDINGS: Lower chest: No acute abnormality.

Hepatobiliary: Liver is diffusely low in density suggesting fatty
infiltration. No focal liver abnormality. Gallbladder is contracted
but otherwise unremarkable. No bile duct dilatation seen.

Pancreas: Unremarkable. No pancreatic ductal dilatation or
surrounding inflammatory changes.

Spleen: Normal in size without focal abnormality.

Adrenals/Urinary Tract: Adrenal glands appear normal. Kidneys are
unremarkable without mass, stone or hydronephrosis. No ureteral or
bladder calculi identified. Bladder appears normal, partially
decompressed.

Stomach/Bowel: No dilated large or small bowel loops. No evidence of
bowel wall inflammation. Fluid is seen throughout the large bowel
which can be a secondary sign of underlying gastroenteritis or
colitis. Appendix is not seen but there are no inflammatory changes
about the cecum to suggest acute appendicitis.

Vascular/Lymphatic: Aortic atherosclerosis. No acute appearing
vascular abnormality. No enlarged lymph nodes seen.

Reproductive: Presumed hysterectomy.  No adnexal mass or free fluid.

Other: No free fluid or abscess collection seen. No free
intraperitoneal air.

Musculoskeletal: No acute or suspicious osseous finding.
Degenerative spondylosis of the lumbar spine, mild to moderate in
degree.
IMPRESSION: 1. Fluid is seen throughout the large bowel which can be a secondary
sign of underlying gastroenteritis or colitis. However, no bowel
wall thickening or mesenteric inflammation to confirm an enteritis
or colitis. No bowel obstruction.
2. Fatty infiltration of the liver.
3. No other acute or significant findings within the abdomen or
pelvis.

Aortic Atherosclerosis ([id number]-0F0.0).

## 2021-10-05 ENCOUNTER — Other Ambulatory Visit: Payer: Self-pay | Admitting: Orthopaedic Surgery

## 2021-10-05 DIAGNOSIS — I480 Paroxysmal atrial fibrillation: Secondary | ICD-10-CM | POA: Diagnosis not present

## 2021-10-05 DIAGNOSIS — I10 Essential (primary) hypertension: Secondary | ICD-10-CM | POA: Diagnosis not present

## 2021-10-05 DIAGNOSIS — E7800 Pure hypercholesterolemia, unspecified: Secondary | ICD-10-CM | POA: Diagnosis not present

## 2021-10-05 DIAGNOSIS — N1831 Chronic kidney disease, stage 3a: Secondary | ICD-10-CM | POA: Diagnosis not present

## 2021-10-05 DIAGNOSIS — M810 Age-related osteoporosis without current pathological fracture: Secondary | ICD-10-CM | POA: Diagnosis not present

## 2021-10-05 DIAGNOSIS — E1169 Type 2 diabetes mellitus with other specified complication: Secondary | ICD-10-CM | POA: Diagnosis not present

## 2021-10-05 DIAGNOSIS — J449 Chronic obstructive pulmonary disease, unspecified: Secondary | ICD-10-CM | POA: Diagnosis not present

## 2021-10-23 ENCOUNTER — Other Ambulatory Visit: Payer: Self-pay

## 2021-10-23 ENCOUNTER — Other Ambulatory Visit: Payer: Self-pay | Admitting: Orthopaedic Surgery

## 2021-10-23 ENCOUNTER — Ambulatory Visit (INDEPENDENT_AMBULATORY_CARE_PROVIDER_SITE_OTHER): Payer: Medicare Other | Admitting: Orthopaedic Surgery

## 2021-10-23 DIAGNOSIS — M5442 Lumbago with sciatica, left side: Secondary | ICD-10-CM

## 2021-10-23 DIAGNOSIS — M5441 Lumbago with sciatica, right side: Secondary | ICD-10-CM | POA: Diagnosis not present

## 2021-10-23 DIAGNOSIS — M4807 Spinal stenosis, lumbosacral region: Secondary | ICD-10-CM

## 2021-10-23 NOTE — Progress Notes (Signed)
The patient is an 81 year old female well-known to [place].  She has been dealing with chronic back pain for some time now.  I have had her on baclofen as a muscle relaxant.  She has had several interventions by Dr. Ernestina Patches as well.  A MRI of the lumbar spine that we obtained in April 2022 showed moderate canal and severe bilateral subarticular recess stenosis at L4-L5 and moderate bilateral foraminal stenosis at that level as well.  She still has significant low back pain.  The injections by Dr. Ernestina Patches of only lasted about 2-1/2 months.  She does not want any more injections as of now.  Her daughter is also with her.  They are requesting at least an appointment with a spine specialist to see if there is anything else that can be offered other than chronic pain management and they would also like a referral to chronic pain management which I agree with.  She does show me that her pain seems to be in the lower lumbar spine both the right and left but it does radiate down her right leg into her right foot.  She has a positive straight leg raise on the right side but no weakness it seems.  She is able to get up to a standing position from a chair easily.  We will work on a chronic pain management referral.  Fortunately she is not on any narcotic medications.  We will also have her get an appointment with Dr. Lorin Mercy to assess her spine as well.

## 2021-11-04 ENCOUNTER — Other Ambulatory Visit: Payer: Self-pay | Admitting: Orthopaedic Surgery

## 2021-11-07 ENCOUNTER — Ambulatory Visit (INDEPENDENT_AMBULATORY_CARE_PROVIDER_SITE_OTHER): Payer: Medicare Other | Admitting: Orthopaedic Surgery

## 2021-11-07 ENCOUNTER — Ambulatory Visit (INDEPENDENT_AMBULATORY_CARE_PROVIDER_SITE_OTHER): Payer: Medicare Other | Admitting: Podiatry

## 2021-11-07 ENCOUNTER — Encounter: Payer: Self-pay | Admitting: Podiatry

## 2021-11-07 ENCOUNTER — Encounter: Payer: Self-pay | Admitting: Orthopaedic Surgery

## 2021-11-07 ENCOUNTER — Other Ambulatory Visit: Payer: Self-pay

## 2021-11-07 DIAGNOSIS — L600 Ingrowing nail: Secondary | ICD-10-CM | POA: Diagnosis not present

## 2021-11-07 DIAGNOSIS — D689 Coagulation defect, unspecified: Secondary | ICD-10-CM | POA: Diagnosis not present

## 2021-11-07 DIAGNOSIS — M48062 Spinal stenosis, lumbar region with neurogenic claudication: Secondary | ICD-10-CM

## 2021-11-07 DIAGNOSIS — M48061 Spinal stenosis, lumbar region without neurogenic claudication: Secondary | ICD-10-CM | POA: Insufficient documentation

## 2021-11-07 DIAGNOSIS — M79675 Pain in left toe(s): Secondary | ICD-10-CM | POA: Diagnosis not present

## 2021-11-07 DIAGNOSIS — B351 Tinea unguium: Secondary | ICD-10-CM | POA: Diagnosis not present

## 2021-11-07 DIAGNOSIS — M79674 Pain in right toe(s): Secondary | ICD-10-CM | POA: Diagnosis not present

## 2021-11-07 DIAGNOSIS — I739 Peripheral vascular disease, unspecified: Secondary | ICD-10-CM

## 2021-11-07 HISTORY — DX: Spinal stenosis, lumbar region without neurogenic claudication: M48.061

## 2021-11-07 NOTE — Progress Notes (Signed)
Office Visit Note   Patient: Kristin Davidson           Date of Birth: January 19, 1941           MRN: [id number] Visit Date: 11/07/2021              Requested by: [name], MD [address] [place],  [place] [address] PCP: [name], MD   Assessment & Plan: Visit Diagnoses:  1. Spinal stenosis of lumbar region with neurogenic claudication     Plan: Patient states she does not want another injection she states she also does not want to have surgery due to her age.  We reviewed the MRI scan and gave her copy of the report.  She understands the pathophysiology of the condition with L4-5 stenosis.  She changes her mind about surgery injection she can call or return.  She will try to walk max distance until she gets claudication sit rest and then get up and repeat to help work on losing a little bit of weight and help overall fitness, blood pressure, cardiac etc.  Her daughter was with her today and we discussed indications for returning if she had increased symptoms or would like further treatment.  Follow-Up Instructions: No follow-ups on file.   Orders:  No orders of the defined types were placed in this encounter.  No orders of the defined types were placed in this encounter.     Procedures: No procedures performed   Clinical Data: No additional findings.   Subjective: Chief Complaint  Patient presents with   Lower Back - Pain    HPI 81 year old female was seen she has diagnosis of lumbar spinal stenosis with neurogenic claudication.  She is here with her daughter and she has been living with her daughter for last 5 years.  Last 2 years she been walking much slower.  MRI was obtained which showed L4-5 stenosis moderately severe with severe biforaminal stenosis.  She had epidurals which lasted 2 and half months gave her good relief but then she had recurrence of symptoms exactly like before.  She has been on therapy did not really think it helps.  She is  taken some Neurontin plus baclofen.  She has a little bit more right than left leg symptoms.  When she has increased pain with standing or prolonged walking she gets relief when she sits.  Review of Systems all other systems updated noncontributory to HPI.   Objective: Vital Signs: BP (!) 156/84    Pulse 74    Ht 5\' 1"  (1.549 m)    Wt 176 lb 12.8 oz (80.2 kg)    BMI 33.41 kg/m   Physical Exam Constitutional:      Appearance: She is well-developed.  HENT:     Head: Normocephalic.     Right Ear: External ear normal.     Left Ear: External ear normal. There is no impacted cerumen.  Eyes:     Pupils: Pupils are equal, round, and reactive to light.  Neck:     Thyroid: No thyromegaly.     Trachea: No tracheal deviation.  Cardiovascular:     Rate and Rhythm: Normal rate.  Pulmonary:     Effort: Pulmonary effort is normal.  Abdominal:     Palpations: Abdomen is soft.  Musculoskeletal:     Cervical back: No rigidity.  Skin:    General: Skin is warm and dry.  Neurological:     Mental Status: She is alert  and oriented to person, place, and time.  Psychiatric:        Behavior: Behavior normal.    Ortho Exam patient has negative straight leg raising negative logroll she has short stride gait slow and deliberate negative Trendelenburg.  She has some increased fat over the sacrum she has me to look at which is subcutaneous and between the gluteus on each side and slightly heart-shaped.  No sacral tenderness.  No sciatic notch tenderness minimal trochanteric bursal tenderness negative logroll the hips.  Specialty Comments:  No specialty comments available.  Imaging: No results found.   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   Peripheral arterial disease ([place]) 07/19/2021   Abnormal mammogram 11/09/2020   Chronic kidney disease, stage 3a ([place]) 11/09/2020   Chronic  obstructive pulmonary disease, unspecified ([place]) 73/41/9379   Diastolic dysfunction 02/40/9735   Excessive thirst 11/09/2020   Gout 11/09/2020   Paroxysmal atrial fibrillation ([place]) 05/19/2020   Secondary hypercoagulable state ([place]) 05/19/2020   Atypical chest pain 05/08/2020   Colitis    Nausea and vomiting in adult    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   Essential hypertension 08/06/2007   ALLERGIC RHINITIS 08/06/2007   GERD 08/06/2007   BREAST CYST 08/06/2007   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   Past Medical History:  Diagnosis Date   Allergy    Anxiety    Arthritis    Atrial fibrillation ([place]) 04/2020   Cataract 05/02/2021   only R eye at this time   COPD (chronic obstructive pulmonary disease) ([place])    Diabetes mellitus without complication ([facility])    GERD (gastroesophageal reflux disease)    Gout    Hypertension    Kidney disease    Lower back pain 05/02/2021   Neuromuscular disorder ([place]) 05/02/2021   lower extremities due to PAD (left worse than right)    Family History  Problem Relation Age of Onset   Bone cancer Mother    Heart attack Father 40   Colon cancer Neg Hx    Esophageal cancer Neg Hx    Rectal cancer Neg Hx    Stomach cancer Neg Hx    Colon polyps Neg Hx     Past Surgical History:  Procedure Laterality Date   ABDOMINAL HYSTERECTOMY     ABDOMINAL SURGERY     BREAST EXCISIONAL BIOPSY Right >10+ yrs ago   benign   COLONOSCOPY     KNEE SURGERY     Social History   Occupational History   Not on file  Tobacco Use   Smoking status: Former    Packs/day: 1.00    Years: 59.00    Pack years: 59.00    Types: Cigarettes    Quit date: 06/08/2018    Years since quitting: 3.4    Passive exposure: Past   Smokeless tobacco: Never  Vaping Use   Vaping Use: Never used  Substance and Sexual Activity   Alcohol use: Never   Drug use: Never   Sexual activity: Not on file

## 2021-11-07 NOTE — Progress Notes (Signed)
This patient returns to my office for at risk foot care.  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  She says she has PAD and also borderline diabetic.   This patient is unable to cut nails herself since the patient cannot reach her nails.These nails are painful walking and wearing shoes.  This patient presents for at risk foot care today.  General Appearance  Alert, conversant and in no acute stress.  Vascular  Dorsalis pedis and posterior tibial  pulses are palpable  bilaterally.  Capillary return is within normal limits  bilaterally. Temperature is within normal limits  bilaterally. Absent digital hair.  Neurologic  Senn-Weinstein monofilament wire test within normal limits  bilaterally. Muscle power within normal limits bilaterally.  Nails Thick disfigured discolored nails with subungual debris  from hallux to fifth toes bilaterally. No evidence of bacterial infection or drainage bilaterally.  Orthopedic  No limitations of motion  feet .  No crepitus or effusions noted.  No bony pathology or digital deformities noted.  HAV  B/L.  Skin  normotropic skin with no porokeratosis noted bilaterally.  No signs of infections or ulcers noted.     Onychomycosis  Pain in right toes  Pain in left toes  Consent was obtained for treatment procedures.   Mechanical debridement of nails 1-5  bilaterally performed with a nail nipper.  Filed with dremel without incident.    Return office visit    3 months                  Told patient to return for periodic foot care and evaluation due to potential at risk complications.   Lorenda Peck DPM

## 2021-11-23 ENCOUNTER — Other Ambulatory Visit: Payer: Self-pay | Admitting: Orthopaedic Surgery

## 2021-12-06 ENCOUNTER — Ambulatory Visit: Payer: Medicare Other | Admitting: Podiatry

## 2021-12-07 ENCOUNTER — Other Ambulatory Visit: Payer: Self-pay | Admitting: Orthopaedic Surgery

## 2021-12-25 ENCOUNTER — Other Ambulatory Visit: Payer: Self-pay | Admitting: Orthopaedic Surgery

## 2021-12-27 ENCOUNTER — Ambulatory Visit: Payer: Medicare Other | Admitting: Cardiology

## 2021-12-29 DIAGNOSIS — E1169 Type 2 diabetes mellitus with other specified complication: Secondary | ICD-10-CM | POA: Diagnosis not present

## 2021-12-29 DIAGNOSIS — I10 Essential (primary) hypertension: Secondary | ICD-10-CM | POA: Diagnosis not present

## 2021-12-29 DIAGNOSIS — E7800 Pure hypercholesterolemia, unspecified: Secondary | ICD-10-CM | POA: Diagnosis not present

## 2022-01-09 NOTE — Progress Notes (Addendum)
? ?Office Visit  ?  ?Patient Name: Kristin Davidson ?Date of Encounter: 01/10/2022 ? ?PCP:  [name], MD ?  ?[place]  ?Cardiologist:  Donato Heinz, MD  ?Advanced Practice Provider:  No care team member to display ?Electrophysiologist:  None  ? ?Chief Complaint  ?  ?Kristin Davidson is a 81 y.o. female with a hx of HTN, COPD, CKD, atrial fibrillation, RBBB presents today for follow-up after of HTN ? ?Past Medical History  ?  ?Past Medical History:  ?Diagnosis Date  ? Allergy   ? Anxiety   ? Arthritis   ? Atrial fibrillation ([place]) 04/2020  ? Cataract 05/02/2021  ? only R eye at this time  ? COPD (chronic obstructive pulmonary disease) ([place])   ? Diabetes mellitus without complication ([place])   ? GERD (gastroesophageal reflux disease)   ? Gout   ? Hypertension   ? Kidney disease   ? Lower back pain 05/02/2021  ? Neuromuscular disorder ([place]) 05/02/2021  ? 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 ?Past Surgical History:  ?Procedure Laterality Date  ? ABDOMINAL HYSTERECTOMY    ? ABDOMINAL SURGERY    ? BREAST EXCISIONAL BIOPSY Right >10+ yrs ago  ? benign  ? COLONOSCOPY    ? KNEE SURGERY    ? ? ?Allergies ? ?Allergies  ?Allergen Reactions  ? Alendronate Sodium Other (See Comments)  ? Hydromorphone Hcl   ?  REACTION: itching  ? Naproxen Sodium Itching  ? Sulfonamide Derivatives   ?  Dizziness  ? Tramadol Hcl Other (See Comments)  ?  Bloated and constipation   ? ? ?History of Present Illness  ?  ?Kristin Davidson is a 81 y.o. female with a hx of HTN, COPD, CKD, atrial fibrillation, RBBB last seen 07/2021 by Dr. Gwenlyn Found ? ?Previous admission 05/08/20 with chest pain with diagnosis of colitis. HS troponin 20>29>24>22>23>16. Echo with normal LVEF 60-65%, mild LVH, gr2DD, normal RV function, mild to moderate MR. Esophagitis was presumed cause of her chest pain. She had bradycardia with as low as 40s which was asymptomatic. She was discharged with cardiac event monitor showing 3%  atrial fibrillation burden with average HR 130bpm and 2318 episodes of SVT, longest 53 sec. She was started on Eliquis '5mg'$  BID CHADS2VASC of 4. ? ?She was seen by Dr. Gardiner Rhyme 10/04/20 doing overall well from a cardiac perspective. She reported abdominal and chest pain which resolved when she ate. She was recommended for sleep study. Given elevated BP her Amlodipine was increased to '10mg'$  daily. Lexiscan myoview was ordered which was low risk study.  ? ?Seen in follow-up with her daughter 05/09/2021.  Clearance was provided for EGD for continued work-up of GI issues.  Given lower extremity edema and hypertension Lasix was initiated.  She was following a low-salt diet and drinking less than 2 L.  She reported no dyspnea nor chest pain.She was seen by Dr. Gwenlyn Found for workup of PAD which showed no PAD.  ? ?She presents today for follow up with her daughter. Follows with Dr. Inda Merlin and Dr. Frances Maywood and has a spot in her back that is nearly bone on bone. Options for injection vs surgery. She has started walking regimen to hopefully improve symptoms. Does note when she is in significant pain will have occasional palpitation that self resolves after a few seconds. Reports no shortness of breath nor dyspnea on exertion. Reports no chest pain, pressure, or tightness. No edema, orthopnea, PND. Reports  no palpitations.  ? ?EKGs/Labs/Other Studies Reviewed:  ? ?The following studies were reviewed today: ? ?Lexiscan myoview 11/01/20 ?There was no ST segment deviation noted during stress. ?The left ventricular ejection fraction is normal (55-65%). ?Nuclear stress EF: 64%. ?Defect 1: There is a small defect of mild severity present in the basal inferior, mid inferior and apical inferior location. ?Defect 2: There is a small defect of mild severity present in the basal anteroseptal, mid anteroseptal and apical septal location. ?The study is normal. ?This is a low risk study. ?  ?1. Fixed inferior and anteroseptal perfusion defects with  normal wall motion in these regions, suggesting artifact ?2. Low risk study ? ?EKG:  EKG is ordered today.  EKG performed today demonstrates junctional rhythm 69 bpm with stable right bundle branch block and no acute ST/T wave changes. ? ?Recent Labs: ?07/10/2021: BNP 116.2; BUN 15; Creatinine, Ser 1.12; Potassium 3.8; Sodium 143  ?Recent Lipid Panel ?   ?Component Value Date/Time  ? CHOL 125 05/08/2020 0805  ? TRIG 90 05/08/2020 0805  ? HDL 38 (L) 05/08/2020 0805  ? CHOLHDL 3.3 05/08/2020 0805  ? VLDL 18 05/08/2020 0805  ? [place] 69 05/08/2020 0805  ? ?Home Medications  ? ?Current Meds  ?Medication Sig  ? albuterol (VENTOLIN HFA) 108 (90 Base) MCG/ACT inhaler Inhale 2 puffs into the lungs every 4 (four) hours as needed.  ? allopurinol (ZYLOPRIM) 100 MG tablet Take 200 mg by mouth daily.  ? amLODipine (NORVASC) 10 MG tablet TAKE 1 TABLET(10 MG) BY MOUTH DAILY  ? baclofen (LIORESAL) 10 MG tablet TAKE 1 TABLET(10 MG) BY MOUTH THREE TIMES DAILY AS NEEDED FOR MUSCLE SPASMS  ? benazepril (LOTENSIN) 20 MG tablet TAKE 1 AND 1/2 TABLETS BY MOUTH EVERY DAY  ? ELIQUIS 5 MG TABS tablet TAKE 1 TABLET(5 MG) BY MOUTH TWICE DAILY  ? furosemide (LASIX) 20 MG tablet Take 2 tablets ('40mg'$ ) on Tuesdays and Saturday. All other days take 1 tablet ('20mg'$ ).  ? gabapentin (NEURONTIN) 100 MG capsule Take 200 mg by mouth daily.  ? Homeopathic Products ([place]) FOAM See admin instructions.  ? linaclotide (LINZESS) 145 MCG CAPS capsule Take 1 capsule (145 mcg total) by mouth daily before breakfast.  ? omeprazole (PRILOSEC) 20 MG capsule Take 2 capsules (40 mg total) by mouth 2 (two) times daily before a meal.  ?  ? ?Review of Systems  ?    ?All other systems reviewed and are otherwise negative except as noted above. ? ?Physical Exam  ?  ?VS:  BP 112/68 (BP Location: Left Arm, Patient Position: Sitting, Cuff Size: Normal)   Pulse 69   Ht '5\' 1"'$  (1.549 m)   Wt 176 lb 6.4 oz (80 kg)   BMI 33.33 kg/m?  , BMI Body mass index is 33.33  kg/m?. ? ?Wt Readings from Last 3 Encounters:  ?01/10/22 176 lb 6.4 oz (80 kg)  ?11/07/21 176 lb 12.8 oz (80.2 kg)  ?07/19/21 174 lb 3.2 oz (79 kg)  ? ?GEN: Well nourished, overweight, well developed, in no acute distress. ?HEENT: normal. ?Neck: Supple, no JVD, carotid bruits, or masses. ?Cardiac: RRR, no murmurs, rubs, or gallops. No clubbing, cyanosis, edema.  Radials/PT 2+ and equal bilaterally.  ?Respiratory:  Respirations regular and unlabored, clear to auscultation bilaterally. ?GI: Soft, nontender, nondistended. ?MS: No deformity or atrophy. ?Skin: Warm and dry, no rash. ?Neuro:  Strength and sensation are intact. ?Psych: Normal affect. ? ?Assessment & Plan  ?  ?LE edema / diastolic dysfunction  -  Continue Lasix to 40 mg on Tuesdays and Saturdays and 20 mg every other day.  Educated to elevate lower extremities.  She is not interested in wearing compression socks.  Encouraged to follow low-sodium diet.  No noted dyspnea and swelling has overall been well controlled. ? ?Leg pain - Evaluated by Dr. Gwenlyn Found with no evidence of PAD. Further evaluation per primary care.  Likely related to her back problems which are followed by Dr. Inda Merlin and Dr. Ninfa Linden. ? ?Chest pain - No recurrence. Lexiscan 09/2020 no ischemia. No indication for further evaluation.  ? ?HTN - BP well controlled. Continue current antihypertensive regimen.   ? ?Palpations / PAF / Chronic anticoagulation - Maintaining NSR by auscultation. Echo 05/09/20 with LVEF 60-65%, gr2DD, normal RV function, mild to moderate MR. Continue Eliquis '5mg'$  BID.  Has no bleeding complications no AV nodal blocking agent due to previous bradycardia.  She does endorse occasional palpitation which sounds like PVC triggered by pain.  Discussed as needed propanolol but she prefers to manage with deep breathing is reports they are overall not bothersome. ? ?Disposition: Follow up in 6 months with Dr. Gardiner Rhyme or APP. ? ?Signed, ?Loel Dubonnet, NP ?01/10/2022, 3:52 PM ?[place] ? ?

## 2022-01-10 ENCOUNTER — Other Ambulatory Visit: Payer: Self-pay | Admitting: Physician Assistant

## 2022-01-10 ENCOUNTER — Encounter (HOSPITAL_BASED_OUTPATIENT_CLINIC_OR_DEPARTMENT_OTHER): Payer: Self-pay | Admitting: Family

## 2022-01-10 ENCOUNTER — Ambulatory Visit (HOSPITAL_BASED_OUTPATIENT_CLINIC_OR_DEPARTMENT_OTHER): Payer: Medicare Other | Admitting: Family

## 2022-01-10 VITALS — BP 112/68 | HR 69 | Ht 61.0 in | Wt 176.4 lb

## 2022-01-10 DIAGNOSIS — R600 Localized edema: Secondary | ICD-10-CM

## 2022-01-10 DIAGNOSIS — I5189 Other ill-defined heart diseases: Secondary | ICD-10-CM

## 2022-01-10 DIAGNOSIS — I10 Essential (primary) hypertension: Secondary | ICD-10-CM | POA: Diagnosis not present

## 2022-01-10 DIAGNOSIS — I480 Paroxysmal atrial fibrillation: Secondary | ICD-10-CM

## 2022-01-10 DIAGNOSIS — D6859 Other primary thrombophilia: Secondary | ICD-10-CM | POA: Diagnosis not present

## 2022-01-10 DIAGNOSIS — R002 Palpitations: Secondary | ICD-10-CM

## 2022-01-10 NOTE — Patient Instructions (Addendum)
Medication Instructions:  ?Continue your current medications.  ? ?*If you need a refill on your cardiac medications before your next appointment, please call your pharmacy* ? ?Lab Work: ?None ordered today.  ? ?Testing/Procedures: ?Your EKG today showed normal sinus rhythm which is a good result.  ? ?Follow-Up: ?At [facility], you and your health needs are our priority.  As part of our continuing mission to provide you with exceptional heart care, we have created designated Provider Care Teams.  These Care Teams include your primary Cardiologist (physician) and Advanced Practice Providers (APPs -  Physician Assistants and Nurse Practitioners) who all work together to provide you with the care you need, when you need it. ? ?We recommend signing up for the patient portal called "MyChart".  Sign up information is provided on this After Visit Summary.  MyChart is used to connect with patients for Virtual Visits (Telemedicine).  Patients are able to view lab/test results, encounter notes, upcoming appointments, etc.  Non-urgent messages can be sent to your provider as well.   ?To learn more about what you can do with MyChart, go to [email or web address].   ? ?Your next appointment:   ?6 month(s) ? ?The format for your next appointment:   ?In Person ? ?Provider:   ?Donato Heinz, MD or Advanced Practice Provider  ? ? ?Other Instructions ? ?To prevent palpitations: ?Make sure you are adequately hydrated.  ?Avoid and/or limit caffeine containing beverages like soda or tea. ?Exercise regularly.  ?Manage stress well. ?Some over the counter medications can cause palpitations such as Benadryl, AdvilPM, TylenolPM. Regular Advil or Tylenol do not cause palpitations.   ? ?Heart Healthy Diet Recommendations: ?A low-salt diet is recommended. Meats should be grilled, baked, or boiled. Avoid fried foods. Focus on lean protein sources like fish or chicken with vegetables and fruits. The American Heart Association is a  Microbiologist!  American Heart Association Diet and Lifeystyle Recommendations   ? ?Exercise recommendations: ?The American Heart Association recommends 150 minutes of moderate intensity exercise weekly. ?Try 30 minutes of moderate intensity exercise 4-5 times per week. ?This could include walking, jogging, or swimming. ?  ?

## 2022-01-25 ENCOUNTER — Other Ambulatory Visit: Payer: Self-pay | Admitting: Cardiology

## 2022-01-25 ENCOUNTER — Other Ambulatory Visit: Payer: Self-pay | Admitting: Physician Assistant

## 2022-01-25 NOTE — Telephone Encounter (Signed)
Prescription refill request for Eliquis received. ?Indication:Afib ?Last office visit:4/23 ?Scr:1.1 ?Age: 81 ?Weight:80 kg ? ?Prescription refilled ? ?

## 2022-02-02 DIAGNOSIS — E1169 Type 2 diabetes mellitus with other specified complication: Secondary | ICD-10-CM | POA: Diagnosis not present

## 2022-02-02 DIAGNOSIS — Z0000 Encounter for general adult medical examination without abnormal findings: Secondary | ICD-10-CM | POA: Diagnosis not present

## 2022-02-02 DIAGNOSIS — I480 Paroxysmal atrial fibrillation: Secondary | ICD-10-CM | POA: Diagnosis not present

## 2022-02-02 DIAGNOSIS — Z1389 Encounter for screening for other disorder: Secondary | ICD-10-CM | POA: Diagnosis not present

## 2022-02-02 DIAGNOSIS — N1831 Chronic kidney disease, stage 3a: Secondary | ICD-10-CM | POA: Diagnosis not present

## 2022-02-02 DIAGNOSIS — I10 Essential (primary) hypertension: Secondary | ICD-10-CM | POA: Diagnosis not present

## 2022-02-05 ENCOUNTER — Ambulatory Visit: Payer: Medicare Other | Admitting: Podiatry

## 2022-02-05 ENCOUNTER — Encounter: Payer: Self-pay | Admitting: Podiatry

## 2022-02-05 DIAGNOSIS — B351 Tinea unguium: Secondary | ICD-10-CM | POA: Diagnosis not present

## 2022-02-05 DIAGNOSIS — D689 Coagulation defect, unspecified: Secondary | ICD-10-CM | POA: Diagnosis not present

## 2022-02-05 DIAGNOSIS — I739 Peripheral vascular disease, unspecified: Secondary | ICD-10-CM

## 2022-02-05 DIAGNOSIS — M2011 Hallux valgus (acquired), right foot: Secondary | ICD-10-CM

## 2022-02-05 DIAGNOSIS — N1831 Chronic kidney disease, stage 3a: Secondary | ICD-10-CM | POA: Diagnosis not present

## 2022-02-05 DIAGNOSIS — M79674 Pain in right toe(s): Secondary | ICD-10-CM | POA: Diagnosis not present

## 2022-02-05 DIAGNOSIS — M2012 Hallux valgus (acquired), left foot: Secondary | ICD-10-CM | POA: Diagnosis not present

## 2022-02-05 DIAGNOSIS — M79675 Pain in left toe(s): Secondary | ICD-10-CM | POA: Diagnosis not present

## 2022-02-05 NOTE — Progress Notes (Signed)
This patient returns to my office for at risk foot care.  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  She says she has PAD.  This patient is unable to cut nails herself since the patient cannot reach her nails.These nails are painful walking and wearing shoes.  This patient presents for at risk foot care today.  General Appearance  Alert, conversant and in no acute stress.  Vascular  Dorsalis pedis and posterior tibial  pulses are palpable  bilaterally.  Capillary return is within normal limits  bilaterally. Temperature is within normal limits  bilaterally. Absent digital hair.  Neurologic  Senn-Weinstein monofilament wire test within normal limits  bilaterally. Muscle power within normal limits bilaterally.  Nails Thick disfigured discolored nails with subungual debris  from hallux to fifth toes bilaterally. No evidence of bacterial infection or drainage bilaterally.  Orthopedic  No limitations of motion  feet .  No crepitus or effusions noted.  No bony pathology or digital deformities noted.  HAV  B/L.  Skin  normotropic skin with no porokeratosis noted bilaterally.  No signs of infections or ulcers noted.     Onychomycosis  Pain in right toes  Pain in left toes  Consent was obtained for treatment procedures.   Mechanical debridement of nails 1-5  bilaterally performed with a nail nipper.  Filed with dremel without incident.    Return office visit    3 months                  Told patient to return for periodic foot care and evaluation due to potential at risk complications.   Jisel Fleet DPM   

## 2022-02-08 ENCOUNTER — Other Ambulatory Visit: Payer: Self-pay | Admitting: Physician Assistant

## 2022-02-23 ENCOUNTER — Other Ambulatory Visit: Payer: Self-pay | Admitting: Physician Assistant

## 2022-02-23 ENCOUNTER — Other Ambulatory Visit (HOSPITAL_BASED_OUTPATIENT_CLINIC_OR_DEPARTMENT_OTHER): Payer: Self-pay | Admitting: Family

## 2022-02-23 DIAGNOSIS — I5189 Other ill-defined heart diseases: Secondary | ICD-10-CM

## 2022-02-23 NOTE — Telephone Encounter (Signed)
Rx(s) sent to pharmacy electronically.  

## 2022-02-27 DIAGNOSIS — I10 Essential (primary) hypertension: Secondary | ICD-10-CM | POA: Diagnosis not present

## 2022-02-27 DIAGNOSIS — I480 Paroxysmal atrial fibrillation: Secondary | ICD-10-CM | POA: Diagnosis not present

## 2022-02-27 DIAGNOSIS — N1831 Chronic kidney disease, stage 3a: Secondary | ICD-10-CM | POA: Diagnosis not present

## 2022-02-27 DIAGNOSIS — E7800 Pure hypercholesterolemia, unspecified: Secondary | ICD-10-CM | POA: Diagnosis not present

## 2022-02-27 DIAGNOSIS — E1169 Type 2 diabetes mellitus with other specified complication: Secondary | ICD-10-CM | POA: Diagnosis not present

## 2022-02-27 DIAGNOSIS — M810 Age-related osteoporosis without current pathological fracture: Secondary | ICD-10-CM | POA: Diagnosis not present

## 2022-02-27 DIAGNOSIS — J449 Chronic obstructive pulmonary disease, unspecified: Secondary | ICD-10-CM | POA: Diagnosis not present

## 2022-03-13 ENCOUNTER — Other Ambulatory Visit: Payer: Self-pay | Admitting: Orthopaedic Surgery

## 2022-03-27 ENCOUNTER — Other Ambulatory Visit: Payer: Self-pay | Admitting: Orthopaedic Surgery

## 2022-04-09 ENCOUNTER — Telehealth: Payer: Self-pay | Admitting: Orthopaedic Surgery

## 2022-04-09 ENCOUNTER — Other Ambulatory Visit: Payer: Self-pay

## 2022-04-09 DIAGNOSIS — M4807 Spinal stenosis, lumbosacral region: Secondary | ICD-10-CM

## 2022-04-09 NOTE — Telephone Encounter (Signed)
Pt daughter called and was asking about pt getting an epidural injection

## 2022-04-09 NOTE — Telephone Encounter (Signed)
Order placed in chart. Pt's daughter was called and informed

## 2022-04-19 ENCOUNTER — Other Ambulatory Visit: Payer: Self-pay | Admitting: Orthopaedic Surgery

## 2022-04-30 ENCOUNTER — Other Ambulatory Visit: Payer: Self-pay

## 2022-04-30 MED ORDER — LINACLOTIDE 145 MCG PO CAPS: 145.0000 ug | ORAL_CAPSULE | Freq: Every day | ORAL | 0 refills | Status: DC

## 2022-05-07 ENCOUNTER — Other Ambulatory Visit: Payer: Self-pay | Admitting: Orthopaedic Surgery

## 2022-05-08 ENCOUNTER — Telehealth: Payer: Self-pay

## 2022-05-08 MED ORDER — OMEPRAZOLE 40 MG PO CPDR: 40.0000 mg | DELAYED_RELEASE_CAPSULE | Freq: Two times a day (BID) | ORAL | 0 refills | Status: DC

## 2022-05-08 NOTE — Telephone Encounter (Signed)
Pharmacy requested refill of Omeprazole. Refill has been sent in. Patient is due for follow up appointment. Patient needs to schedule before refills will be sent in after today.

## 2022-05-10 ENCOUNTER — Encounter: Payer: Self-pay | Admitting: Physical Medicine and Rehabilitation

## 2022-05-10 ENCOUNTER — Ambulatory Visit (INDEPENDENT_AMBULATORY_CARE_PROVIDER_SITE_OTHER): Payer: Medicare Other | Admitting: Physical Medicine and Rehabilitation

## 2022-05-10 ENCOUNTER — Ambulatory Visit: Payer: Self-pay

## 2022-05-10 VITALS — BP 159/70 | HR 59

## 2022-05-10 DIAGNOSIS — M5416 Radiculopathy, lumbar region: Secondary | ICD-10-CM | POA: Diagnosis not present

## 2022-05-10 MED ORDER — METHYLPREDNISOLONE ACETATE 80 MG/ML IJ SUSP
80.0000 mg | Freq: Once | INTRAMUSCULAR | Status: AC
  Administered 2022-05-10: 80 mg

## 2022-05-10 NOTE — Patient Instructions (Signed)

## 2022-05-10 NOTE — Progress Notes (Signed)
Pt state lower back pain that travels down both legs. Pt state standing and walking makes the pain worse. Pt state she takes pain meds and creams to help ease her pain.  Numeric Pain Rating Scale and Functional Assessment Average Pain 10   In the last MONTH (on 0-10 scale) has pain interfered with the following?  1. General activity like being  able to carry out your everyday physical activities such as walking, climbing stairs, carrying groceries, or moving a chair?  Rating(10)   +Driver, +BT, -Dye Allergies.

## 2022-05-14 ENCOUNTER — Ambulatory Visit (INDEPENDENT_AMBULATORY_CARE_PROVIDER_SITE_OTHER): Payer: Medicare Other | Admitting: Podiatry

## 2022-05-14 ENCOUNTER — Encounter: Payer: Self-pay | Admitting: Podiatry

## 2022-05-14 DIAGNOSIS — M79674 Pain in right toe(s): Secondary | ICD-10-CM

## 2022-05-14 DIAGNOSIS — M79675 Pain in left toe(s): Secondary | ICD-10-CM

## 2022-05-14 DIAGNOSIS — D689 Coagulation defect, unspecified: Secondary | ICD-10-CM

## 2022-05-14 DIAGNOSIS — B351 Tinea unguium: Secondary | ICD-10-CM

## 2022-05-14 DIAGNOSIS — I739 Peripheral vascular disease, unspecified: Secondary | ICD-10-CM

## 2022-05-14 NOTE — Progress Notes (Signed)
This patient returns to my office for at risk foot care.  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  She says she has PAD.  This patient is unable to cut nails herself since the patient cannot reach her nails.These nails are painful walking and wearing shoes.  This patient presents for at risk foot care today.  General Appearance  Alert, conversant and in no acute stress.  Vascular  Dorsalis pedis and posterior tibial  pulses are palpable  bilaterally.  Capillary return is within normal limits  bilaterally. Temperature is within normal limits  bilaterally. Absent digital hair.  Neurologic  Senn-Weinstein monofilament wire test within normal limits  bilaterally. Muscle power within normal limits bilaterally.  Nails Thick disfigured discolored nails with subungual debris  from hallux to fifth toes bilaterally. No evidence of bacterial infection or drainage bilaterally.  Orthopedic  No limitations of motion  feet .  No crepitus or effusions noted.  No bony pathology or digital deformities noted.  HAV  B/L.  Skin  normotropic skin with no porokeratosis noted bilaterally.  No signs of infections or ulcers noted.     Onychomycosis  Pain in right toes  Pain in left toes  Consent was obtained for treatment procedures.   Mechanical debridement of nails 1-5  bilaterally performed with a nail nipper.  Filed with dremel without incident.    Return office visit    3 months                  Told patient to return for periodic foot care and evaluation due to potential at risk complications.   Creedence Kunesh DPM   

## 2022-05-21 ENCOUNTER — Other Ambulatory Visit: Payer: Self-pay | Admitting: Orthopaedic Surgery

## 2022-05-21 NOTE — Procedures (Signed)
Lumbosacral Transforaminal Epidural Steroid Injection - Sub-Pedicular Approach with Fluoroscopic Guidance  Patient: Kristin Davidson      Date of Birth: Jun 24, 1941 MRN: [id number] PCP: [name], MD      Visit Date: 05/10/2022   Universal Protocol:    Date/Time: 05/10/2022  Consent Given By: the patient  Position: PRONE  Additional Comments: Vital signs were monitored before and after the procedure. Patient was prepped and draped in the usual sterile fashion. The correct patient, procedure, and site was verified.   Injection Procedure Details:   Procedure diagnoses: Lumbar radiculopathy [M54.16]    Meds Administered:  Meds ordered this encounter  Medications   methylPREDNISolone acetate (DEPO-MEDROL) injection 80 mg    Laterality: Bilateral  Location/Site: L4  Needle:5.0 in., 22 ga.  Short bevel or Quincke spinal needle  Needle Placement: Transforaminal  Findings:    -Comments: Excellent flow of contrast along the nerve, nerve root and into the epidural space.  Procedure Details: After squaring off the end-plates to get a true AP view, the C-arm was positioned so that an oblique view of the foramen as noted above was visualized. The target area is just inferior to the "nose of the scotty dog" or sub pedicular. The soft tissues overlying this structure were infiltrated with 2-3 ml. of 1% Lidocaine without Epinephrine.  The spinal needle was inserted toward the target using a "trajectory" view along the fluoroscope beam.  Under AP and lateral visualization, the needle was advanced so it did not puncture dura and was located close the 6 O'Clock position of the pedical in AP tracterory. Biplanar projections were used to confirm position. Aspiration was confirmed to be negative for CSF and/or blood. A 1-2 ml. volume of Isovue-250 was injected and flow of contrast was noted at each level. Radiographs were obtained for documentation purposes.   After attaining the desired  flow of contrast documented above, a 0.5 to 1.0 ml test dose of 0.25% Marcaine was injected into each respective transforaminal space.  The patient was observed for 90 seconds post injection.  After no sensory deficits were reported, and normal lower extremity motor function was noted,   the above injectate was administered so that equal amounts of the injectate were placed at each foramen (level) into the transforaminal epidural space.   Additional Comments:  The patient tolerated the procedure well Dressing: 2 x 2 sterile gauze and Band-Aid    Post-procedure details: Patient was observed during the procedure. Post-procedure instructions were reviewed.  Patient left the clinic in stable condition.

## 2022-05-21 NOTE — Progress Notes (Signed)
GLENDENE Davidson - 81 y.o. female MRN [id number]  Date of birth: 1941-09-07  Office Visit Note: Visit Date: 05/10/2022 PCP: [name], MD Referred by: [name], MD  Subjective: Chief Complaint  Patient presents with   Lower Back - Pain   Right Leg - Pain   Left Leg - Pain   HPI:  Kristin Davidson is a 81 y.o. female who comes in today at the request of Dr. Jean Rosenthal for planned Bilateral L4-5 Lumbar Transforaminal epidural steroid injection with fluoroscopic guidance.  The patient has failed conservative care including home exercise, medications, time and activity modification.  This injection will be diagnostic and hopefully therapeutic.  Please see requesting physician notes for further details and justification.   ROS Otherwise per HPI.  Assessment & Plan: Visit Diagnoses:    ICD-10-CM   1. Lumbar radiculopathy  M54.16 XR C-ARM NO REPORT    Epidural Steroid injection    methylPREDNISolone acetate (DEPO-MEDROL) injection 80 mg      Plan: No additional findings.   Meds & Orders:  Meds ordered this encounter  Medications   methylPREDNISolone acetate (DEPO-MEDROL) injection 80 mg    Orders Placed This Encounter  Procedures   XR C-ARM NO REPORT   Epidural Steroid injection    Follow-up: Return for visit to requesting provider as needed.   Procedures: No procedures performed  Lumbosacral Transforaminal Epidural Steroid Injection - Sub-Pedicular Approach with Fluoroscopic Guidance  Patient: Kristin Davidson      Date of Birth: 12-Aug-1941 MRN: [id number] PCP: [name], MD      Visit Date: 05/10/2022   Universal Protocol:    Date/Time: 05/10/2022  Consent Given By: the patient  Position: PRONE  Additional Comments: Vital signs were monitored before and after the procedure. Patient was prepped and draped in the usual sterile fashion. The correct patient, procedure, and site was verified.   Injection Procedure Details:   Procedure  diagnoses: Lumbar radiculopathy [M54.16]    Meds Administered:  Meds ordered this encounter  Medications   methylPREDNISolone acetate (DEPO-MEDROL) injection 80 mg    Laterality: Bilateral  Location/Site: L4  Needle:5.0 in., 22 ga.  Short bevel or Quincke spinal needle  Needle Placement: Transforaminal  Findings:    -Comments: Excellent flow of contrast along the nerve, nerve root and into the epidural space.  Procedure Details: After squaring off the end-plates to get a true AP view, the C-arm was positioned so that an oblique view of the foramen as noted above was visualized. The target area is just inferior to the "nose of the scotty dog" or sub pedicular. The soft tissues overlying this structure were infiltrated with 2-3 ml. of 1% Lidocaine without Epinephrine.  The spinal needle was inserted toward the target using a "trajectory" view along the fluoroscope beam.  Under AP and lateral visualization, the needle was advanced so it did not puncture dura and was located close the 6 O'Clock position of the pedical in AP tracterory. Biplanar projections were used to confirm position. Aspiration was confirmed to be negative for CSF and/or blood. A 1-2 ml. volume of Isovue-250 was injected and flow of contrast was noted at each level. Radiographs were obtained for documentation purposes.   After attaining the desired flow of contrast documented above, a 0.5 to 1.0 ml test dose of 0.25% Marcaine was injected into each respective transforaminal space.  The patient was observed for 90 seconds post injection.  After no sensory deficits were reported, and normal lower extremity  motor function was noted,   the above injectate was administered so that equal amounts of the injectate were placed at each foramen (level) into the transforaminal epidural space.   Additional Comments:  The patient tolerated the procedure well Dressing: 2 x 2 sterile gauze and Band-Aid    Post-procedure  details: Patient was observed during the procedure. Post-procedure instructions were reviewed.  Patient left the clinic in stable condition.    Clinical History: No specialty comments available.     Objective:  VS:  HT:    WT:   BMI:     BP:(!) 159/70  HR:(!) 59bpm  TEMP: ( )  RESP:  Physical Exam Vitals and nursing note reviewed.  Constitutional:      General: She is not in acute distress.    Appearance: Normal appearance. She is not ill-appearing.  HENT:     Head: Normocephalic and atraumatic.     Right Ear: External ear normal.     Left Ear: External ear normal.  Eyes:     Extraocular Movements: Extraocular movements intact.  Cardiovascular:     Rate and Rhythm: Normal rate.     Pulses: Normal pulses.  Pulmonary:     Effort: Pulmonary effort is normal. No respiratory distress.  Abdominal:     General: There is no distension.     Palpations: Abdomen is soft.  Musculoskeletal:        General: Tenderness present. No swelling or deformity.     Cervical back: Neck supple.     Right lower leg: No edema.     Left lower leg: No edema.     Comments: Patient has good distal strength with no pain over the greater trochanters.  No clonus or focal weakness.  Skin:    General: Skin is warm and dry.     Findings: No erythema, lesion or rash.  Neurological:     General: No focal deficit present.     Mental Status: She is alert and oriented to person, place, and time.     Sensory: No sensory deficit.     Motor: No weakness or abnormal muscle tone.     Coordination: Coordination normal.  Psychiatric:        Mood and Affect: Mood normal.        Behavior: Behavior normal.      Imaging: No results found.

## 2022-05-28 ENCOUNTER — Telehealth: Payer: Self-pay | Admitting: Family

## 2022-05-28 DIAGNOSIS — I10 Essential (primary) hypertension: Secondary | ICD-10-CM

## 2022-05-28 NOTE — Telephone Encounter (Signed)
Pt c/o medication issue:  1. Name of Medication: furosemide (LASIX) 20 MG tablet  2. How are you currently taking this medication (dosage and times per day)? TAKE 2 TABLETS ON TUESDAYS AND SATURDAYS. ALL OTHER DAYS TAKE 1 TABLET  3. Are you having a reaction (difficulty breathing--STAT)? No   4. What is your medication issue? Patient is saying that medication is making her have very painful muscle spasms/cramps. Please call back to discuss

## 2022-05-28 NOTE — Telephone Encounter (Signed)
Spoke with patient and daughter regarding cramping in feet, legs, and hands Cramping has been happening often over the lasts several days, lasting 5-10  minutes Unsure how often happening prior to last few days  Concerned coming from increase in Furosemide. Advised that change was made 06/2022, follow up labs ok  Daughter is increasing potassium rich foods and drinks but will forward to Lauro Regulus NP for review

## 2022-05-29 NOTE — Telephone Encounter (Signed)
As medications changed a number of months ago would be uncommon to suddenly have cramping.  Recommend BMP, magnesium labwork at her convenience for monitoring.   Loel Dubonnet, NP

## 2022-05-29 NOTE — Telephone Encounter (Signed)
RN returned call to patient's daughter (ok per DPR) provided the following recommendations and daughter states that she will take her to get labs done tomorrow morning at the [facility]! Labs ordered.        "As medications changed a number of months ago would be uncommon to suddenly have cramping.  Recommend BMP, magnesium labwork at her convenience for monitoring.    Loel Dubonnet, NP "

## 2022-05-30 DIAGNOSIS — I10 Essential (primary) hypertension: Secondary | ICD-10-CM | POA: Diagnosis not present

## 2022-05-30 LAB — BASIC METABOLIC PANEL
BUN/Creatinine Ratio: 14 (ref 12–28)
BUN: 22 mg/dL (ref 8–27)
CO2: 22 mmol/L (ref 20–29)
Calcium: 9.6 mg/dL (ref 8.7–10.3)
Chloride: 102 mmol/L (ref 96–106)
Creatinine, Ser: 1.61 mg/dL — ABNORMAL HIGH (ref 0.57–1.00)
Glucose: 156 mg/dL — ABNORMAL HIGH (ref 70–99)
Potassium: 4.6 mmol/L (ref 3.5–5.2)
Sodium: 141 mmol/L (ref 134–144)
eGFR: 32 mL/min/{1.73_m2} — ABNORMAL LOW (ref >59)

## 2022-05-30 LAB — MAGNESIUM: Magnesium: 2.1 mg/dL (ref 1.6–2.3)

## 2022-05-31 ENCOUNTER — Telehealth (HOSPITAL_BASED_OUTPATIENT_CLINIC_OR_DEPARTMENT_OTHER): Payer: Self-pay

## 2022-05-31 DIAGNOSIS — I5189 Other ill-defined heart diseases: Secondary | ICD-10-CM

## 2022-05-31 MED ORDER — FUROSEMIDE 20 MG PO TABS: 20.0000 mg | ORAL_TABLET | Freq: Every day | ORAL | 3 refills | Status: DC

## 2022-05-31 NOTE — Telephone Encounter (Addendum)
Results called to patient who verbalizes understanding!  Rx and labs ordered, labs mailed to patient.    ----- Message from Loel Dubonnet, NP sent at 05/31/2022  7:59 AM EDT ----- Normal electrolytes.  Kidney function decreased from previous.  Recommend reduce Lasix to 1 tablet daily with additional tablet only as needed for 5 pounds in 1 week.  Repeat BMP in 1 week.

## 2022-06-05 ENCOUNTER — Other Ambulatory Visit: Payer: Self-pay | Admitting: Physician Assistant

## 2022-06-06 ENCOUNTER — Telehealth: Payer: Self-pay | Admitting: Physician Assistant

## 2022-06-06 MED ORDER — OMEPRAZOLE 40 MG PO CPDR: 40.0000 mg | DELAYED_RELEASE_CAPSULE | Freq: Two times a day (BID) | ORAL | 1 refills | Status: DC

## 2022-06-06 MED ORDER — LINACLOTIDE 145 MCG PO CAPS: 145.0000 ug | ORAL_CAPSULE | Freq: Every day | ORAL | 1 refills | Status: DC

## 2022-06-06 NOTE — Telephone Encounter (Signed)
Pt daughter called needing rx linzess, and prilosec filled. Sch'd appt 10/9. Thank you

## 2022-06-06 NOTE — Telephone Encounter (Signed)
Linzess and Omeprazole prescriptions refilled until office appt.

## 2022-06-13 ENCOUNTER — Other Ambulatory Visit: Payer: Self-pay | Admitting: Internal Medicine

## 2022-06-13 DIAGNOSIS — Z1231 Encounter for screening mammogram for malignant neoplasm of breast: Secondary | ICD-10-CM

## 2022-06-25 ENCOUNTER — Other Ambulatory Visit: Payer: Self-pay | Admitting: Physician Assistant

## 2022-07-09 ENCOUNTER — Other Ambulatory Visit: Payer: Self-pay | Admitting: Cardiology

## 2022-07-09 ENCOUNTER — Other Ambulatory Visit: Payer: Self-pay | Admitting: Orthopaedic Surgery

## 2022-07-09 DIAGNOSIS — E119 Type 2 diabetes mellitus without complications: Secondary | ICD-10-CM | POA: Diagnosis not present

## 2022-07-16 ENCOUNTER — Ambulatory Visit: Payer: Medicare Other | Admitting: Physician Assistant

## 2022-07-18 ENCOUNTER — Ambulatory Visit: Payer: Medicare Other

## 2022-07-26 ENCOUNTER — Other Ambulatory Visit: Payer: Self-pay | Admitting: Orthopaedic Surgery

## 2022-08-07 ENCOUNTER — Other Ambulatory Visit: Payer: Self-pay | Admitting: Orthopaedic Surgery

## 2022-08-14 ENCOUNTER — Ambulatory Visit: Payer: Medicare Other | Admitting: Physician Assistant

## 2022-08-14 ENCOUNTER — Encounter: Payer: Self-pay | Admitting: Physician Assistant

## 2022-08-14 VITALS — BP 144/78 | HR 60 | Ht 61.0 in | Wt 168.1 lb

## 2022-08-14 DIAGNOSIS — K219 Gastro-esophageal reflux disease without esophagitis: Secondary | ICD-10-CM | POA: Diagnosis not present

## 2022-08-14 DIAGNOSIS — K5909 Other constipation: Secondary | ICD-10-CM

## 2022-08-14 DIAGNOSIS — R1310 Dysphagia, unspecified: Secondary | ICD-10-CM | POA: Diagnosis not present

## 2022-08-14 MED ORDER — LINACLOTIDE 145 MCG PO CAPS: 145.0000 ug | ORAL_CAPSULE | Freq: Every day | ORAL | 3 refills | Status: DC

## 2022-08-14 MED ORDER — PANTOPRAZOLE SODIUM 40 MG PO TBEC: 40.0000 mg | DELAYED_RELEASE_TABLET | Freq: Two times a day (BID) | ORAL | 5 refills | Status: DC

## 2022-08-14 NOTE — Progress Notes (Signed)
Chief Complaint: Chronic constipation and GERD with dysphagia  HPI:    Kristin Davidson is an 81 year old African-American female, known to Dr. Havery [name], with a past medical history as listed below including A-fib on Eliquis, COPD and GERD, who presents to clinic today accompanied by her daughter who assists with history and follows up for constipation, dysphagia and GERD.     10/13/2018 colonoscopy with 1 5 mm polyp and diverticulosis.  Pathology showed hyperplastic polyp and no repeat colonoscopy is recommended.     04/13/2021 office visit with me to discuss left upper quadrant pain indigestion, constipation and GERD.  At that time increase Omeprazole to 40 twice daily and prescribed Linzess 145 daily.  Also ended up scheduling EGD.       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  Biopsies are normal.    Today, the patient tells me that she occasionally has issues when swallowing food and feels like it gets stuck in her throat, per her daughter this is because she does not wear her dentures and does not chew her few food well which does not help at all.  Patient tells me it is hard to remember to do.  Also discusses some what feels like a "pepper spray" in her throat at times throughout the day which then makes her cough.  This is regardless of her Omeprazole 40 mg twice a day.  Apparently PCP tried to add Famotidine as well but she could not afford this.  Denies any abdominal pain today.    Also discusses constipation.  She likes how the Linzess 145 mcg daily works even though sometimes it gives her an urgent stool.    Denies fever, chills, weight loss or blood in her stool.  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     amLODipine (NORVASC) 10 MG tablet TAKE 1 TABLET(10 MG) BY MOUTH DAILY 90 tablet 3   baclofen (LIORESAL) 10 MG tablet TAKE 1 TABLET(10 MG) BY MOUTH THREE TIMES DAILY AS NEEDED FOR MUSCLE SPASMS 30 tablet 0   benazepril (LOTENSIN) 20 MG tablet TAKE 1 AND 1/2 TABLETS BY MOUTH EVERY DAY 135 tablet 1   ELIQUIS 5 MG TABS tablet TAKE 1 TABLET(5 MG) BY MOUTH TWICE DAILY 180 tablet 1   furosemide (LASIX) 20 MG tablet Take 1 tablet (20 mg total) by mouth daily. Take one '20mg'$  tablet daily. May take an extra tablet daily as needed for swelling or weight gain of  2 pounds overnight or 5 pounds in one week. 120 tablet 3   gabapentin (NEURONTIN) 100 MG capsule Take 200 mg by mouth daily.     Homeopathic Products ([place]) FOAM See admin instructions.     linaclotide (LINZESS) 145 MCG CAPS capsule Take 1 capsule (145 mcg total) by mouth daily before breakfast. 30 capsule 1   omeprazole (PRILOSEC) 40 MG capsule Take 1 capsule (40 mg total) by mouth 2 (two) times daily before a meal. 60 capsule 1   albuterol (VENTOLIN HFA) 108 (90 Base) MCG/ACT inhaler Inhale 2 puffs into the lungs every 4 (four) hours as needed. (Patient not taking: Reported on 08/14/2022)     No current facility-administered medications for this visit.    Allergies as of 08/14/2022 - Review Complete 08/14/2022  Allergen Reaction Noted    Alendronate sodium Other (See Comments) 08/08/2020   Hydromorphone hcl  06/10/2009   Naproxen sodium Itching    Sulfonamide derivatives     Tramadol Hives 02/02/2022   Tramadol hcl Other (See Comments)    Naproxen Rash 02/02/2022    Family History  Problem Relation Age of Onset   Bone cancer Mother    Heart attack Father 35   Colon cancer Neg Hx    Esophageal cancer Neg Hx    Rectal cancer Neg Hx    Stomach cancer Neg Hx    Colon polyps Neg Hx     Social History   Socioeconomic History   Marital status: Widowed    Spouse name: Not on file   Number of children: Not on file   Years of education: Not on file   Highest education level: Not on file  Occupational History   Not on file  Tobacco Use   Smoking status: Former    Packs/day: 1.00    Years: 59.00    Total pack years: 59.00    Types: Cigarettes    Quit date: 06/08/2018    Years since quitting: 4.1    Passive exposure: Past   Smokeless tobacco: Never  Vaping Use   Vaping Use: Never used  Substance and Sexual Activity   Alcohol use: Never   Drug use: Never   Sexual activity: Not on file  Other Topics Concern   Not on file  Social History Narrative   Not on file   Social Determinants of Health   Financial Resource Strain: Not on file  Food Insecurity: Not on file  Transportation Needs: Not on file  Physical Activity: Not on file  Stress: Not on file  Social Connections: Not on file  Intimate Partner Violence: Not on file    Review of Systems:    Constitutional: No weight loss, fever or chills Cardiovascular: No chest pain Respiratory: No SOB  Gastrointestinal: See HPI and otherwise negative   Physical Exam:  Vital signs: BP (!) 144/78   Pulse 60   Ht '5\' 1"'$  (1.549 m)   Wt 168 lb 2 oz (76.3 kg)   BMI 31.77 kg/m    Constitutional:   Pleasant Elderly AA female appears to be in NAD, Well developed, Well nourished, alert and cooperative Respiratory: Respirations even and unlabored. Lungs clear to  auscultation bilaterally.   No wheezes, crackles, or rhonchi.  Cardiovascular: Normal S1, S2. No MRG. Regular rate and rhythm. No peripheral edema, cyanosis or pallor.  Gastrointestinal:  Soft, nondistended, nontender. No rebound or guarding. Normal bowel sounds. No appreciable masses or hepatomegaly. Rectal:  Not performed.  Psychiatric: Oriented to person, place  and time. Demonstrates good judgement and reason without abnormal affect or behaviors.  RELEVANT LABS AND IMAGING: CBC    Component Value Date/Time   WBC 5.5 05/09/2020 0553   RBC 4.65 05/09/2020 0553   HGB 13.1 05/09/2020 0553   HCT 42.3 05/09/2020 0553   PLT 143 (L) 05/09/2020 0553   MCV 91.0 05/09/2020 0553   MCH 28.2 05/09/2020 0553   MCHC 31.0 05/09/2020 0553   RDW 13.2 05/09/2020 0553   LYMPHSABS 1.2 05/09/2020 0553   MONOABS 0.6 05/09/2020 0553   EOSABS 0.2 05/09/2020 0553   BASOSABS 0.0 05/09/2020 0553    CMP     Component Value Date/Time   NA 141 05/30/2022 1044   K 4.6 05/30/2022 1044   CL 102 05/30/2022 1044   CO2 22 05/30/2022 1044   GLUCOSE 156 (H) 05/30/2022 1044   GLUCOSE 119 (H) 05/09/2020 0553   BUN 22 05/30/2022 1044   CREATININE 1.61 (H) 05/30/2022 1044   CALCIUM 9.6 05/30/2022 1044   PROT 6.2 (L) 05/09/2020 0553   ALBUMIN 3.4 (L) 05/09/2020 0553   AST 23 05/09/2020 0553   ALT 24 05/09/2020 0553   ALKPHOS 68 05/09/2020 0553   BILITOT 0.4 05/09/2020 0553   GFRNONAA 38 (L) 07/25/2020 1120   GFRAA 43 (L) 07/25/2020 1120    Assessment: 1.  Dysphagia: continues to be generally, likely related mostly to poor denture fit 2.  GERD: Better but not completely resolved on Omeprazole 40 twice daily, recent EGD in 2022 with no real abnormalities 3.  Constipation: Patient likes her Linzess 145 mcg daily  Plan: 1.  Apparently patient cannot afford Famotidine.  For now we will trial a change in her PPI.  Stop Omeprazole and prescribed Pantoprazole 40 mg twice daily, 30-60 minutes before breakfast and  dinner.  #60 with 5 refills. 2.  Reviewed anti-dysphagia measures including taking small bites, drinking water in between bites, avoiding distraction while eating and the chin tuck technique. 3.  If patient does not see any benefit from switching to Pantoprazole would recommend a barium esophagram with tablet for further evaluation. 4.  Patient would like to continue on Linzess 145 mcg daily.  Prescribed #90 with 3 refills. 5.  Patient to follow in clinic with [place] as needed.  Ellouise Newer, PA-C [facility] 08/14/2022, 10:06 AM  Cc: [name], MD

## 2022-08-14 NOTE — Progress Notes (Signed)
Agree with assessment and plan as outlined.  

## 2022-08-14 NOTE — Patient Instructions (Signed)
Stop Omeprazole.   We have sent the following medications to your pharmacy for you to pick up at your convenience: Pantoprazole 40 mg twice daily & Linzess 145  Follow up as needed.  It was a pleasure to see you today!  Thank you for trusting me with your gastrointestinal care!

## 2022-08-19 ENCOUNTER — Other Ambulatory Visit: Payer: Self-pay | Admitting: Cardiology

## 2022-08-20 ENCOUNTER — Ambulatory Visit: Payer: Medicare Other | Admitting: Podiatry

## 2022-08-20 NOTE — Telephone Encounter (Signed)
Prescription refill request for Eliquis received. Indication:afib Last office visit:4/23 Scr:1.6 Age: 81 Weight:76.3 kg  Prescription refilled

## 2022-08-21 ENCOUNTER — Ambulatory Visit: Payer: Medicare Other

## 2022-08-21 DIAGNOSIS — I10 Essential (primary) hypertension: Secondary | ICD-10-CM | POA: Diagnosis not present

## 2022-08-21 DIAGNOSIS — N1831 Chronic kidney disease, stage 3a: Secondary | ICD-10-CM | POA: Diagnosis not present

## 2022-08-21 DIAGNOSIS — J449 Chronic obstructive pulmonary disease, unspecified: Secondary | ICD-10-CM | POA: Diagnosis not present

## 2022-08-21 DIAGNOSIS — E7800 Pure hypercholesterolemia, unspecified: Secondary | ICD-10-CM | POA: Diagnosis not present

## 2022-08-21 DIAGNOSIS — M810 Age-related osteoporosis without current pathological fracture: Secondary | ICD-10-CM | POA: Diagnosis not present

## 2022-08-21 DIAGNOSIS — E1169 Type 2 diabetes mellitus with other specified complication: Secondary | ICD-10-CM | POA: Diagnosis not present

## 2022-08-27 ENCOUNTER — Other Ambulatory Visit: Payer: Self-pay | Admitting: Orthopaedic Surgery

## 2022-08-28 ENCOUNTER — Ambulatory Visit
Admission: RE | Admit: 2022-08-28 | Discharge: 2022-08-28 | Disposition: A | Discharge status: Home / Self Care | Payer: Medicare Other | Source: Ambulatory Visit | Attending: Internal Medicine | Admitting: Internal Medicine

## 2022-08-28 DIAGNOSIS — Z1231 Encounter for screening mammogram for malignant neoplasm of breast: Secondary | ICD-10-CM

## 2022-09-17 ENCOUNTER — Other Ambulatory Visit: Payer: Self-pay | Admitting: Orthopaedic Surgery

## 2022-09-24 ENCOUNTER — Other Ambulatory Visit: Payer: Self-pay | Admitting: Cardiology

## 2022-09-24 NOTE — Telephone Encounter (Signed)
Rx refill sent to pharmacy. 

## 2022-10-03 ENCOUNTER — Other Ambulatory Visit: Payer: Self-pay | Admitting: Orthopaedic Surgery

## 2022-10-10 ENCOUNTER — Ambulatory Visit: Payer: Medicare Other | Admitting: Podiatry

## 2022-10-10 ENCOUNTER — Encounter: Payer: Self-pay | Admitting: Podiatry

## 2022-10-10 DIAGNOSIS — M79675 Pain in left toe(s): Secondary | ICD-10-CM

## 2022-10-10 DIAGNOSIS — M2012 Hallux valgus (acquired), left foot: Secondary | ICD-10-CM | POA: Diagnosis not present

## 2022-10-10 DIAGNOSIS — M79674 Pain in right toe(s): Secondary | ICD-10-CM

## 2022-10-10 DIAGNOSIS — I739 Peripheral vascular disease, unspecified: Secondary | ICD-10-CM

## 2022-10-10 DIAGNOSIS — N1831 Chronic kidney disease, stage 3a: Secondary | ICD-10-CM | POA: Diagnosis not present

## 2022-10-10 DIAGNOSIS — D689 Coagulation defect, unspecified: Secondary | ICD-10-CM | POA: Diagnosis not present

## 2022-10-10 DIAGNOSIS — B351 Tinea unguium: Secondary | ICD-10-CM | POA: Diagnosis not present

## 2022-10-10 DIAGNOSIS — M2011 Hallux valgus (acquired), right foot: Secondary | ICD-10-CM

## 2022-10-10 NOTE — Progress Notes (Signed)
This patient returns to my office for at risk foot care.  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  She says she has PAD.  This patient is unable to cut nails herself since the patient cannot reach her nails.These nails are painful walking and wearing shoes.  This patient presents for at risk foot care today.  General Appearance  Alert, conversant and in no acute stress.  Vascular  Dorsalis pedis and posterior tibial  pulses are palpable  bilaterally.  Capillary return is within normal limits  bilaterally. Temperature is within normal limits  bilaterally. Absent digital hair.  Neurologic  Senn-Weinstein monofilament wire test within normal limits  bilaterally. Muscle power within normal limits bilaterally.  Nails Thick disfigured discolored nails with subungual debris  from hallux to fifth toes bilaterally. No evidence of bacterial infection or drainage bilaterally.  Orthopedic  No limitations of motion  feet .  No crepitus or effusions noted.  No bony pathology or digital deformities noted.  HAV  B/L.  Skin  normotropic skin with no porokeratosis noted bilaterally.  No signs of infections or ulcers noted.     Onychomycosis  Pain in right toes  Pain in left toes  Consent was obtained for treatment procedures.   Mechanical debridement of nails 1-5  bilaterally performed with a nail nipper.  Filed with dremel without incident.    Return office visit    3 months                  Told patient to return for periodic foot care and evaluation due to potential at risk complications.   Gardiner Barefoot DPM

## 2022-10-20 ENCOUNTER — Other Ambulatory Visit: Payer: Self-pay | Admitting: Orthopaedic Surgery

## 2022-11-08 ENCOUNTER — Other Ambulatory Visit: Payer: Self-pay | Admitting: Orthopaedic Surgery

## 2022-11-29 ENCOUNTER — Other Ambulatory Visit: Payer: Self-pay | Admitting: Orthopaedic Surgery

## 2022-12-04 DIAGNOSIS — M810 Age-related osteoporosis without current pathological fracture: Secondary | ICD-10-CM | POA: Diagnosis not present

## 2022-12-04 DIAGNOSIS — I10 Essential (primary) hypertension: Secondary | ICD-10-CM | POA: Diagnosis not present

## 2022-12-04 DIAGNOSIS — I480 Paroxysmal atrial fibrillation: Secondary | ICD-10-CM | POA: Diagnosis not present

## 2022-12-04 DIAGNOSIS — N1831 Chronic kidney disease, stage 3a: Secondary | ICD-10-CM | POA: Diagnosis not present

## 2022-12-04 DIAGNOSIS — E7800 Pure hypercholesterolemia, unspecified: Secondary | ICD-10-CM | POA: Diagnosis not present

## 2022-12-04 DIAGNOSIS — J449 Chronic obstructive pulmonary disease, unspecified: Secondary | ICD-10-CM | POA: Diagnosis not present

## 2022-12-04 DIAGNOSIS — E1169 Type 2 diabetes mellitus with other specified complication: Secondary | ICD-10-CM | POA: Diagnosis not present

## 2022-12-05 DIAGNOSIS — I480 Paroxysmal atrial fibrillation: Secondary | ICD-10-CM | POA: Diagnosis not present

## 2022-12-05 DIAGNOSIS — D6869 Other thrombophilia: Secondary | ICD-10-CM | POA: Diagnosis not present

## 2022-12-05 DIAGNOSIS — R413 Other amnesia: Secondary | ICD-10-CM | POA: Diagnosis not present

## 2022-12-05 DIAGNOSIS — N1831 Chronic kidney disease, stage 3a: Secondary | ICD-10-CM | POA: Diagnosis not present

## 2022-12-05 DIAGNOSIS — E7800 Pure hypercholesterolemia, unspecified: Secondary | ICD-10-CM | POA: Diagnosis not present

## 2022-12-05 DIAGNOSIS — E1122 Type 2 diabetes mellitus with diabetic chronic kidney disease: Secondary | ICD-10-CM | POA: Diagnosis not present

## 2022-12-05 DIAGNOSIS — E1169 Type 2 diabetes mellitus with other specified complication: Secondary | ICD-10-CM | POA: Diagnosis not present

## 2022-12-13 DIAGNOSIS — E119 Type 2 diabetes mellitus without complications: Secondary | ICD-10-CM | POA: Diagnosis not present

## 2022-12-18 DIAGNOSIS — E538 Deficiency of other specified B group vitamins: Secondary | ICD-10-CM | POA: Diagnosis not present

## 2022-12-18 DIAGNOSIS — R413 Other amnesia: Secondary | ICD-10-CM | POA: Diagnosis not present

## 2022-12-18 DIAGNOSIS — N179 Acute kidney failure, unspecified: Secondary | ICD-10-CM | POA: Diagnosis not present

## 2022-12-24 ENCOUNTER — Other Ambulatory Visit: Payer: Self-pay | Admitting: Cardiology

## 2022-12-24 ENCOUNTER — Other Ambulatory Visit: Payer: Self-pay | Admitting: Orthopaedic Surgery

## 2022-12-25 ENCOUNTER — Encounter: Payer: Self-pay | Admitting: Physician Assistant

## 2022-12-25 ENCOUNTER — Other Ambulatory Visit: Payer: Self-pay | Admitting: Cardiology

## 2022-12-25 ENCOUNTER — Ambulatory Visit (INDEPENDENT_AMBULATORY_CARE_PROVIDER_SITE_OTHER): Payer: Medicare Other | Admitting: Physician Assistant

## 2022-12-25 ENCOUNTER — Ambulatory Visit: Payer: Medicare Other

## 2022-12-25 VITALS — BP 148/80 | HR 78 | Resp 20 | Ht 61.0 in

## 2022-12-25 DIAGNOSIS — N1832 Chronic kidney disease, stage 3b: Secondary | ICD-10-CM | POA: Diagnosis not present

## 2022-12-25 DIAGNOSIS — R413 Other amnesia: Secondary | ICD-10-CM

## 2022-12-25 DIAGNOSIS — R051 Acute cough: Secondary | ICD-10-CM | POA: Diagnosis not present

## 2022-12-25 DIAGNOSIS — R0601 Orthopnea: Secondary | ICD-10-CM | POA: Diagnosis not present

## 2022-12-25 DIAGNOSIS — E538 Deficiency of other specified B group vitamins: Secondary | ICD-10-CM | POA: Diagnosis not present

## 2022-12-25 DIAGNOSIS — I480 Paroxysmal atrial fibrillation: Secondary | ICD-10-CM | POA: Diagnosis not present

## 2022-12-25 DIAGNOSIS — R059 Cough, unspecified: Secondary | ICD-10-CM | POA: Diagnosis not present

## 2022-12-25 MED ORDER — DIAZEPAM 5 MG PO TABS: ORAL_TABLET | ORAL | 0 refills | Status: DC

## 2022-12-25 NOTE — Telephone Encounter (Signed)
Agree since 2 most SCr have trended > 1.5 and age is > 80, should reduce Eliquis dose to 2.5mg  BID.

## 2022-12-25 NOTE — Patient Instructions (Addendum)
It was a pleasure to see you today at our office.   Recommendations:   Open MRI of the brain, the radiology office will call you to arrange you appointment Follow up in May after the MRI results  Prescription for Valium 5 mg 30 mins prior to the MRI was written, thanks  Whom to call:  Memory  decline, memory medications: Call our office [phone]   For psychiatric meds, mood meds: Please have your primary care physician manage these medications.       For assessment of decision of mental capacity and competency:  Call Dr. Anthoney Harada, geriatric psychiatrist at [phone]  For guidance in geriatric dementia issues please call [facility] [phone]    If you have any severe symptoms of a stroke, or other severe issues such as confusion,severe chills or fever, etc call 911 or go to the ER as you may need to be evaluated further   Feel free to visit Facebook page " [email or web address]" for tips of how to care for people with memory problems.       RECOMMENDATIONS FOR ALL PATIENTS WITH MEMORY PROBLEMS: 1. Continue to exercise (Recommend 30 minutes of walking everyday, or 3 hours every week) 2. Increase social interactions - continue going to [place] and enjoy social gatherings with friends and family 3. Eat healthy, avoid fried foods and eat more fruits and vegetables 4. Maintain adequate blood pressure, blood sugar, and blood cholesterol level. Reducing the risk of stroke and cardiovascular disease also helps promoting better memory. 5. Avoid stressful situations. Live a simple life and avoid aggravations. Organize your time and prepare for the next day in anticipation. 6. Sleep well, avoid any interruptions of sleep and avoid any distractions in the bedroom that may interfere with adequate sleep quality 7. Avoid sugar, avoid sweets as there is a strong link between excessive sugar intake, diabetes, and cognitive impairment We discussed the Mediterranean diet, which has been  shown to help patients reduce the risk of progressive memory disorders and reduces cardiovascular risk. This includes eating fish, eat fruits and green leafy vegetables, nuts like almonds and hazelnuts, walnuts, and also use olive oil. Avoid fast foods and fried foods as much as possible. Avoid sweets and sugar as sugar use has been linked to worsening of memory function.  There is always a concern of gradual progression of memory problems. If this is the case, then we may need to adjust level of care according to patient needs. Support, both to the patient and caregiver, should then be put into place.         FALL PRECAUTIONS: Be cautious when walking. Scan the area for obstacles that may increase the risk of trips and falls. When getting up in the mornings, sit up at the edge of the bed for a few minutes before getting out of bed. Consider elevating the bed at the head end to avoid drop of blood pressure when getting up. Walk always in a well-lit room (use night lights in the walls). Avoid area rugs or power cords from appliances in the middle of the walkways. Use a walker or a cane if necessary and consider physical therapy for balance exercise. Get your eyesight checked regularly.  FINANCIAL OVERSIGHT: Supervision, especially oversight when making financial decisions or transactions is also recommended.  HOME SAFETY: Consider the safety of the kitchen when operating appliances like stoves, microwave oven, and blender. Consider having supervision and share cooking responsibilities until no longer able to participate in those.  Accidents with firearms and other hazards in the house should be identified and addressed as well.   ABILITY TO BE LEFT ALONE: If patient is unable to contact 911 operator, consider using LifeLine, or when the need is there, arrange for someone to stay with patients. Smoking is a fire hazard, consider supervision or cessation. Risk of wandering should be assessed by caregiver  and if detected at any point, supervision and safe proof recommendations should be instituted.  MEDICATION SUPERVISION: Inability to self-administer medication needs to be constantly addressed. Implement a mechanism to ensure safe administration of the medications.   DRIVING: Regarding driving, in patients with progressive memory problems, driving will be impaired. We advise to have someone else do the driving if trouble finding directions or if minor accidents are reported. Independent driving assessment is available to determine safety of driving.   If you are interested in the driving assessment, you can contact the following:  [employer] in [place]  [place] [phone] or [phone]    [place] refers to food and lifestyle choices that are based on the traditions of countries located on the [employer]. This way of eating has been shown to help prevent certain conditions and improve outcomes for people who have chronic diseases, like kidney disease and heart disease. What are tips for following this plan? Lifestyle  Cook and eat meals together with your family, when possible. Drink enough fluid to keep your urine clear or pale yellow. Be physically active every day. This includes: Aerobic exercise like running or swimming. Leisure activities like gardening, walking, or housework. Get 7-8 hours of sleep each night. If recommended by your health care provider, drink red wine in moderation. This means 1 glass a day for nonpregnant women and 2 glasses a day for men. A glass of wine equals 5 oz (150 mL). Reading food labels  Check the serving size of packaged foods. For foods such as rice and pasta, the serving size refers to the amount of cooked product, not dry. Check the total fat in packaged foods. Avoid foods that have  saturated fat or trans fats. Check the ingredients list for added sugars, such as corn syrup. Shopping  At the grocery store, buy most of your food from the areas near the walls of the store. This includes: Fresh fruits and vegetables (produce). Grains, beans, nuts, and seeds. Some of these may be available in unpackaged forms or large amounts (in bulk). Fresh seafood. Poultry and eggs. Low-fat dairy products. Buy whole ingredients instead of prepackaged foods. Buy fresh fruits and vegetables in-season from local farmers markets. Buy frozen fruits and vegetables in resealable bags. If you do not have access to quality fresh seafood, buy precooked frozen shrimp or canned fish, such as tuna, salmon, or sardines. Buy small amounts of raw or cooked vegetables, salads, or olives from the deli or salad bar at your store. Stock your pantry so you always have certain foods on hand, such as olive oil, canned tuna, canned tomatoes, rice, pasta, and beans. Cooking  Cook foods with extra-virgin olive oil instead of using butter or other vegetable oils. Have meat as a side dish, and have vegetables or grains as your main dish. This means having meat in small portions or adding small amounts of meat to foods like pasta or stew. Use beans or vegetables instead of meat in common dishes like chili or lasagna. Experiment  with different cooking methods. Try roasting or broiling vegetables instead of steaming or sauteing them. Add frozen vegetables to soups, stews, pasta, or rice. Add nuts or seeds for added healthy fat at each meal. You can add these to yogurt, salads, or vegetable dishes. Marinate fish or vegetables using olive oil, lemon juice, garlic, and fresh herbs. Meal planning  Plan to eat 1 vegetarian meal one day each week. Try to work up to 2 vegetarian meals, if possible. Eat seafood 2 or more times a week. Have healthy snacks readily available, such as: Vegetable sticks with hummus. Greek  yogurt. Fruit and nut trail mix. Eat balanced meals throughout the week. This includes: Fruit: 2-3 servings a day Vegetables: 4-5 servings a day Low-fat dairy: 2 servings a day Fish, poultry, or lean meat: 1 serving a day Beans and legumes: 2 or more servings a week Nuts and seeds: 1-2 servings a day Whole grains: 6-8 servings a day Extra-virgin olive oil: 3-4 servings a day Limit red meat and sweets to only a few servings a month What are my food choices? Mediterranean diet Recommended Grains: Whole-grain pasta. Brown rice. Bulgar wheat. Polenta. Couscous. Whole-wheat bread. [place]. Vegetables: Artichokes. Beets. Broccoli. Cabbage. Carrots. Eggplant. Green beans. Chard. Kale. Spinach. Onions. Leeks. Peas. Squash. Tomatoes. Peppers. Radishes. Fruits: Apples. Apricots. Avocado. Berries. Bananas. Cherries. Dates. Figs. Grapes. Lemons. Melon. Oranges. Peaches. Plums. Pomegranate. Meats and other protein foods: Beans. Almonds. Sunflower seeds. Pine nuts. Peanuts. [place]. Salmon. Scallops. Shrimp. [place]. Tilapia. Clams. Oysters. Eggs. Dairy: Low-fat milk. Cheese. Greek yogurt. Beverages: Water. Red wine. Herbal tea. Fats and oils: Extra virgin olive oil. Avocado oil. Grape seed oil. Sweets and desserts: [place] yogurt with honey. Baked apples. Poached pears. Trail mix. Seasoning and other foods: Basil. Cilantro. Coriander. Cumin. Mint. Parsley. Sage. Rosemary. Tarragon. Garlic. Oregano. Thyme. Pepper. Balsalmic vinegar. Tahini. Hummus. Tomato sauce. Olives. Mushrooms. Limit these Grains: Prepackaged pasta or rice dishes. Prepackaged cereal with added sugar. Vegetables: Deep fried potatoes (french fries). Fruits: Fruit canned in syrup. Meats and other protein foods: Beef. Pork. Lamb. Poultry with skin. Hot dogs. [place] Salines. Dairy: Ice cream. Sour cream. Whole milk. Beverages: Juice. Sugar-sweetened soft drinks. Beer. Liquor and spirits. Fats and oils: Butter. Canola oil. Vegetable oil. Beef  fat (tallow). Lard. Sweets and desserts: Cookies. Cakes. Pies. Candy. Seasoning and other foods: Mayonnaise. Premade sauces and marinades. The items listed may not be a complete list. Talk with your dietitian about what dietary choices are right for you. Summary The Mediterranean diet includes both food and lifestyle choices. Eat a variety of fresh fruits and vegetables, beans, nuts, seeds, and whole grains. Limit the amount of red meat and sweets that you eat. Talk with your health care provider about whether it is safe for you to drink red wine in moderation. This means 1 glass a day for nonpregnant women and 2 glasses a day for men. A glass of wine equals 5 oz (150 mL). This information is not intended to replace advice given to you by your health care provider. Make sure you discuss any questions you have with your health care provider. Document Released: 05/17/2016 Document Revised: 06/19/2016 Document Reviewed: 05/17/2016 Elsevier Interactive Patient Education  2017 [employer].

## 2022-12-25 NOTE — Telephone Encounter (Signed)
Prescription refill request for Eliquis received. Indication: AF Last office visit: 01/10/22  Vella Raring NP Scr: 1.65 on 12/18/22  KPN Age: 82 Weight: 80kg  Based on above findings Eliquis 2.5mg  twice daily would be the appropriate dose based on age and Scr.  Pt is currently on 5mg  twice daily.  Message sent to PharmD pool to advise .

## 2022-12-25 NOTE — Progress Notes (Signed)
Assessment/Plan:    The patient is seen in neurologic consultation at the request of [name], MD for the evaluation of memory.  Kristin Davidson is a very pleasant 82 y.o. year old RH female with a history of hypertension, hyperlipidemia, GERD with dysphagia, A-fib on Eliquis, COPD, Prediabetes, gout, arthritis, depression, anxiety,  seen today for evaluation of memory loss. MoCA today is 20/30.She is able to perform several ADLs without significant difficulty.  She tries to remain independent.   Memory Impairment  MRI brain without contrast to assess for underlying structural abnormality and assess vascular load  Continue to control mood as per PCP  Recommend good control of cardiovascular risk factors.   Continue to replenish B12  Folllow up in  1 month do discuss the MRI brain findings, at which time may entertain antidementia meds   Subjective:    The patient is accompanied by [name] (daughter) who supplements the history.    How long did patient have memory difficulties? For at least 7 .  However, after the patient has moved with her daughter, she noticed that her mother was having difficulty remembering recent conversations, names of people, or instructions.  "Sometimes she thinks that she told me something but she never did "-daughter says. repeats oneself?  Endorsed by daughter, she may be asking the same question or telling the same story. Disoriented when walking into a room?   "Sometimes she cannot remember what she is doing and walking around in a circle ""we go to the bank she said why are we going here? I never been here before "(is always the same bank that they have been going for a long time).   Leaving objects in unusual places? Endorsed" she puts things that do not belong "  Wandering behavior? denies   Any personality changes ? Endorsed by her daughter.  She may be lying down, and then she has a spurt of energy and then she goes back to bed   Any history of  depression?:  Endorsed.  Gets frustrated more often. Hallucinations or paranoia?  denies   Seizures? denies    Any sleep changes?  Endorsed, she is frequent leg cramps at night.  She has a history of peripheral vascular disease, and this is being followed that by her cardiologist, which may be contributing to some of her symptoms.  In addition, she does not drink enough water.  She does not have iron deficiency anemia.  Denies vivid dreams, REM behavior or sleepwalking.    Sleep apnea? denies  Endorsed, but she refuses to use the CPAP Any hygiene concerns?  denies   Independent of bathing and dressing?  Endorsed  Does the patient need help with medications?  Daughter oversees, but tries to allow her to be in charge of things. Who is in charge of the finances? Patient  is in charge "there is something you cannot do is touch her wallet on her pocketbook ". Any changes in appetite?" She eats and she will say it kept her up all night, some foods don't agree, I do not want to cook for her ".  She does crave ice cream and other sweets Patient have trouble swallowing?  She has a history of chronic dysphagia due to GERD-hiatal hernia requiring dilatation Does the patient cook?  Any kitchen accidents such as leaving the stove on? Patient denies   Any headaches?  denies   Chronic back pain?  denies   Ambulates with difficulty? She walks outside  3 days a week. Arthritis and gout causes some pain Recent falls or head injuries? denies     Vision changes? She has a history of  Glaucoma, L cataract but they do not affect her ADLs. Unilateral weakness, numbness or tingling?  denies   Any tremors?  denies   Any anosmia?  denies   Any incontinence of urine? Endorsed, she has urge incontinence, needs nDepends  Any bowel dysfunction?    She has a history of chronic constipation, followed by GI, on Linzess Patient lives   daughter History of heavy alcohol intake? denies   History of heavy tobacco use? Denies,  quit 5 y ago  Family history of dementia?   no  Does patient drive? no   [id number] is [id number], A1C 6.3, TSH nl.  Allergies  Allergen Reactions   Alendronate Sodium Other (See Comments)   Hydromorphone Hcl     REACTION: itching   Naproxen Sodium Itching   Sulfonamide Derivatives     Dizziness   Tramadol Hives   Tramadol Hcl Other (See Comments)    Bloated and constipation    Naproxen Rash    Current Outpatient Medications  Medication Instructions   albuterol (VENTOLIN HFA) 108 (90 Base) MCG/ACT inhaler 2 puffs, Every 4 hours PRN   allopurinol (ZYLOPRIM) 200 mg, Oral, Daily   amLODipine (NORVASC) 10 MG tablet TAKE 1 TABLET(10 MG) BY MOUTH DAILY   apixaban (ELIQUIS) 5 MG TABS tablet TAKE 1 TABLET(5 MG) BY MOUTH TWICE DAILY   baclofen (LIORESAL) 10 MG tablet TAKE 1 TABLET(10 MG) BY MOUTH THREE TIMES DAILY AS NEEDED FOR MUSCLE SPASMS   benazepril (LOTENSIN) 20 MG tablet TAKE 1 AND 1/2 TABLETS BY MOUTH EVERY DAY   diazepam (VALIUM) 5 MG tablet Take 1 tab prior to the MRI, may have a second tab as needed   furosemide (LASIX) 20 mg, Oral, Daily, Take one 20mg  tablet daily. May take an extra tablet daily as needed for swelling or weight gain of 2 pounds overnight or 5 pounds in one week.   gabapentin (NEURONTIN) 200 mg, Oral, Daily   Homeopathic Products (THERAWORX RELIEF) FOAM See admin instructions   linaclotide (LINZESS) 145 mcg, Oral, Daily   pantoprazole (PROTONIX) 40 mg, Oral, 2 times daily     VITALS:   Vitals:   12/25/22 0951  BP: (!) 148/80  Pulse: 78  Resp: 20  SpO2: 98%  Height: 5\' 1"  (1.549 m)      10/18/2017    8:05 AM  Depression screen PHQ 2/9  Decreased Interest 0  Down, Depressed, Hopeless 0  PHQ - 2 Score 0    PHYSICAL EXAM   HEENT:  Normocephalic, atraumatic. The mucous membranes are moist. The superficial temporal arteries are without ropiness or tenderness. Cardiovascular: Regular rate and rhythm. Lungs: Clear to auscultation bilaterally. Neck: There are  no carotid bruits noted bilaterally.  NEUROLOGICAL:    12/25/2022   12:00 PM  Montreal Cognitive Assessment   Visuospatial/ Executive (0/5) 1  Naming (0/3) 3  Attention: Read list of digits (0/2) 1  Attention: Read list of letters (0/1) 1  Attention: Serial 7 subtraction starting at 100 (0/3) 2  Language: Repeat phrase (0/2) 1  Language : Fluency (0/1) 0  Abstraction (0/2) 1  Delayed Recall (0/5) 4  Orientation (0/6) 5  Total 19  Adjusted Score (based on education) 20        No data to display           Orientation:  Alert and oriented to person, place and time. No aphasia or dysarthria. Fund of knowledge is appropriate. Recent memory impaired and remote memory intact.  Attention and concentration are impaired.  Able to name objects and repeat phrases. Delayed recall 4/5 /  Cranial nerves: There is good facial symmetry. Extraocular muscles are intact and visual fields are full to confrontational testing. Speech is fluent and clear.  Hearing is intact to conversational tone.  Tone: Tone is good throughout. Sensation: Sensation is intact to light touch and pinprick throughout. Vibration is intact at the bilateral big toe.There is no extinction with double simultaneous stimulation. There is no sensory dermatomal level identified. Coordination: The patient has no difficulty with RAM's or FNF bilaterally. Normal finger to nose  Motor: Strength is 5/5 in the bilateral upper and lower extremities. There is no pronator drift. There are no fasciculations noted. DTR's: Deep tendon reflexes are 2/4 at the bilateral biceps, triceps, brachioradialis, patella and achilles.  Plantar responses are downgoing bilaterally. Gait and Station: The patient is able to ambulate without difficulty.The patient is able to ambulate in a tandem fashion, able to stand in the Romberg position.     Thank you for allowing [place] the opportunity to participate in the care of this nice patient. Please do not hesitate  to contact us for any questions or concerns.   Total time spent on today's visit was 60 minutes dedicated to this patient today, preparing to see patient, examining the patient, ordering tests and/or medications and counseling the patient, documenting clinical information in the EHR or other health record, independently interpreting results and communicating results to the patient/family, discussing treatment and goals, answering patient's questions and coordinating care.  Cc:  [name], MD  Sharene Butters 12/25/2022 12:07 PM

## 2022-12-25 NOTE — Telephone Encounter (Signed)
Called and spoke with daughter.  Explained dose reduction to Eliquis 2.5mg  twice daily.  She verbalized understanding and appreciated call. Rx sent to pharmacy.

## 2023-01-01 DIAGNOSIS — E538 Deficiency of other specified B group vitamins: Secondary | ICD-10-CM | POA: Diagnosis not present

## 2023-01-01 DIAGNOSIS — R413 Other amnesia: Secondary | ICD-10-CM | POA: Diagnosis not present

## 2023-01-03 DIAGNOSIS — H40023 Open angle with borderline findings, high risk, bilateral: Secondary | ICD-10-CM | POA: Diagnosis not present

## 2023-01-04 ENCOUNTER — Other Ambulatory Visit: Payer: Self-pay

## 2023-01-04 MED ORDER — BENAZEPRIL HCL 20 MG PO TABS: 30.0000 mg | ORAL_TABLET | Freq: Every day | ORAL | 0 refills | Status: DC

## 2023-01-08 DIAGNOSIS — E538 Deficiency of other specified B group vitamins: Secondary | ICD-10-CM | POA: Diagnosis not present

## 2023-01-09 ENCOUNTER — Ambulatory Visit: Payer: Medicare Other | Admitting: Podiatry

## 2023-01-10 ENCOUNTER — Other Ambulatory Visit: Payer: Self-pay | Admitting: Orthopaedic Surgery

## 2023-01-14 ENCOUNTER — Ambulatory Visit: Payer: Medicare Other | Admitting: Orthopaedic Surgery

## 2023-01-14 ENCOUNTER — Other Ambulatory Visit (INDEPENDENT_AMBULATORY_CARE_PROVIDER_SITE_OTHER): Payer: Medicare Other

## 2023-01-14 ENCOUNTER — Encounter: Payer: Self-pay | Admitting: Orthopaedic Surgery

## 2023-01-14 VITALS — BP 146/72 | HR 59 | Ht 61.0 in | Wt 168.0 lb

## 2023-01-14 DIAGNOSIS — G8929 Other chronic pain: Secondary | ICD-10-CM | POA: Diagnosis not present

## 2023-01-14 DIAGNOSIS — M545 Low back pain, unspecified: Secondary | ICD-10-CM

## 2023-01-14 DIAGNOSIS — M48062 Spinal stenosis, lumbar region with neurogenic claudication: Secondary | ICD-10-CM | POA: Diagnosis not present

## 2023-01-14 NOTE — Progress Notes (Signed)
Office Visit Note   Patient: Kristin Davidson           Date of Birth: 03/03/1941           MRN: [id number] Visit Date: 01/14/2023              Requested by: Renford Dills, MD [address] [place],  [place] [address] PCP: Renford Dills, MD   Assessment & Plan: Visit Diagnoses:  1. Chronic bilateral low back pain, unspecified whether sciatica present   2. Spinal stenosis of lumbar region with neurogenic claudication     Plan: Patient has spinal stenosis.  Patient states she does not want to consider surgery.  She got 1 month relief with epidural if she has significant increase in symptoms wants to try the epidural again she will call us but she have to stop the Eliquis short-term and then restarted after the epidural.  We discussed lumbar decompression.  Flexion-extension x-rays shows a 1 to 2 mm anterolisthesis which is stable on flexion-extension films.  Follow-Up Instructions: No follow-ups on file.   Orders:  Orders Placed This Encounter  Procedures   XR Lumbar Spine Complete   No orders of the defined types were placed in this encounter.     Procedures: No procedures performed   Clinical Data: No additional findings.   Subjective: Chief Complaint  Patient presents with   Lower Back - Pain    HPI 82 year old female seen today with her daughter accompanying her.  She has lumbar spinal stenosis L4-5 had previous injection last August with relief for about a month.  She is on Eliquis for atrial fibs currently.  She is on gabapentin twice a day.  She also is on duloxetine 30 mg daily.  She had some baclofen in the past which seemed to help her to some degree.  She gets relief when she sits she is able to stand for 5 to 10 minutes and gets relief with sitting for 5 to 10 minutes and then can repeat.  Her daughter who is with her also has L4-5 stenosis probable instability and was not a candidate for fusion and is on pain management.  Patient is comfortable  when she is sitting.  She gets relief with supine position as well.  Additional problems include COPD paroxysmal atrial fib hypertension.  She has some memory problems on occasion and had a recent MRI results not available.  No chills or fever no associated bowel or bladder symptoms.  Review of Systems all other systems noncontributory to HPI.   Objective: Vital Signs: BP (!) 146/72   Pulse (!) 59   Ht 5\' 1"  (1.549 m)   Wt 168 lb (76.2 kg)   BMI 31.74 kg/m   Physical Exam Constitutional:      Appearance: She is well-developed.  HENT:     Head: Normocephalic.     Right Ear: External ear normal.     Left Ear: External ear normal. There is no impacted cerumen.  Eyes:     Pupils: Pupils are equal, round, and reactive to light.  Neck:     Thyroid: No thyromegaly.     Trachea: No tracheal deviation.  Cardiovascular:     Rate and Rhythm: Normal rate.  Pulmonary:     Effort: Pulmonary effort is normal.  Abdominal:     Palpations: Abdomen is soft.  Musculoskeletal:     Cervical back: No rigidity.  Skin:    General: Skin is warm and dry.  Neurological:     Mental Status: She is alert and oriented to person, place, and time.  Psychiatric:        Behavior: Behavior normal.     Ortho Exam negative logroll hips negative straight leg raising 90 degrees.  Ankle dorsiflexion plantarflexion is intact slow deliberate heel-toe gait.  Specialty Comments:  No specialty comments available.  Imaging: XR Lumbar Spine Complete  Result Date: 01/14/2023 AP lateral lumbar spine images and lateral flexion-extension lumbar images obtained and reviewed this shows some facet arthropathy 2 mm anterolisthesis stable on flexion-extension.  Negative for compression fracture. Impression: Lumbar degenerative anterolisthesis 2 mm stable on flexion-extension, L4-5 level.    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   Essential hypertension 08/06/2007   ALLERGIC RHINITIS 08/06/2007   GERD 08/06/2007   BREAST CYST 08/06/2007   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   Past Medical History:  Diagnosis Date   Allergy    Anxiety    Arthritis    Atrial fibrillation 04/2020   Cataract 05/02/2021   only R eye at this time   COPD (chronic obstructive pulmonary disease)    Diabetes mellitus without complication    GERD (gastroesophageal reflux disease)    Gout    Hypertension    Kidney disease    Lower back pain 05/02/2021   Neuromuscular disorder 05/02/2021   lower extremities due to PAD (left worse than right)    Family History  Problem Relation Age of Onset   Bone cancer Mother    Heart attack Father 42   Colon cancer Neg Hx    Esophageal cancer Neg Hx    Rectal cancer Neg Hx    Stomach cancer Neg Hx    Colon polyps Neg Hx     Past Surgical History:  Procedure Laterality Date   ABDOMINAL HYSTERECTOMY     ABDOMINAL SURGERY     BREAST EXCISIONAL BIOPSY Right >10+ yrs ago   benign   COLONOSCOPY     KNEE SURGERY     Social History   Occupational History   Not on file  Tobacco Use   Smoking status: Former    Packs/day: 1.00    Years: 59.00    Additional pack years: 0.00    Total pack years: 59.00    Types: Cigarettes    Quit date: 06/08/2018    Years since quitting: 4.6    Passive exposure: Past   Smokeless tobacco: Never  Vaping Use   Vaping Use: Never used  Substance and Sexual Activity    Alcohol use: Never   Drug use: Never   Sexual activity: Not on file

## 2023-01-15 ENCOUNTER — Other Ambulatory Visit: Payer: Self-pay | Admitting: Orthopaedic Surgery

## 2023-01-15 ENCOUNTER — Telehealth: Payer: Self-pay | Admitting: Orthopaedic Surgery

## 2023-01-15 NOTE — Telephone Encounter (Signed)
Patient was supposed to get Baclofen refill sent to [facility] on [address] please advise

## 2023-02-04 ENCOUNTER — Other Ambulatory Visit: Payer: Self-pay | Admitting: Orthopaedic Surgery

## 2023-02-18 ENCOUNTER — Ambulatory Visit (INDEPENDENT_AMBULATORY_CARE_PROVIDER_SITE_OTHER): Payer: Medicare Other | Admitting: Physician Assistant

## 2023-02-18 ENCOUNTER — Other Ambulatory Visit: Payer: Self-pay | Admitting: Orthopaedic Surgery

## 2023-02-18 ENCOUNTER — Encounter: Payer: Self-pay | Admitting: Physician Assistant

## 2023-02-18 VITALS — BP 119/58 | HR 60 | Ht 61.0 in | Wt 168.0 lb

## 2023-02-18 DIAGNOSIS — R413 Other amnesia: Secondary | ICD-10-CM | POA: Diagnosis not present

## 2023-02-18 NOTE — Progress Notes (Signed)
Assessment/Plan:   Memory impairment  Kristin Davidson is a very pleasant 82 y.o. RH female  with a history of hypertension, hyperlipidemia, GERD with dysphagia, A-fib on Eliquis, COPD, Prediabetes, gout, arthritis, depression, anxiety seen today in follow up to discuss the MRI of the brain results, performed at [facility]. Unfortunately, the disc is not here for review, but the report is remarkable for normal for age volume loss and without disproportionate hypoccampal volume loss.  This patient is accompanied in the office by her daughter  who supplements the history.  Previous records as well as any outside records available were reviewed prior to todays visit.Last MoCa on 12/25/22 was 20/30.    Follow up in  6 months. Continue to control mood as per PCP Recommend good control of cardiovascular risk factors Continue to replenish B12 Recommend Neuropsych testing for clarity of diagnosis and to determine other causes of memory loss.   Initial evaluation 12/25/2022   How long did patient have memory difficulties? For at least 7 .  However, after the patient has moved with her daughter, she noticed that her mother was having difficulty remembering recent conversations, names of people, or instructions.  "Sometimes she thinks that she told me something but she never did "-daughter says. repeats oneself?  Endorsed by daughter, she may be asking the same question or telling the same story. Disoriented when walking into a room?   "Sometimes she cannot remember what she is doing and walking around in a circle ""we go to the bank she said why are we going here? I never been here before "(is always the same bank that they have been going for a long time).   Leaving objects in unusual places? Endorsed" she puts things that do not belong "  Wandering behavior? denies   Any personality changes ? Endorsed by her daughter.  She may be lying down, and then she has a spurt of energy and then she goes back to bed    Any history of depression?:  Endorsed.  Gets frustrated more often. Hallucinations or paranoia?  denies   Seizures? denies    Any sleep changes?  Endorsed, she is frequent leg cramps at night.  She has a history of peripheral vascular disease, and this is being followed that by her cardiologist, which may be contributing to some of her symptoms.  In addition, she does not drink enough water.  She does not have iron deficiency anemia.  Denies vivid dreams, REM behavior or sleepwalking.    Sleep apnea? denies  Endorsed, but she refuses to use the CPAP Any hygiene concerns?  denies   Independent of bathing and dressing?  Endorsed  Does the patient need help with medications?  Daughter oversees, but tries to allow her to be in charge of things. Who is in charge of the finances? Patient  is in charge "there is something you cannot do is touch her wallet on her pocketbook ". Any changes in appetite?" She eats and she will say it kept her up all night, some foods don't agree, I do not want to cook for her ".  She does crave ice cream and other sweets Patient have trouble swallowing?  She has a history of chronic dysphagia due to GERD-hiatal hernia requiring dilatation Does the patient cook?  Any kitchen accidents such as leaving the stove on? Patient denies   Any headaches?  denies   Chronic back pain?  denies   Ambulates with difficulty? She walks outside 3  days a week. Arthritis and gout causes some pain Recent falls or head injuries? denies     Vision changes? She has a history of  Glaucoma, L cataract but they do not affect her ADLs. Unilateral weakness, numbness or tingling?  denies   Any tremors?  denies   Any anosmia?  denies   Any incontinence of urine? Endorsed, she has urge incontinence, needs nDepends  Any bowel dysfunction?    She has a history of chronic constipation, followed by GI, on Linzess Patient lives   daughter History of heavy alcohol intake? denies   History of heavy  tobacco use? Denies, quit 5 y ago  Family history of dementia?   no  Does patient drive? no    Z61 is 096, E4V 6.3, TSH nl.  CURRENT MEDICATIONS:  Outpatient Encounter Medications as of 02/18/2023  Medication Sig   albuterol (VENTOLIN HFA) 108 (90 Base) MCG/ACT inhaler Inhale 2 puffs into the lungs every 4 (four) hours as needed. (Patient not taking: Reported on 08/14/2022)   allopurinol (ZYLOPRIM) 100 MG tablet Take 200 mg by mouth daily.   amLODipine (NORVASC) 10 MG tablet TAKE 1 TABLET(10 MG) BY MOUTH DAILY   apixaban (ELIQUIS) 2.5 MG TABS tablet Take 1 tablet (2.5 mg total) by mouth 2 (two) times daily.   baclofen (LIORESAL) 10 MG tablet TAKE 1 TABLET(10 MG) BY MOUTH THREE TIMES DAILY AS NEEDED FOR MUSCLE SPASMS   benazepril (LOTENSIN) 20 MG tablet Take 1.5 tablets (30 mg total) by mouth daily.   diazepam (VALIUM) 5 MG tablet Take 1 tab prior to the MRI, may have a second tab as needed   furosemide (LASIX) 20 MG tablet Take 1 tablet (20 mg total) by mouth daily. Take one 20mg  tablet daily. May take an extra tablet daily as needed for swelling or weight gain of 2 pounds overnight or 5 pounds in one week.   gabapentin (NEURONTIN) 100 MG capsule Take 200 mg by mouth daily.   Homeopathic Products (THERAWORX RELIEF) FOAM See admin instructions.   linaclotide (LINZESS) 145 MCG CAPS capsule Take 1 capsule (145 mcg total) by mouth daily.   pantoprazole (PROTONIX) 40 MG tablet Take 1 tablet (40 mg total) by mouth 2 (two) times daily.   No facility-administered encounter medications on file as of 02/18/2023.        No data to display            12/25/2022   12:00 PM  Montreal Cognitive Assessment   Visuospatial/ Executive (0/5) 1  Naming (0/3) 3  Attention: Read list of digits (0/2) 1  Attention: Read list of letters (0/1) 1  Attention: Serial 7 subtraction starting at 100 (0/3) 2  Language: Repeat phrase (0/2) 1  Language : Fluency (0/1) 0  Abstraction (0/2) 1  Delayed Recall (0/5)  4  Orientation (0/6) 5  Total 19  Adjusted Score (based on education) 20   Thank you for allowing [place] the opportunity to participate in the care of this nice patient. Please do not hesitate to contact us for any questions or concerns.   Total time spent on today's visit was 22 minutes dedicated to this patient today, preparing to see patient, examining the patient, ordering tests and/or medications and counseling the patient, documenting clinical information in the EHR or other health record, independently interpreting results and communicating results to the patient/family, discussing treatment and goals, answering patient's questions and coordinating care.  Cc:  Renford Dills, MD  Marlowe Kays 02/18/2023 6:38 AM

## 2023-02-18 NOTE — Patient Instructions (Signed)
It was a pleasure to see you today at our office.   Recommendations:  Follow up in 6 months Continue B12 supplements  Consider psychotherapy, walking program  Neurocognitive testing Increase socialization   Whom to call:  Memory  decline, memory medications: Call our office [phone]   For psychiatric meds, mood meds: Please have your primary care physician manage these medications.       For assessment of decision of mental capacity and competency:  Call Dr. Erick Blinks, geriatric psychiatrist at [phone]  For guidance in geriatric dementia issues please call [facility] [phone]    If you have any severe symptoms of a stroke, or other severe issues such as confusion,severe chills or fever, etc call 911 or go to the ER as you may need to be evaluated further   Feel free to visit Facebook page " [email or web address]" for tips of how to care for people with memory problems.       RECOMMENDATIONS FOR ALL PATIENTS WITH MEMORY PROBLEMS: 1. Continue to exercise (Recommend 30 minutes of walking everyday, or 3 hours every week) 2. Increase social interactions - continue going to [place] [facility] and enjoy social gatherings with friends and family 3. Eat healthy, avoid fried foods and eat more fruits and vegetables 4. Maintain adequate blood pressure, blood sugar, and blood cholesterol level. Reducing the risk of stroke and cardiovascular disease also helps promoting better memory. 5. Avoid stressful situations. Live a simple life and avoid aggravations. Organize your time and prepare for the next day in anticipation. 6. Sleep well, avoid any interruptions of sleep and avoid any distractions in the bedroom that may interfere with adequate sleep quality 7. Avoid sugar, avoid sweets as there is a strong link between excessive sugar intake, diabetes, and cognitive impairment We discussed the Mediterranean diet, which has been shown to help patients reduce the risk of progressive memory  disorders and reduces cardiovascular risk. This includes eating fish, eat fruits and green leafy vegetables, nuts like almonds and hazelnuts, walnuts, and also use olive oil. Avoid fast foods and fried foods as much as possible. Avoid sweets and sugar as sugar use has been linked to worsening of memory function.  There is always a concern of gradual progression of memory problems. If this is the case, then we may need to adjust level of care according to patient needs. Support, both to the patient and caregiver, should then be put into place.         FALL PRECAUTIONS: Be cautious when walking. Scan the area for obstacles that may increase the risk of trips and falls. When getting up in the mornings, sit up at the edge of the bed for a few minutes before getting out of bed. Consider elevating the bed at the head end to avoid drop of blood pressure when getting up. Walk always in a well-lit room (use night lights in the walls). Avoid area rugs or power cords from appliances in the middle of the walkways. Use a walker or a cane if necessary and consider physical therapy for balance exercise. Get your eyesight checked regularly.  FINANCIAL OVERSIGHT: Supervision, especially oversight when making financial decisions or transactions is also recommended.  HOME SAFETY: Consider the safety of the kitchen when operating appliances like stoves, microwave oven, and blender. Consider having supervision and share cooking responsibilities until no longer able to participate in those. Accidents with firearms and other hazards in the house should be identified and addressed as well.   ABILITY TO  BE LEFT ALONE: If patient is unable to contact 911 operator, consider using LifeLine, or when the need is there, arrange for someone to stay with patients. Smoking is a fire hazard, consider supervision or cessation. Risk of wandering should be assessed by caregiver and if detected at any point, supervision and safe proof  recommendations should be instituted.  MEDICATION SUPERVISION: Inability to self-administer medication needs to be constantly addressed. Implement a mechanism to ensure safe administration of the medications.   DRIVING: Regarding driving, in patients with progressive memory problems, driving will be impaired. We advise to have someone else do the driving if trouble finding directions or if minor accidents are reported. Independent driving assessment is available to determine safety of driving.   If you are interested in the driving assessment, you can contact the following:  [employer] in [place] [phone]  [facility] [phone]  [facility] [phone] or [phone]    Mediterranean Diet A Mediterranean diet refers to food and lifestyle choices that are based on the traditions of countries located on the [employer]. This way of eating has been shown to help prevent certain conditions and improve outcomes for people who have chronic diseases, like kidney disease and heart disease. What are tips for following this plan? Lifestyle  Cook and eat meals together with your family, when possible. Drink enough fluid to keep your urine clear or pale yellow. Be physically active every day. This includes: Aerobic exercise like running or swimming. Leisure activities like gardening, walking, or housework. Get 7-8 hours of sleep each night. If recommended by your health care provider, drink red wine in moderation. This means 1 glass a day for nonpregnant women and 2 glasses a day for men. A glass of wine equals 5 oz (150 mL). Reading food labels  Check the serving size of packaged foods. For foods such as rice and pasta, the serving size refers to the amount of cooked product, not dry. Check the total fat in packaged foods. Avoid foods that have saturated fat or trans fats. Check the ingredients list for  added sugars, such as corn syrup. Shopping  At the grocery store, buy most of your food from the areas near the walls of the store. This includes: Fresh fruits and vegetables (produce). Grains, beans, nuts, and seeds. Some of these may be available in unpackaged forms or large amounts (in bulk). Fresh seafood. Poultry and eggs. Low-fat dairy products. Buy whole ingredients instead of prepackaged foods. Buy fresh fruits and vegetables in-season from local farmers markets. Buy frozen fruits and vegetables in resealable bags. If you do not have access to quality fresh seafood, buy precooked frozen shrimp or canned fish, such as tuna, salmon, or sardines. Buy small amounts of raw or cooked vegetables, salads, or olives from the deli or salad bar at your store. Stock your pantry so you always have certain foods on hand, such as olive oil, canned tuna, canned tomatoes, rice, pasta, and beans. Cooking  Cook foods with extra-virgin olive oil instead of using butter or other vegetable oils. Have meat as a side dish, and have vegetables or grains as your main dish. This means having meat in small portions or adding small amounts of meat to foods like pasta or stew. Use beans or vegetables instead of meat in common dishes like chili or lasagna. Experiment with different cooking methods. Try roasting or broiling vegetables instead of steaming or sauteing them. Add frozen vegetables to soups,  stews, pasta, or rice. Add nuts or seeds for added healthy fat at each meal. You can add these to yogurt, salads, or vegetable dishes. Marinate fish or vegetables using olive oil, lemon juice, garlic, and fresh herbs. Meal planning  Plan to eat 1 vegetarian meal one day each week. Try to work up to 2 vegetarian meals, if possible. Eat seafood 2 or more times a week. Have healthy snacks readily available, such as: Vegetable sticks with hummus. Greek yogurt. Fruit and nut trail mix. Eat balanced meals throughout  the week. This includes: Fruit: 2-3 servings a day Vegetables: 4-5 servings a day Low-fat dairy: 2 servings a day Fish, poultry, or lean meat: 1 serving a day Beans and legumes: 2 or more servings a week Nuts and seeds: 1-2 servings a day Whole grains: 6-8 servings a day Extra-virgin olive oil: 3-4 servings a day Limit red meat and sweets to only a few servings a month What are my food choices? Mediterranean diet Recommended Grains: Whole-grain pasta. Brown rice. Bulgar wheat. Polenta. Couscous. Whole-wheat bread. Orpah Cobb. Vegetables: Artichokes. Beets. Broccoli. Cabbage. Carrots. Eggplant. Green beans. Chard. Kale. Spinach. Onions. Leeks. Peas. Squash. Tomatoes. Peppers. Radishes. Fruits: Apples. Apricots. Avocado. Berries. Bananas. Cherries. Dates. Figs. Grapes. Lemons. Melon. Oranges. Peaches. Plums. Pomegranate. Meats and other protein foods: Beans. Almonds. Sunflower seeds. Pine nuts. Peanuts. Cod. Salmon. Scallops. Shrimp. Tuna. Tilapia. Clams. Oysters. Eggs. Dairy: Low-fat milk. Cheese. Greek yogurt. Beverages: Water. Red wine. Herbal tea. Fats and oils: Extra virgin olive oil. Avocado oil. Grape seed oil. Sweets and desserts: [place] yogurt with honey. Baked apples. Poached pears. Trail mix. Seasoning and other foods: Basil. Cilantro. Coriander. Cumin. Mint. Parsley. Sage. Rosemary. Tarragon. Garlic. Oregano. Thyme. Pepper. Balsalmic vinegar. Tahini. Hummus. Tomato sauce. Olives. Mushrooms. Limit these Grains: Prepackaged pasta or rice dishes. Prepackaged cereal with added sugar. Vegetables: Deep fried potatoes (french fries). Fruits: Fruit canned in syrup. Meats and other protein foods: Beef. Pork. Lamb. Poultry with skin. Hot dogs. Tomasa Blase. Dairy: Ice cream. Sour cream. Whole milk. Beverages: Juice. Sugar-sweetened soft drinks. Beer. Liquor and spirits. Fats and oils: Butter. Canola oil. Vegetable oil. Beef fat (tallow). Lard. Sweets and desserts: Cookies. Cakes. Pies.  Candy. Seasoning and other foods: Mayonnaise. Premade sauces and marinades. The items listed may not be a complete list. Talk with your dietitian about what dietary choices are right for you. Summary The Mediterranean diet includes both food and lifestyle choices. Eat a variety of fresh fruits and vegetables, beans, nuts, seeds, and whole grains. Limit the amount of red meat and sweets that you eat. Talk with your health care provider about whether it is safe for you to drink red wine in moderation. This means 1 glass a day for nonpregnant women and 2 glasses a day for men. A glass of wine equals 5 oz (150 mL). This information is not intended to replace advice given to you by your health care provider. Make sure you discuss any questions you have with your health care provider. Document Released: 05/17/2016 Document Revised: 06/19/2016 Document Reviewed: 05/17/2016 Elsevier Interactive Patient Education  2017 [employer].

## 2023-02-27 ENCOUNTER — Other Ambulatory Visit: Payer: Self-pay | Admitting: Cardiology

## 2023-02-27 NOTE — Telephone Encounter (Signed)
Please make an appt for future refills Thank you 2nd attempt

## 2023-03-08 ENCOUNTER — Encounter: Payer: Self-pay | Admitting: Podiatry

## 2023-03-08 ENCOUNTER — Ambulatory Visit (INDEPENDENT_AMBULATORY_CARE_PROVIDER_SITE_OTHER): Payer: Medicare Other | Admitting: Podiatry

## 2023-03-08 DIAGNOSIS — M79674 Pain in right toe(s): Secondary | ICD-10-CM

## 2023-03-08 DIAGNOSIS — B351 Tinea unguium: Secondary | ICD-10-CM

## 2023-03-08 DIAGNOSIS — N1831 Chronic kidney disease, stage 3a: Secondary | ICD-10-CM

## 2023-03-08 DIAGNOSIS — M79675 Pain in left toe(s): Secondary | ICD-10-CM

## 2023-03-08 NOTE — Progress Notes (Signed)
This patient returns to my office for at risk foot care.  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  She says she has PAD.  This patient is unable to cut nails herself since the patient cannot reach her nails.These nails are painful walking and wearing shoes.  This patient presents for at risk foot care today.  General Appearance  Alert, conversant and in no acute stress.  Vascular  Dorsalis pedis and posterior tibial  pulses are palpable  bilaterally.  Capillary return is within normal limits  bilaterally. Temperature is within normal limits  bilaterally. Absent digital hair.  Neurologic  Senn-Weinstein monofilament wire test within normal limits  bilaterally. Muscle power within normal limits bilaterally.  Nails Thick disfigured discolored nails with subungual debris  from hallux to fifth toes bilaterally. No evidence of bacterial infection or drainage bilaterally.  Orthopedic  No limitations of motion  feet .  No crepitus or effusions noted.  No bony pathology or digital deformities noted.  HAV  B/L.  Skin  normotropic skin with no porokeratosis noted bilaterally.  No signs of infections or ulcers noted.     Onychomycosis  Pain in right toes  Pain in left toes  Consent was obtained for treatment procedures.   Mechanical debridement of nails 1-5  bilaterally performed with a nail nipper.  Filed with dremel without incident.    Return office visit    3 months                  Told patient to return for periodic foot care and evaluation due to potential at risk complications.   Starlynn Klinkner DPM   

## 2023-03-11 ENCOUNTER — Other Ambulatory Visit: Payer: Self-pay | Admitting: Orthopaedic Surgery

## 2023-03-11 DIAGNOSIS — K5900 Constipation, unspecified: Secondary | ICD-10-CM | POA: Diagnosis not present

## 2023-03-11 DIAGNOSIS — I480 Paroxysmal atrial fibrillation: Secondary | ICD-10-CM | POA: Diagnosis not present

## 2023-03-11 DIAGNOSIS — M48061 Spinal stenosis, lumbar region without neurogenic claudication: Secondary | ICD-10-CM | POA: Diagnosis not present

## 2023-03-11 DIAGNOSIS — N1831 Chronic kidney disease, stage 3a: Secondary | ICD-10-CM | POA: Diagnosis not present

## 2023-03-11 DIAGNOSIS — Z23 Encounter for immunization: Secondary | ICD-10-CM | POA: Diagnosis not present

## 2023-03-11 DIAGNOSIS — E1169 Type 2 diabetes mellitus with other specified complication: Secondary | ICD-10-CM | POA: Diagnosis not present

## 2023-03-11 DIAGNOSIS — Z0000 Encounter for general adult medical examination without abnormal findings: Secondary | ICD-10-CM | POA: Diagnosis not present

## 2023-03-11 DIAGNOSIS — Z1389 Encounter for screening for other disorder: Secondary | ICD-10-CM | POA: Diagnosis not present

## 2023-03-11 DIAGNOSIS — I130 Hypertensive heart and chronic kidney disease with heart failure and stage 1 through stage 4 chronic kidney disease, or unspecified chronic kidney disease: Secondary | ICD-10-CM | POA: Diagnosis not present

## 2023-03-11 DIAGNOSIS — E7800 Pure hypercholesterolemia, unspecified: Secondary | ICD-10-CM | POA: Diagnosis not present

## 2023-03-11 DIAGNOSIS — E1122 Type 2 diabetes mellitus with diabetic chronic kidney disease: Secondary | ICD-10-CM | POA: Diagnosis not present

## 2023-03-11 DIAGNOSIS — I5032 Chronic diastolic (congestive) heart failure: Secondary | ICD-10-CM | POA: Diagnosis not present

## 2023-03-13 ENCOUNTER — Other Ambulatory Visit: Payer: Self-pay | Admitting: Cardiology

## 2023-03-13 ENCOUNTER — Other Ambulatory Visit: Payer: Self-pay | Admitting: Orthopaedic Surgery

## 2023-03-22 ENCOUNTER — Other Ambulatory Visit: Payer: Self-pay | Admitting: *Deleted

## 2023-03-22 MED ORDER — PANTOPRAZOLE SODIUM 40 MG PO TBEC: 40.0000 mg | DELAYED_RELEASE_TABLET | Freq: Two times a day (BID) | ORAL | 5 refills | Status: DC

## 2023-03-25 ENCOUNTER — Other Ambulatory Visit: Payer: Self-pay | Admitting: Orthopaedic Surgery

## 2023-04-09 ENCOUNTER — Other Ambulatory Visit: Payer: Self-pay | Admitting: Orthopaedic Surgery

## 2023-04-09 ENCOUNTER — Other Ambulatory Visit: Payer: Self-pay | Admitting: Cardiology

## 2023-04-09 NOTE — Telephone Encounter (Signed)
Last refill. After this she can get from PCP

## 2023-04-25 ENCOUNTER — Other Ambulatory Visit: Payer: Self-pay | Admitting: Cardiology

## 2023-05-01 ENCOUNTER — Other Ambulatory Visit: Payer: Self-pay | Admitting: Orthopaedic Surgery

## 2023-05-01 ENCOUNTER — Other Ambulatory Visit: Payer: Self-pay | Admitting: Cardiology

## 2023-05-07 ENCOUNTER — Telehealth: Payer: Self-pay | Admitting: Cardiology

## 2023-05-07 ENCOUNTER — Other Ambulatory Visit: Payer: Self-pay | Admitting: Cardiology

## 2023-05-07 MED ORDER — AMLODIPINE BESYLATE 10 MG PO TABS: 10.0000 mg | ORAL_TABLET | Freq: Every day | ORAL | 0 refills | Status: DC

## 2023-05-07 MED ORDER — BENAZEPRIL HCL 20 MG PO TABS: 30.0000 mg | ORAL_TABLET | Freq: Every day | ORAL | 0 refills | Status: DC

## 2023-05-07 NOTE — Telephone Encounter (Signed)
Patient's daughter is calling to follow up on refill to confirm it will be sent to the pharmacy today to last her until 09/03 appt. Please advise.

## 2023-05-07 NOTE — Telephone Encounter (Signed)
*  STAT* If patient is at the pharmacy, call can be transferred to refill team.   1. Which medications need to be refilled? (please list name of each medication and dose if known) amLODipine (NORVASC) 10 MG tablet   2. Which pharmacy/location (including street and city if local pharmacy) is medication to be sent to? [id number] - [place], [place] - [address] AT [address]    3. Do they need a 30 day or 90 day supply? 30 day

## 2023-05-07 NOTE — Telephone Encounter (Signed)
Patient of Dr. Schumann. Please review for refill. Thank you!  

## 2023-05-07 NOTE — Telephone Encounter (Signed)
Called patient and confirmed pharmacy and sent over to pharmacy for patient to pick up tomorrow.

## 2023-05-08 ENCOUNTER — Other Ambulatory Visit: Payer: Self-pay

## 2023-05-08 MED ORDER — AMLODIPINE BESYLATE 10 MG PO TABS: 10.0000 mg | ORAL_TABLET | Freq: Every day | ORAL | 0 refills | Status: DC

## 2023-05-08 MED ORDER — BENAZEPRIL HCL 20 MG PO TABS: 30.0000 mg | ORAL_TABLET | Freq: Every day | ORAL | 0 refills | Status: DC

## 2023-05-08 NOTE — Addendum Note (Signed)
Addended by: Margaret Pyle D on: 05/08/2023 07:59 AM   Modules accepted: Orders

## 2023-05-17 DIAGNOSIS — H40023 Open angle with borderline findings, high risk, bilateral: Secondary | ICD-10-CM | POA: Diagnosis not present

## 2023-05-27 ENCOUNTER — Other Ambulatory Visit: Payer: Self-pay | Admitting: Orthopaedic Surgery

## 2023-06-06 ENCOUNTER — Other Ambulatory Visit: Payer: Self-pay | Admitting: Internal Medicine

## 2023-06-06 ENCOUNTER — Ambulatory Visit
Admission: RE | Admit: 2023-06-06 | Discharge: 2023-06-06 | Disposition: A | Discharge status: Home / Self Care | Payer: Medicare Other | Source: Ambulatory Visit | Attending: Internal Medicine | Admitting: Internal Medicine

## 2023-06-06 DIAGNOSIS — R109 Unspecified abdominal pain: Secondary | ICD-10-CM

## 2023-06-06 DIAGNOSIS — K529 Noninfective gastroenteritis and colitis, unspecified: Secondary | ICD-10-CM | POA: Diagnosis not present

## 2023-06-06 DIAGNOSIS — R197 Diarrhea, unspecified: Secondary | ICD-10-CM | POA: Diagnosis not present

## 2023-06-07 DIAGNOSIS — R8290 Unspecified abnormal findings in urine: Secondary | ICD-10-CM | POA: Diagnosis not present

## 2023-06-11 ENCOUNTER — Encounter (HOSPITAL_BASED_OUTPATIENT_CLINIC_OR_DEPARTMENT_OTHER): Payer: Self-pay | Admitting: Family

## 2023-06-11 ENCOUNTER — Ambulatory Visit (INDEPENDENT_AMBULATORY_CARE_PROVIDER_SITE_OTHER): Payer: Medicare Other | Admitting: Family

## 2023-06-11 VITALS — BP 136/64 | HR 50 | Ht 61.0 in | Wt 161.0 lb

## 2023-06-11 DIAGNOSIS — I10 Essential (primary) hypertension: Secondary | ICD-10-CM | POA: Diagnosis not present

## 2023-06-11 DIAGNOSIS — Z7901 Long term (current) use of anticoagulants: Secondary | ICD-10-CM

## 2023-06-11 DIAGNOSIS — I480 Paroxysmal atrial fibrillation: Secondary | ICD-10-CM

## 2023-06-11 DIAGNOSIS — I5189 Other ill-defined heart diseases: Secondary | ICD-10-CM | POA: Diagnosis not present

## 2023-06-11 DIAGNOSIS — R002 Palpitations: Secondary | ICD-10-CM

## 2023-06-11 MED ORDER — AMLODIPINE BESYLATE 10 MG PO TABS
10.0000 mg | ORAL_TABLET | Freq: Every day | ORAL | 3 refills | Status: DC
Start: 2023-06-11 — End: 2024-07-20

## 2023-06-11 MED ORDER — APIXABAN 2.5 MG PO TABS: 2.5000 mg | ORAL_TABLET | Freq: Two times a day (BID) | ORAL | 11 refills | Status: DC

## 2023-06-11 MED ORDER — BENAZEPRIL HCL 20 MG PO TABS
30.0000 mg | ORAL_TABLET | Freq: Every day | ORAL | 3 refills | Status: DC
Start: 2023-06-11 — End: 2024-08-05

## 2023-06-11 MED ORDER — FUROSEMIDE 20 MG PO TABS: 20.0000 mg | ORAL_TABLET | Freq: Every day | ORAL | 3 refills | Status: DC

## 2023-06-11 NOTE — Progress Notes (Signed)
Cardiology Office Note:  .   Date:  06/11/2023  ID:  Kristin Davidson, DOB [birth year], MRN [id number] PCP: Renford Dills, MD  [facility] Providers Cardiologist:  Little Ishikawa, MD    History of Present Illness: .   Kristin Davidson is a 82 y.o. female with a hx of HTN, COPD, CKD, atrial fibrillation, RBBB.   Previous admission 05/08/20 with chest pain with diagnosis of colitis. HS troponin 20>29>24>22>23>16. Echo with normal LVEF 60-65%, mild LVH, gr2DD, normal RV function, mild to moderate MR. Esophagitis was presumed cause of her chest pain. She had bradycardia with as low as 40s which was asymptomatic. She was discharged with cardiac event monitor showing 3% atrial fibrillation burden with average HR 130bpm and 2318 episodes of SVT, longest 53 sec. She was started on Eliquis 5mg  BID CHADS2VASC of 4.   She was seen by Dr. Bjorn Pippin 10/04/20 doing overall well from a cardiac perspective. She reported abdominal and chest pain which resolved when she ate. She was recommended for sleep study. Given elevated BP her Amlodipine was increased to 10mg  daily. Lexiscan myoview was ordered which was low risk study.    Seen in follow-up with her daughter 05/09/2021.  Clearance was provided for EGD.  Given lower extremity edema and hypertension Lasix was initiated.  She was seen by Dr. Allyson Sabal for workup of PAD which showed no PAD.   Last seen 01/10/2022.  Lower extremity and diastolic dysfunction were well-managed on Lasix 1 mg Tues/Sat and 20mg  every other day. Intermittent palpitations were noted, offered PRN Propranolol which she politely declined.    She presents today for follow up with her daughter. Does note recent difficulties with diarrhea. She notes she has been old she needs to drink more but does not like water. Has been using Pedialyte popsicles. Still with occasional leg cramps. Notes occasional palpitations which she attributes to her medications.  No chest pain, worsened dyspnea,  lightheadedness, dizziness. Leg elevation, physical activity limited by back difficulties.    03/11/23 A1c 6.7, total cholesterol 180, HLD 46, LDL 105 06/06/23 ALT 14, AST 18, GFR 26, K 4.1, creatinine 1.88  ROS: Please see the history of present illness.    All other systems reviewed and are negative.   Studies Reviewed: .        Cardiac Studies & Procedures     STRESS TESTS  MYOCARDIAL PERFUSION IMAGING 11/01/2020  Narrative  There was no ST segment deviation noted during stress.  The left ventricular ejection fraction is normal (55-65%).  Nuclear stress EF: 64%.  Defect 1: There is a small defect of mild severity present in the basal inferior, mid inferior and apical inferior location.  Defect 2: There is a small defect of mild severity present in the basal anteroseptal, mid anteroseptal and apical septal location.  The study is normal.  This is a low risk study.  1. Fixed inferior and anteroseptal perfusion defects with normal wall motion in these regions, suggesting artifact 2. Low risk study   ECHOCARDIOGRAM  ECHOCARDIOGRAM COMPLETE 05/09/2020  Narrative ECHOCARDIOGRAM REPORT    Kristin Davidson Date of Exam: 05/09/2020 Medical Rec #:  [id number]       Height:       61.0 in Accession #:    [id number]      Weight:       167.5 lb Date of Birth:  1941-03-27       BSA:  1.752 m Kristin Age:    78 years        BP:           179/76 mmHg Kristin Gender: F               HR:           47 bpm. Exam Location:  Inpatient  Procedure: 2D Echo and Intracardiac Opacification Agent  Indications:    Chest Pain 786.50 / R07.9  History:        Kristin has no prior history of Echocardiogram examinations. COPD, Arrythmias:RBBB; Risk Factors:Hypertension. Chronic kidney disease. GERD. Gout. abdominal pain, nausea and vomiting. Chest pain and cough.  Sonographer:    Leta Jungling RDCS Referring Phys: [id number] CAROLE N HALL  IMPRESSIONS   1. Left  ventricular ejection fraction, by estimation, is 60 to 65%. The left ventricle has normal function. The left ventricle has no regional wall motion abnormalities. There is mild left ventricular hypertrophy. Left ventricular diastolic parameters are consistent with Grade II diastolic dysfunction (pseudonormalization). 2. Right ventricular systolic function is normal. The right ventricular size is normal. Tricuspid regurgitation signal is inadequate for assessing PA pressure. 3. The mitral valve is normal in structure. Mild to moderate mitral valve regurgitation. No evidence of mitral stenosis. 4. The aortic valve is normal in structure. Aortic valve regurgitation is not visualized. No aortic stenosis is present. 5. The inferior vena cava is normal in size with greater than 50% respiratory variability, suggesting right atrial pressure of 3 mmHg.  FINDINGS Left Ventricle: Left ventricular ejection fraction, by estimation, is 60 to 65%. The left ventricle has normal function. The left ventricle has no regional wall motion abnormalities. Definity contrast agent was given IV to delineate the left ventricular endocardial borders. The left ventricular internal cavity size was normal in size. There is mild left ventricular hypertrophy. Left ventricular diastolic parameters are consistent with Grade II diastolic dysfunction (pseudonormalization).  Right Ventricle: The right ventricular size is normal. No increase in right ventricular wall thickness. Right ventricular systolic function is normal. Tricuspid regurgitation signal is inadequate for assessing PA pressure.  Left Atrium: Left atrial size was normal in size.  Right Atrium: Right atrial size was normal in size.  Pericardium: There is no evidence of pericardial effusion.  Mitral Valve: The mitral valve is normal in structure. Normal mobility of the mitral valve leaflets. Mild to moderate mitral valve regurgitation. No evidence of mitral valve  stenosis.  Tricuspid Valve: The tricuspid valve is normal in structure. Tricuspid valve regurgitation is mild . No evidence of tricuspid stenosis.  Aortic Valve: The aortic valve is normal in structure. Aortic valve regurgitation is not visualized. No aortic stenosis is present.  Pulmonic Valve: The pulmonic valve was not well visualized. Pulmonic valve regurgitation is not visualized. No evidence of pulmonic stenosis.  Aorta: The aortic root is normal in size and structure.  Venous: The inferior vena cava is normal in size with greater than 50% respiratory variability, suggesting right atrial pressure of 3 mmHg.  IAS/Shunts: No atrial level shunt detected by color flow Doppler.   LEFT VENTRICLE PLAX 2D LVIDd:         4.00 cm Diastology LVIDs:         3.30 cm LV e' lateral:   8.63 cm/s LV PW:         1.30 cm LV E/e' lateral: 12.1 LV IVS:        1.20 cm LV e' medial:    5.93 cm/s  LVOT diam:     1.80 cm LV E/e' medial:  17.5 LV SV:         53 LV SV Index:   30 LVOT Area:     2.54 cm   RIGHT VENTRICLE RV S prime:     12.50 cm/s TAPSE (M-mode): 2.0 cm  LEFT ATRIUM             Index       RIGHT ATRIUM           Index LA diam:        3.30 cm 1.88 cm/m  RA Area:     13.50 cm LA Vol (A2C):   46.9 ml 26.77 ml/m RA Volume:   31.80 ml  18.15 ml/m LA Vol (A4C):   53.3 ml 30.43 ml/m LA Biplane Vol: 51.8 ml 29.57 ml/m AORTIC VALVE LVOT Vmax:   90.80 cm/s LVOT Vmean:  60.600 cm/s LVOT VTI:    0.209 m  AORTA Ao Root diam: 2.70 cm  MITRAL VALVE MV Area (PHT): 3.77 cm     SHUNTS MV Decel Time: 201 msec     Systemic VTI:  0.21 m MV E velocity: 104.00 cm/s  Systemic Diam: 1.80 cm MV A velocity: 62.40 cm/s MV E/A ratio:  1.67  Weston Brass MD Electronically signed by Weston Brass MD Signature Date/Time: 05/09/2020/9:17:06 AM    Final    MONITORS  LONG TERM MONITOR-LIVE TELEMETRY (3-14 DAYS) 05/10/2020  Narrative  3% atrial fibrillation burden with average HR 130  bpm. Longest episode lasted 51 minutes.  2318 episodes of SVT, longest lasting 53 seconds.  Isolated atrial ectopy was frequent (8.5%) with occasional couplets (1.1%)  One 6 beat run of NSVT.  Second monitor showed no Afib and improvement in SVT (only 9 episodes over 3 days) and PAC frequency (2.2%)  First monitor (05/10/20 - 05/16/20): 6 days of data recorded on Zio monitor. Kristin had a min HR of 38 bpm, max HR of 218 bpm, and avg HR of 57 bpm. Predominant underlying rhythm was Sinus Rhythm. One 6 beat run of NSVT.  3% atrial fibrillation burden with average HR 130 bpm.  Longest episode lasted 51 minutes.  2318 episodes of SVT, longest lasting 53 seconds.  No high degree block or pauses noted.   Isolated ventricular ectopy was rare (<1%). Isolated atrial ectopy was frequent (8.5%) with occasional couplets (1.1%).  There were 4 triggered events, which corresponded to AF and sinus rhyth with PACs.  Second monitor (05/19/20 to 05/22/20): 3 days of data recorded on Zio monitor. Kristin had a min HR of 39 bpm, max HR of 176 bpm, and avg HR of 52 bpm. Predominant underlying rhythm was Sinus Rhythm. No VT, atrial fibrillation, high degree block, or pauses noted. 9 episodes of SVT, longest lasting 9 beats.  Isolated ventricular ectopy was rare (<1%). Isolated atrial ectopy was occasional (2.2%). There were 2 triggered events, corresponding to sinus rhythm with PACs.           Risk Assessment/Calculations:    CHA2DS2-VASc Score = 4   This indicates a 4.8% annual risk of stroke. The Kristin's score is based upon: CHF History: 0 HTN History: 1 Diabetes History: 0 Stroke History: 0 Vascular Disease History: 0 Age Score: 2 Gender Score: 1            Physical Exam:   VS:  BP 136/64   Pulse (!) 50   Ht 5\' 1"  (1.549 m)   Wt 161 lb (  73 kg)   SpO2 95%   BMI 30.42 kg/m    Wt Readings from Last 3 Encounters:  06/11/23 161 lb (73 kg)  02/18/23 168 lb (76.2 kg)  01/14/23 168 lb (76.2 kg)    GEN:  Well nourished, overweight, well developed in no acute distress NECK: No JVD; No carotid bruits CARDIAC: RRR, no murmurs, rubs, gallops RESPIRATORY:  Clear to auscultation without rales, wheezing or rhonchi  ABDOMEN: Soft, non-tender, non-distended EXTREMITIES:  No edema; No deformity   ASSESSMENT AND PLAN: .    LE edema / diastolic dysfunction - Well controlled on Furosemide 20mg  daily.  Refills provided.  HTN - BP mildly elevated in clinic. Hesitant to over treat given age, relative inactivity. Continue Amlodipine 10mg  dialy, Benazepril 30mg  daily.  Refills provided.  CKD - Careful titration of diuretic and antihypertensive.  Worsened in setting of recent diarrhea.  She has been encouraged to increase p.o. fluid intake.  If kidney function remains difficult to control could consider reducing her dose of benazepril to 20mg  versus reducing Lasix to 20 mg 3 times per week.  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 No anginal symptoms. Prior RBBB. No indication for further workup. Avoid AV nodal blocking agents.   Palpitations / PAF / Chronic anticoagulation - Reports rate palpitations. Avoid AV nodal blocking therapy due to bifascicular block. CHA2DS2-VASc Score = 4 [CHF History: 0, HTN History: 1, Diabetes History: 0, Stroke History: 0, Vascular Disease History: 0, Age Score: 2, Gender Score: 1].  Therefore, the Kristin's annual risk of stroke is 4.8 %.    Continue Eliquis 2.5mg  BID. Reduced dose due to age, renal function.        Dispo: follow up in 1 year  Signed, Alver Sorrow, NP

## 2023-06-11 NOTE — Patient Instructions (Signed)
Medication Instructions:  Continue your current medications.   *If you need a refill on your cardiac medications before your next appointment, please call your pharmacy*  Testing/Procedures: Your EKG looked good today.   Follow-Up: At [facility], you and your health needs are our priority.  As part of our continuing mission to provide you with exceptional heart care, we have created designated Provider Care Teams.  These Care Teams include your primary Cardiologist (physician) and Advanced Practice Providers (APPs -  Physician Assistants and Nurse Practitioners) who all work together to provide you with the care you need, when you need it.  We recommend signing up for the patient portal called "MyChart".  Sign up information is provided on this After Visit Summary.  MyChart is used to connect with patients for Virtual Visits (Telemedicine).  Patients are able to view lab/test results, encounter notes, upcoming appointments, etc.  Non-urgent messages can be sent to your provider as well.   To learn more about what you can do with MyChart, go to [email or web address].    Your next appointment:   1 year(s)  Provider:   Epifanio Lesches, MD or Gillian Shields, NP    Other Instructions  To prevent or reduce lower extremity swelling: Eat a low salt diet. Salt makes the body hold onto extra fluid which causes swelling. Sit with legs elevated. For example, in the recliner or on an ottoman.  Wear knee-high compression stockings during the daytime. Ones labeled 15-20 mmHg provide good compression.

## 2023-06-14 ENCOUNTER — Encounter: Payer: Self-pay | Admitting: Podiatry

## 2023-06-14 ENCOUNTER — Ambulatory Visit (INDEPENDENT_AMBULATORY_CARE_PROVIDER_SITE_OTHER): Payer: Medicare Other | Admitting: Podiatry

## 2023-06-14 DIAGNOSIS — M79674 Pain in right toe(s): Secondary | ICD-10-CM | POA: Diagnosis not present

## 2023-06-14 DIAGNOSIS — B351 Tinea unguium: Secondary | ICD-10-CM | POA: Diagnosis not present

## 2023-06-14 DIAGNOSIS — M79675 Pain in left toe(s): Secondary | ICD-10-CM | POA: Diagnosis not present

## 2023-06-14 DIAGNOSIS — D689 Coagulation defect, unspecified: Secondary | ICD-10-CM | POA: Diagnosis not present

## 2023-06-14 NOTE — Progress Notes (Signed)
This patient returns to my office for at risk foot care.  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  She says she has PAD.  This patient is unable to cut nails herself since the patient cannot reach her nails.These nails are painful walking and wearing shoes.  This patient presents for at risk foot care today.  General Appearance  Alert, conversant and in no acute stress.  Vascular  Dorsalis pedis and posterior tibial  pulses are palpable  bilaterally.  Capillary return is within normal limits  bilaterally. Temperature is within normal limits  bilaterally. Absent digital hair.  Neurologic  Senn-Weinstein monofilament wire test within normal limits  bilaterally. Muscle power within normal limits bilaterally.  Nails Thick disfigured discolored nails with subungual debris  from hallux to fifth toes bilaterally. No evidence of bacterial infection or drainage bilaterally.  Orthopedic  No limitations of motion  feet .  No crepitus or effusions noted.  No bony pathology or digital deformities noted.  HAV  B/L.  Skin  normotropic skin with no porokeratosis noted bilaterally.  No signs of infections or ulcers noted.     Onychomycosis  Pain in right toes  Pain in left toes  Consent was obtained for treatment procedures.   Mechanical debridement of nails 1-5  bilaterally performed with a nail nipper.  Filed with dremel without incident.    Return office visit    3 months                  Told patient to return for periodic foot care and evaluation due to potential at risk complications.   Gregory Mayer DPM   

## 2023-06-17 ENCOUNTER — Other Ambulatory Visit: Payer: Self-pay | Admitting: Orthopedic Surgery

## 2023-06-18 ENCOUNTER — Other Ambulatory Visit: Payer: Self-pay

## 2023-06-18 ENCOUNTER — Telehealth: Payer: Self-pay | Admitting: Orthopaedic Surgery

## 2023-06-18 MED ORDER — BACLOFEN 10 MG PO TABS: 10.0000 mg | ORAL_TABLET | Freq: Three times a day (TID) | ORAL | 0 refills | Status: DC | PRN

## 2023-06-18 NOTE — Telephone Encounter (Signed)
Patient aware this was sent to her pharmacy

## 2023-06-18 NOTE — Telephone Encounter (Signed)
Patient aware Rx was sent into pharmacy

## 2023-06-18 NOTE — Telephone Encounter (Signed)
Patient's daughter Rinaldo Cloud called advised the Rx for Baclofen was denied and wanted to know why the Rx was denied and not approved?  The number to contact Rinaldo Cloud is [phone]

## 2023-07-04 DIAGNOSIS — R051 Acute cough: Secondary | ICD-10-CM | POA: Diagnosis not present

## 2023-07-04 DIAGNOSIS — J22 Unspecified acute lower respiratory infection: Secondary | ICD-10-CM | POA: Diagnosis not present

## 2023-07-06 DIAGNOSIS — J22 Unspecified acute lower respiratory infection: Secondary | ICD-10-CM | POA: Diagnosis not present

## 2023-07-10 ENCOUNTER — Other Ambulatory Visit: Payer: Self-pay | Admitting: Orthopaedic Surgery

## 2023-07-19 ENCOUNTER — Other Ambulatory Visit: Payer: Self-pay | Admitting: Orthopaedic Surgery

## 2023-07-25 DIAGNOSIS — Z23 Encounter for immunization: Secondary | ICD-10-CM | POA: Diagnosis not present

## 2023-07-25 DIAGNOSIS — J40 Bronchitis, not specified as acute or chronic: Secondary | ICD-10-CM | POA: Diagnosis not present

## 2023-08-08 ENCOUNTER — Other Ambulatory Visit: Payer: Self-pay | Admitting: Orthopaedic Surgery

## 2023-08-08 NOTE — Telephone Encounter (Signed)
Has not been seen in 6 months , not being actively followed. She can discuss with her PCP. Tried to call daughter times 2, no pick up

## 2023-08-10 ENCOUNTER — Other Ambulatory Visit: Payer: Self-pay | Admitting: Orthopaedic Surgery

## 2023-08-12 ENCOUNTER — Other Ambulatory Visit: Payer: Self-pay | Admitting: Internal Medicine

## 2023-08-12 DIAGNOSIS — Z1231 Encounter for screening mammogram for malignant neoplasm of breast: Secondary | ICD-10-CM

## 2023-08-12 NOTE — Telephone Encounter (Signed)
I called pt and daughter and discussed. Not actively following her. She can discuss with her PCP

## 2023-08-19 DIAGNOSIS — H40023 Open angle with borderline findings, high risk, bilateral: Secondary | ICD-10-CM | POA: Diagnosis not present

## 2023-08-21 ENCOUNTER — Ambulatory Visit: Payer: Medicare Other | Admitting: Physician Assistant

## 2023-09-09 DIAGNOSIS — E7800 Pure hypercholesterolemia, unspecified: Secondary | ICD-10-CM | POA: Diagnosis not present

## 2023-09-09 DIAGNOSIS — E1151 Type 2 diabetes mellitus with diabetic peripheral angiopathy without gangrene: Secondary | ICD-10-CM | POA: Diagnosis not present

## 2023-09-09 DIAGNOSIS — E1169 Type 2 diabetes mellitus with other specified complication: Secondary | ICD-10-CM | POA: Diagnosis not present

## 2023-09-09 DIAGNOSIS — N1831 Chronic kidney disease, stage 3a: Secondary | ICD-10-CM | POA: Diagnosis not present

## 2023-09-09 DIAGNOSIS — G3184 Mild cognitive impairment, so stated: Secondary | ICD-10-CM | POA: Diagnosis not present

## 2023-09-09 DIAGNOSIS — I5032 Chronic diastolic (congestive) heart failure: Secondary | ICD-10-CM | POA: Diagnosis not present

## 2023-09-09 DIAGNOSIS — I480 Paroxysmal atrial fibrillation: Secondary | ICD-10-CM | POA: Diagnosis not present

## 2023-09-09 DIAGNOSIS — I130 Hypertensive heart and chronic kidney disease with heart failure and stage 1 through stage 4 chronic kidney disease, or unspecified chronic kidney disease: Secondary | ICD-10-CM | POA: Diagnosis not present

## 2023-09-09 DIAGNOSIS — M810 Age-related osteoporosis without current pathological fracture: Secondary | ICD-10-CM | POA: Diagnosis not present

## 2023-09-09 DIAGNOSIS — I10 Essential (primary) hypertension: Secondary | ICD-10-CM | POA: Diagnosis not present

## 2023-09-10 ENCOUNTER — Ambulatory Visit
Admission: RE | Admit: 2023-09-10 | Discharge: 2023-09-10 | Disposition: A | Discharge status: Home / Self Care | Payer: Medicare Other | Source: Ambulatory Visit | Attending: Internal Medicine | Admitting: Internal Medicine

## 2023-09-10 DIAGNOSIS — Z1231 Encounter for screening mammogram for malignant neoplasm of breast: Secondary | ICD-10-CM

## 2023-09-13 ENCOUNTER — Ambulatory Visit: Payer: Medicare Other | Admitting: Podiatry

## 2023-09-23 ENCOUNTER — Other Ambulatory Visit: Payer: Self-pay | Admitting: *Deleted

## 2023-09-23 MED ORDER — LINACLOTIDE 145 MCG PO CAPS: 145.0000 ug | ORAL_CAPSULE | Freq: Every day | ORAL | 0 refills | Status: DC

## 2023-09-23 MED ORDER — PANTOPRAZOLE SODIUM 40 MG PO TBEC: 40.0000 mg | DELAYED_RELEASE_TABLET | Freq: Two times a day (BID) | ORAL | 0 refills | Status: DC

## 2023-10-01 ENCOUNTER — Ambulatory Visit: Payer: Medicare Other | Admitting: Physician Assistant

## 2023-10-18 ENCOUNTER — Ambulatory Visit: Payer: Medicare Other | Admitting: Physician Assistant

## 2023-11-05 ENCOUNTER — Telehealth (INDEPENDENT_AMBULATORY_CARE_PROVIDER_SITE_OTHER): Payer: Medicare Other | Admitting: Physician Assistant

## 2023-11-05 DIAGNOSIS — R413 Other amnesia: Secondary | ICD-10-CM

## 2023-11-05 NOTE — Progress Notes (Signed)
Assessment/Plan:   Memory Impairment   Kristin Davidson is a very pleasant 83 y.o. RH female with a history ofhypertension, hyperlipidemia, GERD with dysphagia, A-fib on Eliquis, COPD, Prediabetes, gout, arthritis, depression, anxiety  presenting today in follow-up for evaluation of memory loss. Patient is on ***     Recommendations:   Follow up in   months. Patient is scheduled for neuropsych evaluation in March 2025 for diagnostic clarity. Continue to replenish B12 Recommend good control of cardiovascular risk factors Continue to control mood as per PCP    Subjective:   This patient is accompanied in the office by er daughter  ***  who supplements the history. Previous records as well as any outside records available were reviewed prior to todays visit.   Patient was last seen on 02/18/2023 with [facility] on 12/25/2022 of 20/30.***    Any changes in memory since last visit? ".  Daughter notices that she has some difficulty remembering recent conversations, names or instructions.  Sometimes she confabulates. repeats oneself?  Endorsed, more than before  Disoriented when walking into a room?  Endorsed, especially outside she may wonder why she is there.*** A little bit better than before  Misplacing objects?  Patient denies   Wandering behavior?   Denies.   Any personality changes since last visit?  As before, she may be having moments of irritability Any worsening depression?:  She gets frustrated more often. Hallucinations or paranoia?  denies   Seizures?   denies    Any sleep changes?  Does not sleep very well due to frequent leg cramps at night and recent cold***.   Denies vivid dreams, REM behavior or sleepwalking   Sleep apnea?   denies ***  Any hygiene concerns?   denies   Independent of bathing and dressing?  Endorsed  Does the patient needs help with medications? Daughter is in charge.*** Who is in charge of the finances?  Patient is in charge, "she will not allow anyone to  touch her pocketbook "-daughter says   *** Any changes in appetite?  denies.  She does crave ice cream and other sweets.She admits to not drinking enough water, trying  to do so.***   Patient have trouble swallowing?  She has a history of chronic dysphagia due to GERD-hiatal hernia which required dilatation in the past. Does the patient cook?  Any kitchen accidents such as leaving the stove on?   denies   Any headaches?    denies   Vision changes? denies Chronic pain?  denies   Ambulates with difficulty?  Denies.  She walks outside at least 3 days a week.  She has some gout and arthritis which may limit her mobility.***  Recent falls or head injuries?    denies      Unilateral weakness, numbness or tingling?   denies   Any tremors?  denies   Any anosmia?    denies   Any incontinence of urine?  She has a history of urge incontinence, wears depends. Any bowel dysfunction?  She has a history of chronic constipation followed by GI, on chronic Linzess      Patient lives with her daughter   *** Does the patient drive?  No longer drives***   Initial evaluation 12/25/2022    How long did patient have memory difficulties? For at least 7 .  However, after the patient has moved with her daughter, she noticed that her mother was having difficulty remembering recent conversations, names of people, or instructions.  "  Sometimes she thinks that she told me something but she never did "-daughter says. repeats oneself?  Endorsed by daughter, she may be asking the same question or telling the same story. Disoriented when walking into a room?   "Sometimes she cannot remember what she is doing and walking around in a circle ""we go to the bank she said why are we going here? I never been here before "(is always the same bank that they have been going for a long time).   Leaving objects in unusual places? Endorsed" she puts things that do not belong "  Wandering behavior? denies   Any personality changes ?  Endorsed by her daughter.  She may be lying down, and then she has a spurt of energy and then she goes back to bed   Any history of depression?:  Endorsed.  Gets frustrated more often. Hallucinations or paranoia?  denies   Seizures? denies    Any sleep changes?  Endorsed, she is frequent leg cramps at night.  She has a history of peripheral vascular disease, and this is being followed that by her cardiologist, which may be contributing to some of her symptoms.  In addition, she does not drink enough water.  She does not have iron deficiency anemia.  Denies vivid dreams, REM behavior or sleepwalking.    Sleep apnea? denies  Endorsed, but she refuses to use the CPAP Any hygiene concerns?  denies   Independent of bathing and dressing?  Endorsed  Does the patient need help with medications?  Daughter oversees, but tries to allow her to be in charge of things. Who is in charge of the finances? Patient  is in charge "there is something you cannot do is touch her wallet on her pocketbook ". Any changes in appetite?" She eats and she will say it kept her up all night, some foods don't agree, I do not want to cook for her ".  She does crave ice cream and other sweets Patient have trouble swallowing?  She has a history of chronic dysphagia due to GERD-hiatal hernia requiring dilatation Does the patient cook?  Any kitchen accidents such as leaving the stove on? Patient denies   Any headaches?  denies   Chronic back pain?  denies   Ambulates with difficulty? She walks outside 3 days a week. Arthritis and gout causes some pain Recent falls or head injuries? denies     Vision changes? She has a history of  Glaucoma, L cataract but they do not affect her ADLs. Unilateral weakness, numbness or tingling?  denies   Any tremors?  denies   Any anosmia?  denies   Any incontinence of urine? Endorsed, she has urge incontinence, needs nDepends  Any bowel dysfunction?    She has a history of chronic constipation,  followed by GI, on Linzess Patient lives   daughter History of heavy alcohol intake? denies   History of heavy tobacco use? Denies, quit 5 y ago  Family history of dementia?   no  Does patient drive? no    Z61 is 096, E4V 6.3, TSH nl. Prior MRI of the brain performed at [facility] (disc is not available for review), remarkable for normal for age volume loss, without disproportionate hippocampal volume loss.  A new MRI of the brain has been ordered, but the patient did not perform it.   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   amLODipine (NORVASC) 10 MG tablet Take 1 tablet (10 mg total) by mouth daily.   apixaban (ELIQUIS) 2.5 MG TABS tablet Take 1 tablet (2.5 mg total) by mouth 2 (two) times daily.   baclofen (LIORESAL) 10 MG tablet TAKE 1 TABLET(10 MG) BY MOUTH THREE TIMES DAILY AS NEEDED FOR MUSCLE SPASMS   benazepril (LOTENSIN) 20 MG tablet Take 1.5 tablets (30 mg total) by mouth daily.   DULoxetine (CYMBALTA) 30 MG capsule 1 capsule Orally Once a day for 90 days   furosemide (LASIX) 20 MG tablet Take 1 tablet (20 mg total) by mouth daily. Take one 20mg  tablet daily. May take an  extra tablet daily as needed for swelling or weight gain of 2 pounds overnight or 5 pounds in one week.   gabapentin (NEURONTIN) 100 MG capsule Take 200 mg by mouth daily.   Homeopathic Products (THERAWORX RELIEF) FOAM See admin instructions.   linaclotide (LINZESS) 145 MCG CAPS capsule Take 1 capsule (145 mcg total) by mouth daily.   pantoprazole (PROTONIX) 40 MG tablet Take 1 tablet (40 mg total) by mouth 2 (two) times daily.   terconazole (TERAZOL 7) 0.4 % vaginal cream at bedtime.   No facility-administered encounter medications on file as of 11/05/2023.     Objective:     PHYSICAL EXAMINATION:    VITALS:  There were no vitals filed for this visit.  GEN:  The patient appears stated age and is in NAD. HEENT:  Normocephalic, atraumatic.   Neurological examination:  General: NAD, well-groomed, appears stated age. Orientation: The patient is alert. Oriented to person, place and date Cranial nerves: There is good facial symmetry.The speech is fluent and clear. No aphasia or dysarthria. Fund of knowledge is appropriate. Recent memory impaired and remote memory is normal.  Attention and concentration are normal.  Able to name objects and repeat phrases.  Hearing is intact to conversational tone ***.   Delayed recall *** Sensation: Sensation is intact to light touch throughout Motor: Strength is at least antigravity x4. DTR's 2/4 in UE/LE      12/25/2022   12:00 PM  Montreal Cognitive Assessment   Visuospatial/ Executive (0/5) 1  Naming (0/3) 3  Attention: Read list of digits (0/2) 1  Attention: Read list of letters (0/1) 1  Attention: Serial 7 subtraction starting at 100 (0/3) 2  Language: Repeat phrase (0/2) 1  Language : Fluency (0/1) 0  Abstraction (0/2) 1  Delayed Recall (0/5) 4  Orientation (0/6) 5  Total 19  Adjusted Score (based on education) 20        No data to display  Movement examination: Tone: There is normal tone in the UE/LE Abnormal  movements:  no tremor.  No myoclonus.  No asterixis.   Coordination:  There is no decremation with RAM's. Normal finger to nose  Gait and Station: The patient has no difficulty arising out of a deep-seated chair without the use of the hands. The patient's stride length is good.  Gait is cautious and narrow.   Thank you for allowing [place] the opportunity to participate in the care of this nice patient. Please do not hesitate to contact us for any questions or concerns.   Total time spent on today's visit was *** minutes dedicated to this patient today, preparing to see patient, examining the patient, ordering tests and/or medications and counseling the patient, documenting clinical information in the EHR or other health record, independently interpreting results and communicating results to the patient/family, discussing treatment and goals, answering patient's questions and coordinating care.  Cc:  Renford Dills, MD  Marlowe Kays 11/05/2023 7:12 AM

## 2023-11-06 NOTE — Patient Instructions (Signed)
It was a pleasure to see you today at our office.   Recommendations:  Follow up in 6 months Continue B12 supplements  Consider psychotherapy, walking program  Neurocognitive testing Increase socialization   Whom to call:  Memory  decline, memory medications: Call our office [phone]   For psychiatric meds, mood meds: Please have your primary care physician manage these medications.       For assessment of decision of mental capacity and competency:  Call Dr. Erick Blinks, geriatric psychiatrist at [phone]  For guidance in geriatric dementia issues please call [facility] [phone]    If you have any severe symptoms of a stroke, or other severe issues such as confusion,severe chills or fever, etc call 911 or go to the ER as you may need to be evaluated further   Feel free to visit Facebook page " [email or web address]" for tips of how to care for people with memory problems.       RECOMMENDATIONS FOR ALL PATIENTS WITH MEMORY PROBLEMS: 1. Continue to exercise (Recommend 30 minutes of walking everyday, or 3 hours every week) 2. Increase social interactions - continue going to [place] and enjoy social gatherings with friends and family 3. Eat healthy, avoid fried foods and eat more fruits and vegetables 4. Maintain adequate blood pressure, blood sugar, and blood cholesterol level. Reducing the risk of stroke and cardiovascular disease also helps promoting better memory. 5. Avoid stressful situations. Live a simple life and avoid aggravations. Organize your time and prepare for the next day in anticipation. 6. Sleep well, avoid any interruptions of sleep and avoid any distractions in the bedroom that may interfere with adequate sleep quality 7. Avoid sugar, avoid sweets as there is a strong link between excessive sugar intake, diabetes, and cognitive impairment We discussed the Mediterranean diet, which has been shown to help patients reduce the risk of progressive memory  disorders and reduces cardiovascular risk. This includes eating fish, eat fruits and green leafy vegetables, nuts like almonds and hazelnuts, walnuts, and also use olive oil. Avoid fast foods and fried foods as much as possible. Avoid sweets and sugar as sugar use has been linked to worsening of memory function.  There is always a concern of gradual progression of memory problems. If this is the case, then we may need to adjust level of care according to patient needs. Support, both to the patient and caregiver, should then be put into place.         FALL PRECAUTIONS: Be cautious when walking. Scan the area for obstacles that may increase the risk of trips and falls. When getting up in the mornings, sit up at the edge of the bed for a few minutes before getting out of bed. Consider elevating the bed at the head end to avoid drop of blood pressure when getting up. Walk always in a well-lit room (use night lights in the walls). Avoid area rugs or power cords from appliances in the middle of the walkways. Use a walker or a cane if necessary and consider physical therapy for balance exercise. Get your eyesight checked regularly.  FINANCIAL OVERSIGHT: Supervision, especially oversight when making financial decisions or transactions is also recommended.  HOME SAFETY: Consider the safety of the kitchen when operating appliances like stoves, microwave oven, and blender. Consider having supervision and share cooking responsibilities until no longer able to participate in those. Accidents with firearms and other hazards in the house should be identified and addressed as well.   ABILITY TO  BE LEFT ALONE: If patient is unable to contact 911 operator, consider using LifeLine, or when the need is there, arrange for someone to stay with patients. Smoking is a fire hazard, consider supervision or cessation. Risk of wandering should be assessed by caregiver and if detected at any point, supervision and safe proof  recommendations should be instituted.  MEDICATION SUPERVISION: Inability to self-administer medication needs to be constantly addressed. Implement a mechanism to ensure safe administration of the medications.   DRIVING: Regarding driving, in patients with progressive memory problems, driving will be impaired. We advise to have someone else do the driving if trouble finding directions or if minor accidents are reported. Independent driving assessment is available to determine safety of driving.   If you are interested in the driving assessment, you can contact the following:  [facility] [employer] [facility] in [place] [phone]  [facility] [phone]  [facility] [phone] or [phone]    Mediterranean Diet A Mediterranean diet refers to food and lifestyle choices that are based on the traditions of countries located on the [employer]. This way of eating has been shown to help prevent certain conditions and improve outcomes for people who have chronic diseases, like kidney disease and heart disease. What are tips for following this plan? Lifestyle  Cook and eat meals together with your family, when possible. Drink enough fluid to keep your urine clear or pale yellow. Be physically active every day. This includes: Aerobic exercise like running or swimming. Leisure activities like gardening, walking, or housework. Get 7-8 hours of sleep each night. If recommended by your health care provider, drink red wine in moderation. This means 1 glass a day for nonpregnant women and 2 glasses a day for men. A glass of wine equals 5 oz (150 mL). Reading food labels  Check the serving size of packaged foods. For foods such as rice and pasta, the serving size refers to the amount of cooked product, not dry. Check the total fat in packaged foods. Avoid foods that have saturated fat or trans fats. Check the ingredients list for  added sugars, such as corn syrup. Shopping  At the grocery store, buy most of your food from the areas near the walls of the store. This includes: Fresh fruits and vegetables (produce). Grains, beans, nuts, and seeds. Some of these may be available in unpackaged forms or large amounts (in bulk). Fresh seafood. Poultry and eggs. Low-fat dairy products. Buy whole ingredients instead of prepackaged foods. Buy fresh fruits and vegetables in-season from local farmers markets. Buy frozen fruits and vegetables in resealable bags. If you do not have access to quality fresh seafood, buy precooked frozen shrimp or canned fish, such as tuna, salmon, or sardines. Buy small amounts of raw or cooked vegetables, salads, or olives from the deli or salad bar at your store. Stock your pantry so you always have certain foods on hand, such as olive oil, canned tuna, canned tomatoes, rice, pasta, and beans. Cooking  Cook foods with extra-virgin olive oil instead of using butter or other vegetable oils. Have meat as a side dish, and have vegetables or grains as your main dish. This means having meat in small portions or adding small amounts of meat to foods like pasta or stew. Use beans or vegetables instead of meat in common dishes like chili or lasagna. Experiment with different cooking methods. Try roasting or broiling vegetables instead of steaming or sauteing them. Add frozen vegetables to soups,  stews, pasta, or rice. Add nuts or seeds for added healthy fat at each meal. You can add these to yogurt, salads, or vegetable dishes. Marinate fish or vegetables using olive oil, lemon juice, garlic, and fresh herbs. Meal planning  Plan to eat 1 vegetarian meal one day each week. Try to work up to 2 vegetarian meals, if possible. Eat seafood 2 or more times a week. Have healthy snacks readily available, such as: Vegetable sticks with hummus. Greek yogurt. Fruit and nut trail mix. Eat balanced meals throughout  the week. This includes: Fruit: 2-3 servings a day Vegetables: 4-5 servings a day Low-fat dairy: 2 servings a day Fish, poultry, or lean meat: 1 serving a day Beans and legumes: 2 or more servings a week Nuts and seeds: 1-2 servings a day Whole grains: 6-8 servings a day Extra-virgin olive oil: 3-4 servings a day Limit red meat and sweets to only a few servings a month What are my food choices? Mediterranean diet Recommended Grains: Whole-grain pasta. Brown rice. Bulgar wheat. Polenta. Couscous. Whole-wheat bread. Orpah Cobb. Vegetables: Artichokes. Beets. Broccoli. Cabbage. Carrots. Eggplant. Green beans. Chard. Kale. Spinach. Onions. Leeks. Peas. Squash. Tomatoes. Peppers. Radishes. Fruits: Apples. Apricots. Avocado. Berries. Bananas. Cherries. Dates. Figs. Grapes. Lemons. Melon. Oranges. Peaches. Plums. Pomegranate. Meats and other protein foods: Beans. Almonds. Sunflower seeds. Pine nuts. Peanuts. Cod. Salmon. Scallops. Shrimp. Tuna. Tilapia. Clams. Oysters. Eggs. Dairy: Low-fat milk. Cheese. Greek yogurt. Beverages: Water. Red wine. Herbal tea. Fats and oils: Extra virgin olive oil. Avocado oil. Grape seed oil. Sweets and desserts: [place] yogurt with honey. Baked apples. Poached pears. Trail mix. Seasoning and other foods: Basil. Cilantro. Coriander. Cumin. Mint. Parsley. Sage. Rosemary. Tarragon. Garlic. Oregano. Thyme. Pepper. Balsalmic vinegar. Tahini. Hummus. Tomato sauce. Olives. Mushrooms. Limit these Grains: Prepackaged pasta or rice dishes. Prepackaged cereal with added sugar. Vegetables: Deep fried potatoes (french fries). Fruits: Fruit canned in syrup. Meats and other protein foods: Beef. Pork. Lamb. Poultry with skin. Hot dogs. Tomasa Blase. Dairy: Ice cream. Sour cream. Whole milk. Beverages: Juice. Sugar-sweetened soft drinks. Beer. Liquor and spirits. Fats and oils: Butter. Canola oil. Vegetable oil. Beef fat (tallow). Lard. Sweets and desserts: Cookies. Cakes. Pies.  Candy. Seasoning and other foods: Mayonnaise. Premade sauces and marinades. The items listed may not be a complete list. Talk with your dietitian about what dietary choices are right for you. Summary The Mediterranean diet includes both food and lifestyle choices. Eat a variety of fresh fruits and vegetables, beans, nuts, seeds, and whole grains. Limit the amount of red meat and sweets that you eat. Talk with your health care provider about whether it is safe for you to drink red wine in moderation. This means 1 glass a day for nonpregnant women and 2 glasses a day for men. A glass of wine equals 5 oz (150 mL). This information is not intended to replace advice given to you by your health care provider. Make sure you discuss any questions you have with your health care provider. Document Released: 05/17/2016 Document Revised: 06/19/2016 Document Reviewed: 05/17/2016 Elsevier Interactive Patient Education  2017 [employer].

## 2023-12-11 ENCOUNTER — Encounter: Payer: Self-pay | Admitting: Psychology

## 2023-12-11 DIAGNOSIS — G4733 Obstructive sleep apnea (adult) (pediatric): Secondary | ICD-10-CM | POA: Insufficient documentation

## 2023-12-11 DIAGNOSIS — E119 Type 2 diabetes mellitus without complications: Secondary | ICD-10-CM | POA: Insufficient documentation

## 2023-12-12 ENCOUNTER — Encounter: Payer: Self-pay | Admitting: Psychology

## 2023-12-12 ENCOUNTER — Ambulatory Visit: Payer: Self-pay | Admitting: Psychology

## 2023-12-12 ENCOUNTER — Ambulatory Visit: Payer: Medicare Other | Admitting: Psychology

## 2023-12-12 DIAGNOSIS — R4189 Other symptoms and signs involving cognitive functions and awareness: Secondary | ICD-10-CM

## 2023-12-12 DIAGNOSIS — F03A3 Unspecified dementia, mild, with mood disturbance: Secondary | ICD-10-CM | POA: Diagnosis not present

## 2023-12-12 DIAGNOSIS — F03A0 Unspecified dementia, mild, without behavioral disturbance, psychotic disturbance, mood disturbance, and anxiety: Secondary | ICD-10-CM | POA: Insufficient documentation

## 2023-12-12 HISTORY — DX: Unspecified dementia, mild, without behavioral disturbance, psychotic disturbance, mood disturbance, and anxiety: F03.A0

## 2023-12-12 NOTE — Progress Notes (Signed)
   Psychometrician Note   Cognitive testing was administered to Kristin Davidson by Wallace Keller, B.S. (psychometrist) under the supervision of Dr. Newman Nickels, Ph.D., ABPP, licensed psychologist on 12/12/2023. [name] did not appear overtly distressed by the testing session per behavioral observation or responses across self-report questionnaires. Rest breaks were offered.    The battery of tests administered was selected by Dr. Newman Nickels, Ph.D., ABPP with consideration to [name]'s current level of functioning, the nature of her symptoms, emotional and behavioral responses during interview, level of literacy, observed level of motivation/effort, and the nature of the referral question. This battery was communicated to the psychometrist. Communication between Dr. Newman Nickels, Ph.D., ABPP and the psychometrist was ongoing throughout the evaluation and Dr. Newman Nickels, Ph.D., ABPP was immediately accessible at all times. Dr. Newman Nickels, Ph.D., ABPP provided supervision to the psychometrist on the date of this service to the extent necessary to assure the quality of all services provided.    Kristin Davidson will return within approximately 1-2 weeks for an interactive feedback session with Dr. Milbert Coulter at which time her test performances, clinical impressions, and treatment recommendations will be reviewed in detail. [name] understands she can contact our office should she require our assistance before this time.  A total of 125 minutes of billable time were spent face-to-face with [name] by the psychometrist. This includes both test administration and scoring time. Billing for these services is reflected in the clinical report generated by Dr. Newman Nickels, Ph.D., ABPP  This note reflects time spent with the psychometrician and does not include test scores or any clinical interpretations made by Dr. Milbert Coulter. The full report will follow in a separate note.

## 2023-12-12 NOTE — Progress Notes (Signed)
 NEUROPSYCHOLOGICAL EVALUATION [place]. [facility] Department of Neurology  Date of Evaluation: December 12, 2023  Reason for Referral:   Kristin Davidson is a 83 y.o. right-handed African-American female referred by Kristin Kays, PA-C, to characterize her current cognitive functioning and assist with diagnostic clarity and treatment planning in the context of subjective cognitive decline.   Assessment and Plan:   Clinical Impression(s): Kristin Davidson pattern of performance is suggestive of primary impairments surrounding cognitive flexibility, confrontation naming, and non-clock drawing visuospatial abilities. Further performance variability was exhibited across processing speed and all aspects of learning and memory. Performances were appropriate relative to age-matched peers across attention/concentration, receptive language, verbal fluency (semantic worse than phonemic), and clock drawing. Functionally, Kristin Davidson no longer drives and benefits from residing with her family and having support/supervision surrounding medication management. She is likely on the border between mild and major neurocognitive disorder designations. 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  The cause for her mild dementia presentation is unclear as Kristin Davidson exhibits a fairly nonspecific testing pattern. Per population base rates, Alzheimer's disease represents the most common neurodegenerative illness which leads to a dementia diagnosis by a wide margin. Kristin Davidson daughter's description of day-to-day functioning where Kristin Davidson experiences rapid forgetting and prominent repetition certainly aligns with how this illness can present. However, memory testing remained variable and  without any consistent pattern of impairment. While her retention rate was 0% across a list learning task after a brief delay, rates ranged from 60% to 62% across other memory tasks. Likewise, while a recognition performance across a story learning task was quite poor, recognition performances across list and figure memory tasks were adequate. Testing wise, Kristin Davidson's memory tests do not exhibit a classic Alzheimer's pattern at the present time. With that being said, deficits in executive functioning, confrontation naming, and visuospatial abilities very commonly occur in Alzheimer's disease and there remains the potential for this illness to be present and exhibiting a somewhat atypical pattern of progression. However, the presence of this illness cannot be stated with confidence at the present time based upon testing alone. Continued medical monitoring will be very important moving forward.  Prominent deficits in executive functioning and visuospatial abilities can raise concern for Lewy body disease or another more rare parkinsonian presentation. However, Kristin Davidson exhibited no parkinsonian signs and there are no report of any visual hallucinations, REM sleep behaviors, frequent falling, myoclonic jerking, or alien limb experiences, making the presence of one of these illnesses unlikely. Prominent deficits in executive functioning and expressive language can raise concern for frontotemporal dementia. However, Kristin Davidson and her daughter denied any prominent personality changes and her conversational language was appropriate, making this diagnosis unlikely.   Kristin Davidson did report acute symptoms of mild depression and moderate anxiety across mood-related questionnaires. She also reported mild sleep dysfunction and her most recent brain MRI suggested mild microvascular ischemic disease. These factors could play a contributory role to ongoing dysfunction. However, I question that they are the primary  driving forces of current cognitive decline.   Recommendations: A repeat neuropsychological evaluation in 12-18 months is recommended to assess the trajectory of future cognitive decline should it occur. This would likely aid in future efforts towards improved diagnostic clarity.  A lumbar puncture could be considered if she or her  family wish for further diagnostic testing regarding concerns for an atypically presenting Alzheimer's disease presentation. They should discuss this with Kristin Davidson.   Kristin Davidson could discuss medications aimed to address memory loss and concerns for a neurodegenerative illness with Kristin Davidson. It is important to highlight that these medications have been shown to slow functional decline in some individuals. There is no current treatment which can stop or reverse cognitive decline when caused by a neurodegenerative illness.   Kristin Davidson is encouraged to speak with her prescribing physician regarding medication adjustments to optimally manage symptoms of anxiety and depression. She could also speak with this individual surrounding ongoing sleep disturbances.  Performance across neurocognitive testing is not a strong predictor of an individual's safety operating a motor vehicle. With that being said, I do agree with Kristin Davidson abstaining from driving behaviors due to cognitive impairments. Should she or her family wish to pursue a formalized driving evaluation, they could reach out to the following agencies: [employer] in [place]: [phone] [facility]: [phone] [facility]: [phone] [facility]: [phone] or [phone]  Should there be progression of current deficits over time, Kristin Davidson is unlikely to regain any independent living skills lost. Therefore, it is recommended that she remain as involved as possible in all aspects of household chores, finances, and medication management, with  supervision to ensure adequate performance. She will likely benefit from the establishment and maintenance of a routine in order to maximize her functional abilities over time.  It will be important for Ms. Tiegs to have another person with her when in situations where she may need to process information, weigh the pros and cons of different options, and make decisions, in order to ensure that she fully understands and recalls all information to be considered.  If not already done, Ms. Llorente and her family may want to discuss her wishes regarding durable power of attorney and medical decision making, so that she can have input into these choices. If they require legal assistance with this, long-term care resource access, or other aspects of estate planning, they could reach out to [employer] at [phone] for a free consultation. Additionally, they may wish to discuss future plans for caretaking and seek out community options for in home/residential care should they become necessary.  Ms. Dymond is encouraged to attend to lifestyle factors for brain health (e.g., regular physical exercise, good nutrition habits and consideration of the MIND-DASH diet, regular participation in cognitively-stimulating activities, and general stress management techniques), which are likely to have benefits for both emotional adjustment and cognition. In fact, in addition to promoting good general health, regular exercise incorporating aerobic activities (e.g., brisk walking, jogging, cycling, etc.) has been demonstrated to be a very effective treatment for depression and stress, with similar efficacy rates to both antidepressant medication and psychotherapy. Optimal control of vascular risk factors (including safe cardiovascular exercise and adherence to dietary recommendations) is encouraged. Continued participation in activities which provide mental stimulation and social interaction is also recommended.    Important information should be provided to Ms. Swarthout in written format in all instances. This information should be placed in a highly frequented and easily visible location within her home to promote recall. External strategies such as written notes in a consistently used memory journal, visual and nonverbal auditory cues such as a calendar on the refrigerator or appointments with alarm, such as on a cell phone, can also help maximize recall.  When learning new information, she  would benefit from information being broken up into small, manageable pieces. She may find it helpful to articulate the material in her own words and in a context to promote encoding at the onset of a new task. This material may need to be repeated multiple times to promote encoding. She may also understand and retain new information better if it is presented to her in a meaningful or well-organized manner at the outset, such as grouping items into meaningful categories or presenting information in an outlined, bulleted, or story format.  To address problems with processing speed, she may wish to consider:   -Ensuring that she is alerted when essential material or instructions are being presented   -Adjusting the speed at which new information is presented   -Allowing for more time in comprehending, processing, and responding in conversation   -Repeating and paraphrasing instructions or conversations aloud  To address problems with fluctuating attention and/or executive dysfunction, she may wish to consider:   -Avoiding external distractions when needing to concentrate   -Limiting exposure to fast paced environments with multiple sensory demands   -Writing down complicated information and using checklists   -Attempting and completing one task at a time (i.e., no multi-tasking)   -Verbalizing aloud each step of a task to maintain focus   -Taking frequent breaks during the completion of steps/tasks to avoid fatigue    -Reducing the amount of information considered at one time   -Scheduling more difficult activities for a time of day where she is usually most alert  Review of Records:   Ms. Puzzo was seen by [facility] Kristin Kays, PA-C) on 12/25/2022 for an evaluation of memory loss. At that time, Ms. Maack's daughter noted difficulties surrounding rapid forgetting, trouble recalling details of recent conversations, trouble recalling instructions, and trouble recalling names. She also noted Ms. Morikawa being more repetitive in day-to-day conversation. Functionally, Ms. Devora moved in with her daughter a couple years prior. Her daughter closely supervises medication management and Ms. Haseley no longer drives. Performance on a brief cognitive screening instrument (MOCA) was 20/30.   She was most recently seen by Kristin Davidson on 11/05/2023 for follow-up. Memory was described as stable. Ultimately, Ms. Auker was referred for a comprehensive neuropsychological evaluation to characterize her cognitive abilities and to assist with diagnostic clarity and treatment planning.   Neuroimaging: Head CT on 06/08/2005 was negative. Brain MRI on 01/01/2023 suggested mild cerebral volume loss and mild leukoaraiosis.   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/08/2021   Cataract 05/02/2021   only R eye at this time   Chronic kidney disease, stage 3a 11/09/2020   Chronic obstructive pulmonary disease, unspecified 11/09/2020   Colitis    Diastolic dysfunction 11/09/2020   Essential hypertension 08/06/2007   Excessive thirst 11/09/2020   Generalized anxiety disorder    GERD 08/06/2007   Gout    Lower back pain 05/02/2021   Obstructive sleep apnea    suspected but never formally diagnosed per daughter   Paroxysmal atrial fibrillation 05/19/2020   Peripheral arterial  disease 07/19/2021   left worse than right   Seborrheic keratosis 08/06/2007   Secondary hypercoagulable state 05/19/2020   Solitary cyst of breast 08/06/2007   Spinal stenosis of lumbar region 11/07/2021   Type II diabetes mellitus     Past Surgical History:  Procedure Laterality Date  ABDOMINAL HYSTERECTOMY     ABDOMINAL SURGERY     BREAST EXCISIONAL BIOPSY Right >10+ yrs ago   benign   COLONOSCOPY     KNEE SURGERY      Current Outpatient Medications:    allopurinol (ZYLOPRIM) 100 MG tablet, Take 200 mg by mouth daily., Disp: , Rfl:    amLODipine (NORVASC) 10 MG tablet, Take 1 tablet (10 mg total) by mouth daily., Disp: 90 tablet, Rfl: 3   apixaban (ELIQUIS) 2.5 MG TABS tablet, Take 1 tablet (2.5 mg total) by mouth 2 (two) times daily., Disp: 60 tablet, Rfl: 11   baclofen (LIORESAL) 10 MG tablet, TAKE 1 TABLET(10 MG) BY MOUTH THREE TIMES DAILY AS NEEDED FOR MUSCLE SPASMS, Disp: 30 tablet, Rfl: 0   benazepril (LOTENSIN) 20 MG tablet, Take 1.5 tablets (30 mg total) by mouth daily., Disp: 135 tablet, Rfl: 3   DULoxetine (CYMBALTA) 30 MG capsule, 1 capsule Orally Once a day for 90 days, Disp: , Rfl:    furosemide (LASIX) 20 MG tablet, Take 1 tablet (20 mg total) by mouth daily. Take one 20mg  tablet daily. May take an extra tablet daily as needed for swelling or weight gain of 2 pounds overnight or 5 pounds in one week., Disp: 120 tablet, Rfl: 3   gabapentin (NEURONTIN) 100 MG capsule, Take 200 mg by mouth daily., Disp: , Rfl:    Homeopathic Products (THERAWORX RELIEF) FOAM, See admin instructions., Disp: , Rfl:    linaclotide (LINZESS) 145 MCG CAPS capsule, Take 1 capsule (145 mcg total) by mouth daily., Disp: 90 capsule, Rfl: 0   pantoprazole (PROTONIX) 40 MG tablet, Take 1 tablet (40 mg total) by mouth 2 (two) times daily., Disp: 60 tablet, Rfl: 0   terconazole (TERAZOL 7) 0.4 % vaginal cream, at bedtime., Disp: , Rfl:   Clinical Interview:   The following information was obtained  during a clinical interview with Ms. Grieser and her daughter prior to cognitive testing.  Cognitive Symptoms: Decreased short-term memory: Endorsed. Per Ms. Ave Filter, she described having good and bad days surrounding memory abilities, noting a more consistent difficulty recalling names. Per her, difficulties have been present and stable for the prior 8 months. Her daughter described more prominent memory impairment, noting concerns for rapid forgetting and repetition in day-to-day conversation. Per her daughter, difficulties have been progressively worsening since 2021 if not earlier.  Decreased long-term memory: Denied. Decreased attention/concentration: Denied. Reduced processing speed: Denied. Difficulties with executive functions: Denied. Difficulties with emotion regulation: Denied. No prominent personality changes were reported.  Difficulties with receptive language: Denied. Difficulties with word finding: Denied. Decreased visuoperceptual ability: Denied.  Difficulties completing ADLs: Somewhat. Her daughter is involved with medication management and supervises to ensure proper adherence. Per medical records, Ms. Yeo no longer drives. Medical records are consistent in suggesting that Ms. Haggard is adamant in not allowing others to manage finances for her.   Additional Medical History: History of traumatic brain injury/concussion: Denied. History of stroke: Denied. History of seizure activity: Denied. History of known exposure to toxins: Denied. Symptoms of chronic pain: Endorsed. She described ongoing sciatic nerve pain (her daughter described this as stenosis) which can be debilitating at times. She also reported subsequent leg swelling.  Experience of frequent headaches/migraines: Endorsed. Lately, she described experiencing mild headache symptoms every other day. She wondered if this could be related to medication side effects.  Frequent instances of dizziness/vertigo:  Denied.  Sensory changes: She wears glasses with benefit. She reported occasional instances where she feels  a sharp pain like a pin in her eye. She noted that a previous physician had expressed glaucoma concerns. She also reported mild hearing loss where others in her environment sound like they are mumbling. Other sensory changes/difficulties were not reported.  Balance/coordination difficulties: Denied. Other motor difficulties: Denied.  Sleep History: Estimated hours obtained each night: 6-7 hours.  Difficulties falling asleep: Denied. Difficulties staying asleep: Denied. Feels rested and refreshed upon awakening: Denied. She reported commonly waking feeling fatigued but will generally make herself get out of bed and start her day.   History of snoring: Endorsed. History of waking up gasping for air: Unclear. Witnessed breath cessation while asleep: Unclear. Medical records suggest a prior obstructive sleep apnea diagnosis with no CPAP adherence. Per her daughter, this condition was suspected to be present due to significant snoring behaviors. However, her daughter stated that Ms. Tarlton was never willing to complete a sleep study.   History of vivid dreaming: Denied. Excessive movement while asleep: Denied. Instances of acting out her dreams: Denied.  Psychiatric/Behavioral Health History: Depression: She described her current mood as "good" and denied to her knowledge any prior mental health concerns or formal diagnoses. She did describe ongoing frustration stemming from sciatic nerve pain. Current or remote suicidal ideation, intent, or plan was denied.  Anxiety: Denied. Mania: Denied. Trauma History: Denied. Visual/auditory hallucinations: Denied. Delusional thoughts: Denied.  Tobacco: Denied. Alcohol: She denied current alcohol consumption as well as a history of problematic alcohol abuse or dependence.  Recreational drugs: Denied.  Family History: Problem Relation Age of  Onset   Bone cancer Mother    Heart attack Father 70   Colon cancer Neg Hx    Esophageal cancer Neg Hx    Rectal cancer Neg Hx    Stomach cancer Neg Hx    Colon polyps Neg Hx    This information was confirmed by Ms. Lyon.  Academic/Vocational History: Highest level of educational attainment: 12 years. She graduated from high school and described herself as a good (A/B) student in academic settings. Math was noted as a likely relative weakness.  History of developmental delay: Denied. History of grade repetition: Denied. Enrollment in special education courses: Denied. History of LD/ADHD: Denied.  Employment: Retired. She previously worked as a Engineer, structural.   Evaluation Results:   Behavioral Observations: Ms. Sedor was accompanied by her daughter, arrived to her appointment on time, and was appropriately dressed and groomed. She appeared alert. Observed gait and station were within normal limits. Gross motor functioning appeared intact upon informal observation and no abnormal movements (e.g., tremors) were noted. Her affect was generally relaxed and positive. Spontaneous speech was fluent and word finding difficulties were not observed during the clinical interview. Thought processes were coherent, organized, and normal in content. Insight into her cognitive difficulties appeared limited and I do have concern that she does not appreciate the full extent of ongoing impairment.   During testing, sustained attention was appropriate. Task engagement was adequate and she persisted when challenged. She fatigued as the evaluation progressed. She also made comments surrounding growing hunger. The evaluation was somewhat abbreviated in response. Overall, Ms. Galeno was cooperative with the clinical interview and subsequent testing procedures.   Adequacy of Effort: The validity of neuropsychological testing is limited by the extent to which the individual being tested may be assumed to have  exerted adequate effort during testing. Ms. Muffley expressed her intention to perform to the best of her abilities and exhibited adequate task engagement and persistence. Scores across  stand-alone and embedded performance validity measures were within expectation. As such, the results of the current evaluation are believed to be a valid representation of Ms. Kliethermes's current cognitive functioning.  Test Results: Ms. Mcgirr was mildly disoriented at the time of the current evaluation. She incorrectly stated her phone number. She was also unable to state the current date or name of the current clinic.   Intellectual abilities based upon educational and vocational attainment were estimated to be in the average range. Premorbid abilities were estimated to be within the below average range based upon a single-word reading test.   Processing speed was variable, ranging from the exceptionally low to average normative ranges. Basic attention was average. More complex attention (e.g., working memory) was also average. Cognitive flexibility was exceptionally low. Other aspects of executive functioning were unable to be assessed.  While not directly assessed, receptive language abilities were believed to be adequate. Ms. Sproule did not exhibit any difficulties comprehending task instructions and answered all questions asked of her appropriately. Assessed expressive language was variable. Phonemic fluency was average, semantic fluency was below average, and confrontation naming was well below average.    Assessed visuospatial/visuoconstructional abilities were exceptionally low to well below average outside her drawing of a clock (which was largely adequate). Points were lost on her drawing of a clock due to mild numerical spacing irregularities, as well as no size differentiation between clock hands. Across her copy of a complex figure, points were lost due to several generally mild visual distortions and at  least one internal aspect being fully omitted.    Learning (i.e., encoding) of novel verbal information was exceptionally low to below average. Spontaneous delayed recall (i.e., retrieval) of previously learned information was also exceptionally low to below average. Retention rates were 0% across a list learning task, 60% (raw score of 3) across a story learning task, and 62% across a figure drawing task. Performance across recognition tasks was variable, ranging from the well below average to average normative ranges, suggesting some evidence for information consolidation.   Results of emotional screening instruments suggested that recent symptoms of generalized anxiety were in the moderate range, while symptoms of depression were within the mild range. A screening instrument assessing recent sleep quality suggested the presence of mild sleep dysfunction.  Tables of Scores:   Note: This summary of test scores accompanies the interpretive report and should not be considered in isolation without reference to the appropriate sections in the text. Descriptors are based on appropriate normative data and may be adjusted based on clinical judgment. Terms such as "Within Normal Limits" and "Outside Normal Limits" are used when a more specific description of the test score cannot be determined.       Percentile - Normative Descriptor > 98 - Exceptionally High 91-97 - Well Above Average 75-90 - Above Average 25-74 - Average 9-24 - Below Average 2-8 - Well Below Average < 2 - Exceptionally Low       Validity:   DESCRIPTOR       DCT: --- --- Outside Normal Limits  RBANS EI: --- --- Within Normal Limits       Orientation:      Raw Score Percentile   NAB Orientation, Form 1 24/29 --- ---       Cognitive Screening:      Raw Score Percentile   SLUMS: 13/30 --- ---       RBANS, Form A: Standard Score/ Scaled Score Percentile   Total Score 64 1  Exceptionally Low  Immediate Memory 69 2 Well Below  Average    List Learning 6 9 Below Average    Story Memory 3 1 Exceptionally Low  Visuospatial/Constructional 62 1 Exceptionally Low    Figure Copy 4 2 Well Below Average    Line Orientation 7/20 <2 Exceptionally Low  Language 75 5 Well Below Average    Picture Naming 8/10 3-9 Well Below Average    Semantic Fluency 6 9 Below Average  Attention 75 5 Well Below Average    Digit Span 9 37 Average    Coding 3 1 Exceptionally Low  Delayed Memory 78 7 Well Below Average    List Recall 0/10 <2 Exceptionally Low    List Recognition 18/20 17-25 Below Average  to Average    Story Recall 5 5 Well Below Average    Story Recognition 6/12 5-6 Well Below Average    Figure Recall 7 16 Below Average    Figure Recognition 6/8 58-68 Average        Intellectual Functioning:      Standard Score Percentile   Test of Premorbid Functioning: 86 18 Below Average       Attention/Executive Function:     Trail Making Test (TMT): Raw Score (T Score) Percentile     Part A 95 secs.,  1 error (30) 2 Well Below Average    Part B Discontinued --- Impaired         Scaled Score Percentile   WAIS-5 Coding: 4 2 Well Below Average  WAIS-5 Naming Speed Quantity: 10 50 Average        Scaled Score Percentile   WAIS-5 Digits Forwards: 8 25 Average  WAIS-5 Digit Sequencing: 8 25 Average       Language:     Verbal Fluency Test: Raw Score (Scaled Score) Percentile     Phonemic Fluency (CFL) 25 (8) 25 Average    Category Fluency 29 (7) 16 Below Average  *Based on [employer] Older Normative Studies (MOANS)          NAB Language Module, Form 1: T Score Percentile     Naming 22/31 (32) 4 Well Below Average       Visuospatial/Visuoconstruction:      Raw Score Percentile   Clock Drawing: 8/10 --- Within Normal Limits       Mood and Personality:      Raw Score Percentile   Geriatric Depression Scale: 15 --- Mild  Geriatric Anxiety Scale: 26 --- Moderate    Somatic 13 --- Severe    Cognitive 6 --- Mild    Affective  7 --- Moderate       Additional Questionnaires:      Raw Score Percentile   PROMIS Sleep Disturbance Questionnaire: 25 --- Mild   Informed Consent and Coding/Compliance:   The current evaluation represents a clinical evaluation for the purposes previously outlined by the referral source and is in no way reflective of a forensic evaluation.   Ms. Dotts was provided with a verbal description of the nature and purpose of the present neuropsychological evaluation. Also reviewed were the foreseeable risks and/or discomforts and benefits of the procedure, limits of confidentiality, and mandatory reporting requirements of this provider. The patient was given the opportunity to ask questions and receive answers about the evaluation. Oral consent to participate was provided by the patient.   This evaluation was conducted by Newman Nickels, Ph.D., ABPP-CN, board certified clinical neuropsychologist. Ms. Ave Filter completed a clinical interview with Dr. Milbert Coulter, billed  as one unit [id number], and 125 minutes of cognitive testing and scoring, billed as one unit [phone] and three additional units [id number]. Psychometrist Wallace Keller, B.S. assisted Dr. Milbert Coulter with test administration and scoring procedures. As a separate and discrete service, one unit [id number] and two units [phone] were billed for Dr. Tammy Sours time spent in interpretation and report writing.

## 2023-12-13 ENCOUNTER — Encounter: Payer: Self-pay | Admitting: Psychology

## 2023-12-19 ENCOUNTER — Ambulatory Visit: Payer: Medicare Other | Admitting: Psychology

## 2023-12-19 DIAGNOSIS — F03A3 Unspecified dementia, mild, with mood disturbance: Secondary | ICD-10-CM | POA: Diagnosis not present

## 2023-12-19 NOTE — Progress Notes (Signed)
   Neuropsychology Feedback Session Kristin Davidson. [facility] [place] Department of Neurology  Reason for Referral:   [name] is a 83 y.o. right-handed African-American female referred by Marlowe Kays, PA-C, to characterize her current cognitive functioning and assist with diagnostic clarity and treatment planning in the context of subjective cognitive decline.   Feedback:   [name] completed a comprehensive neuropsychological evaluation on 12/12/2023. Please refer to that encounter for the full report and recommendations. Briefly, results suggested primary impairments surrounding cognitive flexibility, confrontation naming, and non-clock drawing visuospatial abilities. Further performance variability was exhibited across processing speed and all aspects of learning and memory. The cause for her mild dementia presentation is unclear as [name] exhibits a fairly nonspecific testing pattern. Per population base rates, Alzheimer's disease represents the most common neurodegenerative illness which leads to a dementia diagnosis by a wide margin. [name] daughter's description of day-to-day functioning where [name] experiences rapid forgetting and prominent repetition certainly aligns with how this illness can present. However, memory testing remained variable and without any consistent pattern of impairment. While her retention rate was 0% across a list learning task after a brief delay, rates ranged from 60% to 62% across other memory tasks. Likewise, while a recognition performance across a story learning task was quite poor, recognition performances across list and figure memory tasks were adequate. Testing wise, [name]'s memory tests do not exhibit a classic Alzheimer's pattern at the present time. With that being said, deficits in executive functioning, confrontation naming, and visuospatial abilities very commonly occur in Alzheimer's disease and there remains  the potential for this illness to be present and exhibiting a somewhat atypical pattern of progression. However, the presence of this illness cannot be stated with confidence at the present time based upon testing alone. Continued medical monitoring will be very important moving forward.   [name] was accompanied by her daughter during the current feedback session. Content of the current session focused on the results of her neuropsychological evaluation. [name] was given the opportunity to ask questions and her questions were answered. She was encouraged to reach out should additional questions arise. A copy of her report was provided at the conclusion of the visit.      One unit [phone] was billed for Dr. Tammy Sours time spent preparing for, conducting, and documenting the current feedback session with [name].

## 2023-12-20 ENCOUNTER — Ambulatory Visit: Admitting: Podiatry

## 2023-12-20 ENCOUNTER — Encounter: Payer: Self-pay | Admitting: Podiatry

## 2023-12-20 DIAGNOSIS — M79674 Pain in right toe(s): Secondary | ICD-10-CM | POA: Diagnosis not present

## 2023-12-20 DIAGNOSIS — D689 Coagulation defect, unspecified: Secondary | ICD-10-CM | POA: Diagnosis not present

## 2023-12-20 DIAGNOSIS — N1831 Chronic kidney disease, stage 3a: Secondary | ICD-10-CM

## 2023-12-20 DIAGNOSIS — B351 Tinea unguium: Secondary | ICD-10-CM | POA: Diagnosis not present

## 2023-12-20 DIAGNOSIS — M79675 Pain in left toe(s): Secondary | ICD-10-CM | POA: Diagnosis not present

## 2023-12-20 NOTE — Progress Notes (Signed)
This patient returns to my office for at risk foot care.  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  She says she has PAD.  This patient is unable to cut nails herself since the patient cannot reach her nails.These nails are painful walking and wearing shoes.  This patient presents for at risk foot care today.  General Appearance  Alert, conversant and in no acute stress.  Vascular  Dorsalis pedis and posterior tibial  pulses are palpable  bilaterally.  Capillary return is within normal limits  bilaterally. Temperature is within normal limits  bilaterally. Absent digital hair.  Neurologic  Senn-Weinstein monofilament wire test within normal limits  bilaterally. Muscle power within normal limits bilaterally.  Nails Thick disfigured discolored nails with subungual debris  from hallux to fifth toes bilaterally. No evidence of bacterial infection or drainage bilaterally.  Orthopedic  No limitations of motion  feet .  No crepitus or effusions noted.  No bony pathology or digital deformities noted.  HAV  B/L.  Skin  normotropic skin with no porokeratosis noted bilaterally.  No signs of infections or ulcers noted.     Onychomycosis  Pain in right toes  Pain in left toes  Consent was obtained for treatment procedures.   Mechanical debridement of nails 1-5  bilaterally performed with a nail nipper.  Filed with dremel without incident.    Return office visit    4 months                  Told patient to return for periodic foot care and evaluation due to potential at risk complications.   Gardiner Barefoot DPM

## 2024-01-02 ENCOUNTER — Ambulatory Visit: Admitting: Physician Assistant

## 2024-01-14 ENCOUNTER — Telehealth (INDEPENDENT_AMBULATORY_CARE_PROVIDER_SITE_OTHER): Admitting: Physician Assistant

## 2024-01-14 DIAGNOSIS — F03A3 Unspecified dementia, mild, with mood disturbance: Secondary | ICD-10-CM

## 2024-01-14 MED ORDER — MEMANTINE HCL 5 MG PO TABS: ORAL_TABLET | ORAL | 11 refills | Status: AC

## 2024-01-14 NOTE — Progress Notes (Signed)
 Virtual Visit via Video Note The purpose of this virtual visit is to provide medical care in a patient that is unable to be seen in person due to physical or health limitations   Consent was obtained for video visit:  yes  Answered questions that patient had about telehealth interaction:  yes I discussed the limitations, risks, security and privacy concerns of performing an evaluation and management service by telemedicine. I also discussed with the patient that there may be a patient responsible charge related to this service. The patient expressed understanding and agreed to proceed.  Pt location: Home Physician Location: office Name of referring provider:  Renford Dills, MD I connected with Kristin Davidson at patients initiation/request on 01/14/2024 at  9:30 AM EDT by video enabled telemedicine application and verified that I am speaking with the correct person using two identifiers. Pt MRN:  [id number] Pt DOB:  Oct 07, 1941 Video Participants:  Kristin Davidson;  daughter    Assessment and Plan:    Kristin Davidson  is a 83 y.o. RH female with a history of hypertension, hyperlipidemia, GERD with dysphagia, A-fib on Eliquis, COPD, Prediabetes, gout, arthritis, depression, anxiety seen today in follow up for memory loss.  She was last seen on 11/05/2023. Since her last visit she had a neuropsychological evaluation yielding a diagnosis of mild dementia, but etiology unclear, without all evidence for neurodegenerative disease I.e Alzheimer's Disease. She is not on antidementia medicines. Unfortunately she has bradycardia with average pulse in the 50s, for which ACHI cannot be given. Discussed initiating memantine 5 mg bid, she agrees to proceed. Mood is good.   Repeat neuropsych evaluation in 12 to 18 months for diagnostic clarity  Start Memantine 5 mg: Take 1 tablet (5 mg at night) for 2 weeks, then increase to 1 tablet (5 mg) twice a day   Will check plasma markers to help rule out the  presence of Alzheimer's disease. Continue to control mood as per PCP, recommend psychotherapy Continue to replenish B12  History of Present Illness:     Any changes in memory? "There are some good and bad days".  He has some difficulty remembering recent conversations, names and new information.   repeats oneself? Endorsed, as before. Disoriented when walking into a room?  Denies Leaving objects in unusual places?  Denies Ambulates with difficulty?  Denies, she walks 3 days a week.  She has some arthritis and gout that may limit her mobility Recent falls?  Denies Any head injuries? seizures?  Denies Wandering behavior?  Denies Any mood changes?  At times she is more irritable than others.  She may get frustrated more often. Any history of depression?:  She has mild anxiety and may be experiencing some depression.  Hallucinations? Denies Paranoia? Denies Patient reports that sometimes she cannot sleep well  due to leg cramps at night.  Denies vivid dreams, REM behavior or sleepwalking   History of sleep apnea?  Denies Any hygiene concerns?  Denies Independent of bathing and dressing?  Endorsed Does the patient needs help with medications?  Daughter is in charge. Any changes in appetite?  She craves ice cream and other sweets.  She admits to not drinking enough water.  Other than that she has good appetite.  Patient have trouble swallowing?  She has a history of chronic dysphagia due to GERD and hiatal hernia which required dilatations in the past Any headaches?  Denies Any focal numbness or tingling? Any pain?  Denies Unilateral weakness?  Denies  Any tremors?  Denies Any incontinence of urine?  She has a history of urge incontinence, wears depends Any bowel dysfunction?  She has a history of chronic constipation followed by GI, on chronic Linzess She lives with her daughter Patient no longer drives    Neuropsych evaluation 12/12/2023 briefly, results suggested primary impairments  surrounding cognitive flexibility, confrontation naming, and non-clock drawing visuospatial abilities. Further performance variability was exhibited across processing speed and all aspects of learning and memory. The cause for her mild dementia presentation is unclear as [name] exhibits a fairly nonspecific testing pattern. Per population base rates, Alzheimer's disease represents the most common neurodegenerative illness which leads to a dementia diagnosis by a wide margin. [name] daughter's description of day-to-day functioning where [name] experiences rapid forgetting and prominent repetition certainly aligns with how this illness can present. However, memory testing remained variable and without any consistent pattern of impairment. While her retention rate was 0% across a list learning task after a brief delay, rates ranged from 60% to 62% across other memory tasks. Likewise, while a recognition performance across a story learning task was quite poor, recognition performances across list and figure memory tasks were adequate. Testing wise, [name]'s memory tests do not exhibit a classic Alzheimer's pattern at the present time. With that being said, deficits in executive functioning, confrontation naming, and visuospatial abilities very commonly occur in Alzheimer's disease and there remains the potential for this illness to be present and exhibiting a somewhat atypical pattern of progression. However, the presence of this illness cannot be stated with confidence at the present time based upon testing alone. Continued medical monitoring will be very important moving forward.      Initial evaluation 12/25/2022    How long did patient have memory difficulties? For at least 7 .  However, after the patient has moved with her daughter, she noticed that her mother was having difficulty remembering recent conversations, names of people, or instructions.  "Sometimes she thinks that she told  me something but she never did "-daughter says. repeats oneself?  Endorsed by daughter, she may be asking the same question or telling the same story. Disoriented when walking into a room?   "Sometimes she cannot remember what she is doing and walking around in a circle ""we go to the bank she said why are we going here? I never been here before "(is always the same bank that they have been going for a long time).   Leaving objects in unusual places? Endorsed" she puts things that do not belong "  Wandering behavior? denies   Any personality changes ? Endorsed by her daughter.  She may be lying down, and then she has a spurt of energy and then she goes back to bed   Any history of depression?:  Endorsed.  Gets frustrated more often. Hallucinations or paranoia?  denies   Seizures? denies    Any sleep changes?  Endorsed, she is frequent leg cramps at night.  She has a history of peripheral vascular disease, and this is being followed that by her cardiologist, which may be contributing to some of her symptoms.  In addition, she does not drink enough water.  She does not have iron deficiency anemia.  Denies vivid dreams, REM behavior or sleepwalking.    Sleep apnea? denies  Endorsed, but she refuses to use the CPAP Any hygiene concerns?  denies   Independent of bathing and dressing?  Endorsed  Does the patient need help with  medications?  Daughter oversees, but tries to allow her to be in charge of things. Who is in charge of the finances? Patient  is in charge "there is something you cannot do is touch her wallet on her pocketbook ". Any changes in appetite?" She eats and she will say it kept her up all night, some foods don't agree, I do not want to cook for her ".  She does crave ice cream and other sweets Patient have trouble swallowing?  She has a history of chronic dysphagia due to GERD-hiatal hernia requiring dilatation Does the patient cook?  Any kitchen accidents such as leaving the stove on?  Patient denies   Any headaches?  denies   Chronic back pain?  denies   Ambulates with difficulty? She walks outside 3 days a week. Arthritis and gout causes some pain Recent falls or head injuries? denies     Vision changes? She has a history of  Glaucoma, L cataract but they do not affect her ADLs. Unilateral weakness, numbness or tingling?  denies   Any tremors?  denies   Any anosmia?  denies   Any incontinence of urine? Endorsed, she has urge incontinence, needs nDepends  Any bowel dysfunction?    She has a history of chronic constipation, followed by GI, on Linzess Patient lives   daughter History of heavy alcohol intake? denies   History of heavy tobacco use? Denies, quit 5 y ago  Family history of dementia?   no  Does patient drive? no     Prior MRI of the brain performed at [facility] (disc is not available for review), remarkable for normal for age volume loss, without disproportionate hippocampal volume loss.  A new MRI of the brain has been ordered, but the patient did not perform it.   Current Outpatient Medications on File Prior to Visit  Medication Sig Dispense Refill   allopurinol (ZYLOPRIM) 100 MG tablet Take 200 mg by mouth daily.     amLODipine (NORVASC) 10 MG tablet Take 1 tablet (10 mg total) by mouth daily. 90 tablet 3   apixaban (ELIQUIS) 2.5 MG TABS tablet Take 1 tablet (2.5 mg total) by mouth 2 (two) times daily. 60 tablet 11   baclofen (LIORESAL) 10 MG tablet TAKE 1 TABLET(10 MG) BY MOUTH THREE TIMES DAILY AS NEEDED FOR MUSCLE SPASMS 30 tablet 0   benazepril (LOTENSIN) 20 MG tablet Take 1.5 tablets (30 mg total) by mouth daily. 135 tablet 3   DULoxetine (CYMBALTA) 30 MG capsule 1 capsule Orally Once a day for 90 days     furosemide (LASIX) 20 MG tablet Take 1 tablet (20 mg total) by mouth daily. Take one 20mg  tablet daily. May take an extra tablet daily as needed for swelling or weight gain of 2 pounds overnight or 5 pounds in one week. 120 tablet 3   gabapentin  (NEURONTIN) 100 MG capsule Take 200 mg by mouth daily.     Homeopathic Products (THERAWORX RELIEF) FOAM See admin instructions.     linaclotide (LINZESS) 145 MCG CAPS capsule Take 1 capsule (145 mcg total) by mouth daily. 90 capsule 0   pantoprazole (PROTONIX) 40 MG tablet Take 1 tablet (40 mg total) by mouth 2 (two) times daily. 60 tablet 0   terconazole (TERAZOL 7) 0.4 % vaginal cream at bedtime.     No current facility-administered medications on file prior to visit.     Observations/Objective:   There were no vitals filed for this visit. GEN:  The patient  appears stated age and is in NAD.  Neurological examination: Patient is awake, alert, oriented to person, place but not to date . No aphasia or dysarthria. Intact fluency,  decreased  comprehension. Remote and recent memory impaired. Unable to repeat phrases. Cranial nerves: Extraocular movements intact with no nystagmus. No facial asymmetry. Motor: moves all extremities symmetrically, at least anti-gravity x 4. No incoordination on FNT. Gait: narrow-based and steady, able to tandem walk adequately.      Follow Up Instructions:    -I discussed the assessment and treatment plan with the patient. The patient was provided an opportunity to ask questions and all were answered. The patient agreed with the plan and demonstrated an understanding of the instructions.   The patient was advised to call back or seek an in-person evaluation if the symptoms worsen or if the condition fails to improve as anticipated.    Total time spent on today's visit was 22 minutes, including both face-to-face time and nonface-to-face time.  Time included that spent on review of records (prior notes available to me/labs/imaging if pertinent), discussing treatment and goals, answering patient's questions and coordinating care.   Marlowe Kays, PA-C

## 2024-01-22 ENCOUNTER — Telehealth: Payer: Self-pay | Admitting: Physician Assistant

## 2024-01-22 NOTE — Telephone Encounter (Signed)
 Patients daughter called asking for a refill on Linzess.

## 2024-01-23 MED ORDER — LINACLOTIDE 145 MCG PO CAPS: 145.0000 ug | ORAL_CAPSULE | Freq: Every day | ORAL | 0 refills | Status: DC

## 2024-01-23 NOTE — Telephone Encounter (Signed)
 Script sent to pharmacy.

## 2024-01-23 NOTE — Telephone Encounter (Signed)
 Patients daughter called to follow up on refill for Linzess. States she has been out of medication for a few days and its been almost a week since she has had a bowel movement. Requesting a call to be advised when refill has been sent. Please advise.

## 2024-02-04 ENCOUNTER — Other Ambulatory Visit: Payer: Self-pay | Admitting: Internal Medicine

## 2024-02-04 DIAGNOSIS — R059 Cough, unspecified: Secondary | ICD-10-CM | POA: Diagnosis not present

## 2024-02-04 DIAGNOSIS — M5430 Sciatica, unspecified side: Secondary | ICD-10-CM | POA: Diagnosis not present

## 2024-02-04 DIAGNOSIS — R0601 Orthopnea: Secondary | ICD-10-CM | POA: Diagnosis not present

## 2024-02-10 ENCOUNTER — Telehealth: Payer: Self-pay | Admitting: Psychology

## 2024-02-10 NOTE — Telephone Encounter (Signed)
 error

## 2024-02-17 DIAGNOSIS — H40023 Open angle with borderline findings, high risk, bilateral: Secondary | ICD-10-CM | POA: Diagnosis not present

## 2024-02-18 ENCOUNTER — Ambulatory Visit
Admission: RE | Admit: 2024-02-18 | Discharge: 2024-02-18 | Disposition: A | Discharge status: Home / Self Care | Source: Ambulatory Visit | Attending: Internal Medicine | Admitting: Internal Medicine

## 2024-02-18 DIAGNOSIS — R1310 Dysphagia, unspecified: Secondary | ICD-10-CM | POA: Diagnosis not present

## 2024-02-18 DIAGNOSIS — R059 Cough, unspecified: Secondary | ICD-10-CM

## 2024-02-18 DIAGNOSIS — K449 Diaphragmatic hernia without obstruction or gangrene: Secondary | ICD-10-CM | POA: Diagnosis not present

## 2024-02-27 ENCOUNTER — Telehealth: Payer: Self-pay

## 2024-02-27 MED ORDER — PANTOPRAZOLE SODIUM 40 MG PO TBEC: 40.0000 mg | DELAYED_RELEASE_TABLET | Freq: Two times a day (BID) | ORAL | 0 refills | Status: DC

## 2024-02-27 NOTE — Telephone Encounter (Signed)
 Pharmacy requested pantoprazole . Refilled and note attached to call for appointment.

## 2024-03-07 DIAGNOSIS — N1832 Chronic kidney disease, stage 3b: Secondary | ICD-10-CM | POA: Diagnosis not present

## 2024-03-07 DIAGNOSIS — N1831 Chronic kidney disease, stage 3a: Secondary | ICD-10-CM | POA: Diagnosis not present

## 2024-03-07 DIAGNOSIS — M810 Age-related osteoporosis without current pathological fracture: Secondary | ICD-10-CM | POA: Diagnosis not present

## 2024-03-11 DIAGNOSIS — G3184 Mild cognitive impairment, so stated: Secondary | ICD-10-CM | POA: Diagnosis not present

## 2024-03-11 DIAGNOSIS — E1169 Type 2 diabetes mellitus with other specified complication: Secondary | ICD-10-CM | POA: Diagnosis not present

## 2024-03-11 DIAGNOSIS — I739 Peripheral vascular disease, unspecified: Secondary | ICD-10-CM | POA: Diagnosis not present

## 2024-03-11 DIAGNOSIS — M810 Age-related osteoporosis without current pathological fracture: Secondary | ICD-10-CM | POA: Diagnosis not present

## 2024-03-11 DIAGNOSIS — M62838 Other muscle spasm: Secondary | ICD-10-CM | POA: Diagnosis not present

## 2024-03-11 DIAGNOSIS — N1832 Chronic kidney disease, stage 3b: Secondary | ICD-10-CM | POA: Diagnosis not present

## 2024-03-11 DIAGNOSIS — I480 Paroxysmal atrial fibrillation: Secondary | ICD-10-CM | POA: Diagnosis not present

## 2024-03-11 DIAGNOSIS — R059 Cough, unspecified: Secondary | ICD-10-CM | POA: Diagnosis not present

## 2024-03-11 DIAGNOSIS — N1831 Chronic kidney disease, stage 3a: Secondary | ICD-10-CM | POA: Diagnosis not present

## 2024-03-11 DIAGNOSIS — M4800 Spinal stenosis, site unspecified: Secondary | ICD-10-CM | POA: Diagnosis not present

## 2024-03-18 ENCOUNTER — Telehealth: Payer: Self-pay | Admitting: Physician Assistant

## 2024-03-18 NOTE — Telephone Encounter (Signed)
 I left message on phone to call office, she can resume medication per Griffin Lecher.

## 2024-03-18 NOTE — Telephone Encounter (Signed)
 Pt.s daughter Dina Francisco called to discuss Rx Memantine  that was to be increased but it makes her sick, call to assist with meds. Pt. Refusing to continue meds

## 2024-04-09 DIAGNOSIS — R062 Wheezing: Secondary | ICD-10-CM | POA: Diagnosis not present

## 2024-04-09 DIAGNOSIS — J40 Bronchitis, not specified as acute or chronic: Secondary | ICD-10-CM | POA: Diagnosis not present

## 2024-04-09 DIAGNOSIS — R051 Acute cough: Secondary | ICD-10-CM | POA: Diagnosis not present

## 2024-04-22 ENCOUNTER — Telehealth: Payer: Self-pay | Admitting: Physician Assistant

## 2024-04-22 NOTE — Telephone Encounter (Signed)
 Patient's daughter called requesting refill for pantoprazole . Patient has been schedule for 8/27. Please advise, thank you.

## 2024-04-23 MED ORDER — PANTOPRAZOLE SODIUM 40 MG PO TBEC: 40.0000 mg | DELAYED_RELEASE_TABLET | Freq: Two times a day (BID) | ORAL | 0 refills | Status: DC

## 2024-04-23 NOTE — Telephone Encounter (Signed)
 Refill sent to pharmacy.

## 2024-05-04 ENCOUNTER — Other Ambulatory Visit: Payer: Self-pay

## 2024-05-04 MED ORDER — LINACLOTIDE 145 MCG PO CAPS: 145.0000 ug | ORAL_CAPSULE | Freq: Every day | ORAL | 0 refills | Status: DC

## 2024-05-04 NOTE — Telephone Encounter (Signed)
 Linzess  145 mcg refilled as pharmacy requested. Kristin Davidson has upcoming appointment in August.

## 2024-05-11 DIAGNOSIS — M4800 Spinal stenosis, site unspecified: Secondary | ICD-10-CM | POA: Diagnosis not present

## 2024-05-11 DIAGNOSIS — G3184 Mild cognitive impairment, so stated: Secondary | ICD-10-CM | POA: Diagnosis not present

## 2024-05-11 DIAGNOSIS — Z0000 Encounter for general adult medical examination without abnormal findings: Secondary | ICD-10-CM | POA: Diagnosis not present

## 2024-05-11 DIAGNOSIS — E1169 Type 2 diabetes mellitus with other specified complication: Secondary | ICD-10-CM | POA: Diagnosis not present

## 2024-05-11 DIAGNOSIS — I739 Peripheral vascular disease, unspecified: Secondary | ICD-10-CM | POA: Diagnosis not present

## 2024-05-11 DIAGNOSIS — I480 Paroxysmal atrial fibrillation: Secondary | ICD-10-CM | POA: Diagnosis not present

## 2024-06-03 ENCOUNTER — Encounter: Payer: Self-pay | Admitting: Physician Assistant

## 2024-06-03 ENCOUNTER — Ambulatory Visit (INDEPENDENT_AMBULATORY_CARE_PROVIDER_SITE_OTHER): Admitting: Physician Assistant

## 2024-06-03 VITALS — BP 120/60 | HR 56 | Ht 59.0 in | Wt 163.0 lb

## 2024-06-03 DIAGNOSIS — K5909 Other constipation: Secondary | ICD-10-CM

## 2024-06-03 DIAGNOSIS — K219 Gastro-esophageal reflux disease without esophagitis: Secondary | ICD-10-CM

## 2024-06-03 DIAGNOSIS — K5900 Constipation, unspecified: Secondary | ICD-10-CM | POA: Diagnosis not present

## 2024-06-03 MED ORDER — PANTOPRAZOLE SODIUM 40 MG PO TBEC: 40.0000 mg | DELAYED_RELEASE_TABLET | Freq: Two times a day (BID) | ORAL | 3 refills | Status: AC

## 2024-06-03 MED ORDER — LINACLOTIDE 145 MCG PO CAPS: 145.0000 ug | ORAL_CAPSULE | Freq: Every day | ORAL | 3 refills | Status: AC

## 2024-06-03 NOTE — Progress Notes (Signed)
 Chief Complaint: Follow-up GERD and constipation  HPI:    Kristin Davidson is an 83 year old African-American female with a past medical history as listed below including A-fib on Eliquis , CKD stage III, COPD, GERD, GAD and multiple others, known to Dr. Leigh, who presents to clinic today for follow-up of her GERD and constipation.      10/13/2018 colonoscopy with 1 5 mm polyp and diverticulosis.  Pathology showed hyperplastic polyp and no repeat colonoscopy is recommended.     04/13/2021 office visit with me to discuss left upper quadrant pain indigestion, constipation and GERD.  At that time increase Omeprazole  to 40 twice daily and prescribed Linzess  145 daily.  Also ended up scheduling EGD.       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  Biopsies are normal.    08/14/2022 patient discussed occasional issues with dysphagia.  Also discussed she did not wear dentures well.  Occasional reflux regardless of Omeprazole  40 twice daily.  Stayed on Linzess  145 mcg daily.  At that point changed to Pantoprazole  40 twice daily as she could not afford Famotidine.  Discussed antidysphagia measures.  Discussed that she not benefit from switching to Pantoprazole  then would recommend a barium esophagram tablet.  Continued on Linzess  145 mcg daily.    02/18/2024 esophagram with small sliding-type hiatal hernia, mild tertiary contractions suggesting presbyesophagus and no other definite abnormality.    Today, patient presents to clinic accompanied by her daughter.  GI wise she is doing well on Linzess  145 mcg daily which keeps her bowel movements moving when she takes it.  She typically takes it every 2 to 3 days.  She likes this medication does not want to change.    Also continues on Pantoprazole  40 mg twice a day which has helped with her occasional dysphagia.  Every once a while if she eats the  wrong food she will still get a sensation of reflux, but for the most part does well.  This is only every once in a while.    Patient's biggest complaint today is her legs for which she is following up with her orthopedic doctor soon.  Daughter relays a new diagnosis of early Alzheimer's.    Denies fever, chills or weight loss.  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/08/2021   Cataract 05/02/2021   only R eye at this time   Chronic kidney disease, stage 3a 11/09/2020   Chronic obstructive pulmonary disease, unspecified 11/09/2020   Colitis    Diastolic dysfunction 11/09/2020   Essential hypertension 08/06/2007   Excessive thirst 11/09/2020   Generalized anxiety disorder    GERD 08/06/2007   Gout    Lower back pain 05/02/2021   Mild dementia, unclear etiology 12/12/2023   Obstructive sleep apnea    suspected but never formally diagnosed per daughter   Paroxysmal atrial fibrillation 05/19/2020   Peripheral arterial disease 07/19/2021   left worse than right   Seborrheic keratosis 08/06/2007   Secondary hypercoagulable state 05/19/2020   Solitary cyst of breast 08/06/2007   Spinal stenosis of lumbar region 11/07/2021   Type II diabetes mellitus     Past Surgical History:  Procedure Laterality Date   ABDOMINAL HYSTERECTOMY     ABDOMINAL SURGERY  BREAST EXCISIONAL BIOPSY Right >10+ yrs ago   benign   COLONOSCOPY     KNEE SURGERY      Current Outpatient Medications  Medication Sig Dispense Refill   allopurinol  (ZYLOPRIM ) 100 MG tablet Take 200 mg by mouth daily.     amLODipine  (NORVASC ) 10 MG tablet Take 1 tablet (10 mg total) by mouth daily. 90 tablet 3   apixaban  (ELIQUIS ) 2.5 MG TABS tablet Take 1 tablet (2.5 mg total) by mouth 2 (two) times daily. 60 tablet 11   baclofen  (LIORESAL ) 10 MG tablet TAKE 1 TABLET(10 MG) BY MOUTH THREE  TIMES DAILY AS NEEDED FOR MUSCLE SPASMS 30 tablet 0   benazepril  (LOTENSIN ) 20 MG tablet Take 1.5 tablets (30 mg total) by mouth daily. 135 tablet 3   diclofenac Sodium (VOLTAREN) 1 % GEL Apply 2 g topically 2 (two) times daily as needed.     DULoxetine (CYMBALTA) 30 MG capsule 1 capsule Orally Once a day for 90 days     furosemide  (LASIX ) 20 MG tablet Take 1 tablet (20 mg total) by mouth daily. Take one 20mg  tablet daily. May take an extra tablet daily as needed for swelling or weight gain of 2 pounds overnight or 5 pounds in one week. 120 tablet 3   gabapentin (NEURONTIN) 100 MG capsule Take 200 mg by mouth daily.     Homeopathic Products (THERAWORX RELIEF) FOAM See admin instructions.     HYDROcodone bit-homatropine (HYCODAN) 5-1.5 MG/5ML syrup Take 5 mLs by mouth as needed.     linaclotide  (LINZESS ) 145 MCG CAPS capsule Take 1 capsule (145 mcg total) by mouth daily. 90 capsule 0   pantoprazole  (PROTONIX ) 40 MG tablet Take 1 tablet (40 mg total) by mouth 2 (two) times daily. 60 tablet 0   rosuvastatin (CRESTOR) 5 MG tablet Take 5 mg by mouth daily.     terconazole (TERAZOL 7) 0.4 % vaginal cream at bedtime.     memantine  (NAMENDA ) 5 MG tablet Take 1 tablet (5 mg at night) for 2 weeks, then increase to 1 tablet (5 mg) twice a day (Patient not taking: Reported on 06/03/2024) 60 tablet 11   No current facility-administered medications for this visit.    Allergies as of 06/03/2024 - Review Complete 06/03/2024  Allergen Reaction Noted   Fosamax [alendronate sodium] Other (See Comments) 08/08/2020   Hydromorphone hcl  06/10/2009   Naproxen sodium Itching    Sulfonamide derivatives     Tramadol Hives 02/02/2022   Tramadol hcl Other (See Comments)    Naproxen Rash 02/02/2022    Family History  Problem Relation Age of Onset   Bone cancer Mother    Heart attack Father 86   Colon cancer Neg Hx    Esophageal cancer Neg Hx    Rectal cancer Neg Hx    Stomach cancer Neg Hx    Colon polyps Neg  Hx     Social History   Socioeconomic History   Marital status: Widowed    Spouse name: Not on file   Number of children: 3   Years of education: 48   Highest education level: High school graduate  Occupational History   Occupation: Retired  Tobacco Use   Smoking status: Former    Current packs/day: 0.00    Average packs/day: 1 pack/day for 59.0 years (59.0 ttl pk-yrs)    Types: Cigarettes    Start date: 06/09/1959    Quit date: 06/08/2018    Years since quitting: 5.9    Passive  exposure: Past   Smokeless tobacco: Never  Vaping Use   Vaping status: Never Used  Substance and Sexual Activity   Alcohol use: Not Currently   Drug use: Never   Sexual activity: Not on file  Other Topics Concern   Not on file  Social History Narrative   Right handed   Drinks caffeine   Lives with daughter   One story home   retired   Chief Executive Officer [employer] investment banker Strain: Not on file  Food Insecurity: Not on file  Transportation Needs: Not on file  Physical Activity: Not on file  Stress: Not on file  Social Connections: Unknown (12/25/2022)   Received from [employer]   Social Network    Social Network: Not on file  Intimate Partner Violence: Unknown (12/25/2022)   Received from [facility]   HITS    Physically Hurt: Not on file    Insult or Talk Down To: Not on file    Threaten Physical Harm: Not on file    Scream or Curse: Not on file    Review of Systems:    Constitutional: No weight loss, fever or chills Cardiovascular: No chest pain Respiratory: No SOB Gastrointestinal: See HPI and otherwise negative   Physical Exam:  Vital signs: BP 120/60 (BP Location: Left Arm, Patient Position: Sitting)   Pulse (!) 56   Ht 4' 11 (1.499 m) Comment: height measured without shoes  Wt 163 lb (73.9 kg)   BMI 32.92 kg/m    Constitutional:   Pleasant elderly AA female appears to be in NAD, Well developed, Well nourished, alert and cooperative Respiratory: Respirations  even and unlabored. Lungs clear to auscultation bilaterally.   No wheezes, crackles, or rhonchi.  Cardiovascular: Normal S1, S2. No MRG. Regular rate and rhythm. No peripheral edema, cyanosis or pallor.  Gastrointestinal:  Soft, nondistended, nontender. No rebound or guarding. Normal bowel sounds. No appreciable masses or hepatomegaly. Rectal:  Not performed.  Psychiatric:  Demonstrates good judgement and reason without abnormal affect or behaviors.  RELEVANT LABS AND IMAGING: CBC    Component Value Date/Time   WBC 5.5 05/09/2020 0553   RBC 4.65 05/09/2020 0553   HGB 13.1 05/09/2020 0553   HCT 42.3 05/09/2020 0553   PLT 143 (L) 05/09/2020 0553   MCV 91.0 05/09/2020 0553   MCH 28.2 05/09/2020 0553   MCHC 31.0 05/09/2020 0553   RDW 13.2 05/09/2020 0553   LYMPHSABS 1.2 05/09/2020 0553   MONOABS 0.6 05/09/2020 0553   EOSABS 0.2 05/09/2020 0553   BASOSABS 0.0 05/09/2020 0553    CMP     Component Value Date/Time   NA 141 05/30/2022 1044   K 4.6 05/30/2022 1044   CL 102 05/30/2022 1044   CO2 22 05/30/2022 1044   GLUCOSE 156 (H) 05/30/2022 1044   GLUCOSE 119 (H) 05/09/2020 0553   BUN 22 05/30/2022 1044   CREATININE 1.61 (H) 05/30/2022 1044   CALCIUM 9.6 05/30/2022 1044   PROT 6.2 (L) 05/09/2020 0553   ALBUMIN 3.4 (L) 05/09/2020 0553   AST 23 05/09/2020 0553   ALT 24 05/09/2020 0553   ALKPHOS 68 05/09/2020 0553   BILITOT 0.4 05/09/2020 0553   GFRNONAA 38 (L) 07/25/2020 1120   GFRAA 43 (L) 07/25/2020 1120    Assessment: 1.  GERD: Moderately controlled on Pantoprazole  40 twice daily, occasional breakthrough depending on her diet as well as some occasional symptoms of dysphagia, recent barium esophagram with presbyesophagus and tertiary contractions which are likely  contributing most recent EGD in 2022 with empiric dilation 2.  Constipation: Doing well on Linzess  145 mcg daily  Plan: 1.  Refill Linzess  145 mcg daily #90 with 3 refills 2.  Refill Pantoprazole  40 mg twice daily  #180 with 3 refills. 3.  Discussed with the patient and her daughter that if she has breakthrough reflux symptoms based on her diet then can use over-the-counter Famotidine 20 mg up to twice daily.  Had previously tried to prescribe this but it was too expensive for her. 4.  Reviewed antireflux diet and lifestyle modifications. 5.  Patient to follow in clinic in a year or sooner if necessary.  Delon Failing, PA-C [facility] 06/03/2024, 10:21 AM  Cc: Kristin Ingle, MD

## 2024-06-03 NOTE — Patient Instructions (Addendum)
 We have sent the following medications to your pharmacy for you to pick up at your convenience:  Pantoprazole , Linzess   _______________________________________________________  If your blood pressure at your visit was 140/90 or greater, please contact your primary care physician to follow up on this.  _______________________________________________________  If you are age 83 or older, your body mass index should be between 23-30. Your Body mass index is 32.92 kg/m. If this is out of the aforementioned range listed, please consider follow up with your Primary Care Provider.  If you are age 19 or younger, your body mass index should be between 19-25. Your Body mass index is 32.92 kg/m. If this is out of the aformentioned range listed, please consider follow up with your Primary Care Provider.   ________________________________________________________  The [facility] providers would like to encourage you to use MYCHART to communicate with providers for non-urgent requests or questions.  Due to long hold times on the telephone, sending your provider a message by [facility] may be a faster and more efficient way to get a response.  Please allow 48 business hours for a response.  Please remember that this is for non-urgent requests.  _______________________________________________________  [facility] is using a team-based approach to care.  Your team is made up of your doctor and two to three APPS. Our APPS (Nurse Practitioners and Physician Assistants) work with your physician to ensure care continuity for you. They are fully qualified to address your health concerns and develop a treatment plan. They communicate directly with your gastroenterologist to care for you. Seeing the Advanced Practice Practitioners on your physician's team can help you by facilitating care more promptly, often allowing for earlier appointments, access to diagnostic testing, procedures, and other specialty  referrals.    Due to recent changes in healthcare laws, you may see the results of your imaging and laboratory studies on MyChart before your provider has had a chance to review them.  We understand that in some cases there may be results that are confusing or concerning to you. Not all laboratory results come back in the same time frame and the provider may be waiting for multiple results in order to interpret others.  Please give us  48 hours in order for your provider to thoroughly review all the results before contacting the office for clarification of your results.    SABRA

## 2024-06-06 NOTE — Progress Notes (Signed)
 Agree with assessment and plan as outlined.

## 2024-06-07 DIAGNOSIS — N1831 Chronic kidney disease, stage 3a: Secondary | ICD-10-CM | POA: Diagnosis not present

## 2024-06-07 DIAGNOSIS — M810 Age-related osteoporosis without current pathological fracture: Secondary | ICD-10-CM | POA: Diagnosis not present

## 2024-06-07 DIAGNOSIS — N1832 Chronic kidney disease, stage 3b: Secondary | ICD-10-CM | POA: Diagnosis not present

## 2024-06-11 ENCOUNTER — Encounter: Payer: Self-pay | Admitting: Podiatry

## 2024-06-11 ENCOUNTER — Ambulatory Visit: Admitting: Podiatry

## 2024-06-11 DIAGNOSIS — B351 Tinea unguium: Secondary | ICD-10-CM | POA: Diagnosis not present

## 2024-06-11 DIAGNOSIS — M79675 Pain in left toe(s): Secondary | ICD-10-CM

## 2024-06-11 DIAGNOSIS — M79674 Pain in right toe(s): Secondary | ICD-10-CM | POA: Diagnosis not present

## 2024-06-11 DIAGNOSIS — N1831 Chronic kidney disease, stage 3a: Secondary | ICD-10-CM | POA: Diagnosis not present

## 2024-06-11 DIAGNOSIS — D689 Coagulation defect, unspecified: Secondary | ICD-10-CM | POA: Diagnosis not present

## 2024-06-11 NOTE — Progress Notes (Signed)
This patient returns to my office for at risk foot care.  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  She says she has PAD.  This patient is unable to cut nails herself since the patient cannot reach her nails.These nails are painful walking and wearing shoes.  This patient presents for at risk foot care today.  General Appearance  Alert, conversant and in no acute stress.  Vascular  Dorsalis pedis and posterior tibial  pulses are palpable  bilaterally.  Capillary return is within normal limits  bilaterally. Temperature is within normal limits  bilaterally. Absent digital hair.  Neurologic  Senn-Weinstein monofilament wire test within normal limits  bilaterally. Muscle power within normal limits bilaterally.  Nails Thick disfigured discolored nails with subungual debris  from hallux to fifth toes bilaterally. No evidence of bacterial infection or drainage bilaterally.  Orthopedic  No limitations of motion  feet .  No crepitus or effusions noted.  No bony pathology or digital deformities noted.  HAV  B/L.  Skin  normotropic skin with no porokeratosis noted bilaterally.  No signs of infections or ulcers noted.     Onychomycosis  Pain in right toes  Pain in left toes  Consent was obtained for treatment procedures.   Mechanical debridement of nails 1-5  bilaterally performed with a nail nipper.  Filed with dremel without incident.    Return office visit    4 months                  Told patient to return for periodic foot care and evaluation due to potential at risk complications.   Gardiner Barefoot DPM

## 2024-07-20 ENCOUNTER — Other Ambulatory Visit: Payer: Self-pay

## 2024-07-20 ENCOUNTER — Other Ambulatory Visit (HOSPITAL_BASED_OUTPATIENT_CLINIC_OR_DEPARTMENT_OTHER): Payer: Self-pay | Admitting: Family

## 2024-07-20 DIAGNOSIS — I10 Essential (primary) hypertension: Secondary | ICD-10-CM

## 2024-07-20 DIAGNOSIS — I480 Paroxysmal atrial fibrillation: Secondary | ICD-10-CM

## 2024-07-20 NOTE — Telephone Encounter (Addendum)
 Eliquis  2.5mg  refill request received. Patient is 83 years old, weight-73.9kg, Crea-1.4 on 09/09/23 via [facility] from [facility], Diagnosis-Afib, and last seen by Reche Finder on 06/11/23-needs an appointment.  Will send in refill to requested pharmacy.    Discussed with PharmD to continue dose and once patient has an appointment can order labs and adjust dose as needed.   Message sent to Schedulers for an appointment

## 2024-07-20 NOTE — Progress Notes (Addendum)
 Kristin Davidson                                          MRN: [id number]   08/18/2024   The [facility]I Quality Team Specialist reviewed this patient medical record for the purposes of chart review for care gap closure. The following were reviewed: chart review for care gap closure-kidney health evaluation for diabetes:uACR.    VBCI Quality Team

## 2024-07-21 ENCOUNTER — Other Ambulatory Visit (HOSPITAL_COMMUNITY): Payer: Self-pay

## 2024-07-21 MED ORDER — AMLODIPINE BESYLATE 10 MG PO TABS
10.0000 mg | ORAL_TABLET | Freq: Every day | ORAL | 0 refills | Status: AC
  Filled 2024-07-21: qty 30, 30d supply, fill #0

## 2024-07-23 ENCOUNTER — Ambulatory Visit: Admitting: Physician Assistant

## 2024-07-23 ENCOUNTER — Other Ambulatory Visit: Payer: Self-pay | Admitting: Family

## 2024-07-23 DIAGNOSIS — I10 Essential (primary) hypertension: Secondary | ICD-10-CM

## 2024-07-31 ENCOUNTER — Other Ambulatory Visit (HOSPITAL_COMMUNITY): Payer: Self-pay

## 2024-08-05 ENCOUNTER — Other Ambulatory Visit: Payer: Self-pay | Admitting: Family

## 2024-08-05 DIAGNOSIS — I10 Essential (primary) hypertension: Secondary | ICD-10-CM

## 2024-08-06 MED ORDER — BENAZEPRIL HCL 20 MG PO TABS: 30.0000 mg | ORAL_TABLET | Freq: Every day | ORAL | 0 refills | Status: DC

## 2024-08-09 NOTE — Progress Notes (Incomplete)
 Assessment/Plan:    Mild Dementio   Kristin Davidson is a very pleasant 83 y.o. 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  presenting today in follow-up for evaluation of memory loss. Patient is on memantine  5 mg bid, tolerating well. Mood is good. ***.     Recommendations:   Follow up in   months. Continue Memantine  5 mg twice daily. Side effects were discussed  Repeat neuropsych evaluation in 6-12 months for diagnostic clarity Recommend good control of cardiovascular risk factors Continue to control mood as per PCP Recommend psychotherapy for anxiety ***     Subjective:   This patient is accompanied in the office by her daughte  who supplements the history. Previous records as well as any outside records available were reviewed prior to todays visit.   Patient was last seen on 01/14/24 via video visit.    Any changes in memory since last visit? Some good and not so good days. Patient has some difficulty remembering recent conversations and names of people LTM is fir.. repeats oneself?  Endorsed Disoriented when walking into a room?  Patient denies ***  Misplacing objects?  Patient denies   Wandering behavior?   Denies. Any personality changes since last visit? She has moments of irritability and frustration. Any worsening depression?:She has some anxiety and may be experiencing situational depression. She is yet to see a veterinary surgeon.   Hallucinations or paranoia?  Denies.   Seizures?   Denies.    Any sleep changes?  Does not sleep very well attributed to nightly leg cramps. Denies vivid dreams, REM behavior or sleepwalking   Sleep apnea?   denies ***  Any hygiene concerns?   Denies.   Independent of bathing and dressing?   Endorsed  Does the patient needs help with medications? Daughter is in charge *** Who is in charge of the finances?  Daughter is in charge   *** Any changes in appetite?  Denies. She admits to not drinking plenty water. ***   Patient have trouble swallowing?  She has chronic dysphagia due to GERD, hiatal hernia, requiring periodic esophageal dilatations. Does the patient cook?  Any kitchen accidents such as leaving the stove on?   Denies.   Any headaches?    Denies.   Vision changes? Denies. Chronic pain?  Denies.   Ambulates with difficulty? She has arthritis which at times may limit her mobility. She walks 3 times a week.  ***  Recent falls or head injuries?    Denies.      Unilateral weakness, numbness or tingling?  Denies.   Any tremors?  Denies.   Any anosmia?    Denies.   Any incontinence of urine? Urge incontinence, wears Depends Any bowel dysfunction? Chronic constipation, on Linzess  Patient lives with her daughter .*** Does the patient drive?no longer drives  Neuropsych evaluation 12/12/2023 briefly, results suggested primary impairments surrounding cognitive flexibility, confrontation naming, and non-clock drawing visuospatial abilities. Further performance variability was exhibited across processing speed and all aspects of learning and memory. The cause for her mild dementia presentation is unclear as [name] exhibits a fairly nonspecific testing pattern. Per population base rates, Alzheimer's disease represents the most common neurodegenerative illness which leads to a dementia diagnosis by a wide margin. [name] daughter's description of day-to-day functioning where [name] experiences  rapid forgetting and prominent repetition certainly aligns with how this illness can present. However, memory testing remained variable and without any consistent pattern of impairment. While her retention rate was 0% across a list learning task after a brief delay, rates ranged from 60%  to 62% across other memory tasks. Likewise, while a recognition performance across a story learning task was quite poor, recognition performances across list and figure memory tasks were adequate. Testing wise, [name]'s memory tests do not exhibit a classic Alzheimer's pattern at the present time. With that being said, deficits in executive functioning, confrontation naming, and visuospatial abilities very commonly occur in Alzheimer's disease and there remains the potential for this illness to be present and exhibiting a somewhat atypical pattern of progression. However, the presence of this illness cannot be stated with confidence at the present time based upon testing alone. Continued medical monitoring will be very important moving forward.       Initial evaluation 12/25/2022    How long did patient have memory difficulties? For at least 7 .  However, after the patient has moved with her daughter, she noticed that her mother was having difficulty remembering recent conversations, names of people, or instructions.  Sometimes she thinks that she told me something but she never did -daughter says. repeats oneself?  Endorsed by daughter, she may be asking the same question or telling the same story. Disoriented when walking into a room?   Sometimes she cannot remember what she is doing and walking around in a circle we go to the bank she said why are we going here? I never been here before (is always the same bank that they have been going for a long time).   Leaving objects in unusual places? Endorsed she puts things that do not belong   Wandering behavior? denies   Any personality changes ? Endorsed by her daughter.  She may be lying down, and then she has a spurt of energy and then she goes back to bed   Any history of depression?:  Endorsed.  Gets frustrated more often. Hallucinations or paranoia?  denies   Seizures? denies    Any sleep changes?  Endorsed, she is frequent leg cramps  at night.  She has a history of peripheral vascular disease, and this is being followed that by her cardiologist, which may be contributing to some of her symptoms.  In addition, she does not drink enough water.  She does not have iron deficiency anemia.  Denies vivid dreams, REM behavior or sleepwalking.    Sleep apnea? denies  Endorsed, but she refuses to use the CPAP Any hygiene concerns?  denies   Independent of bathing and dressing?  Endorsed  Does the patient need help with medications?  Daughter oversees, but tries to allow her to be in charge of things. Who is in charge of the finances? Patient  is in charge there is something you cannot do is touch her wallet on her pocketbook . Any changes in appetite? She eats and she will say it kept her up all night, some foods don't agree, I do not want to cook for her .  She does crave ice cream and other sweets Patient have trouble swallowing?  She has a history of chronic dysphagia due to GERD-hiatal hernia requiring dilatation Does the patient cook?  Any kitchen accidents such as leaving the stove on? Patient denies   Any headaches?  denies   Chronic back pain?  denies   Ambulates with  difficulty? She walks outside 3 days a week. Arthritis and gout causes some pain Recent falls or head injuries? denies     Vision changes? She has a history of  Glaucoma, L cataract but they do not affect her ADLs. Unilateral weakness, numbness or tingling?  denies   Any tremors?  denies   Any anosmia?  denies   Any incontinence of urine? Endorsed, she has urge incontinence, needs nDepends  Any bowel dysfunction?    She has a history of chronic constipation, followed by GI, on Linzess  Patient lives   daughter History of heavy alcohol intake? denies   History of heavy tobacco use? Denies, quit 5 y ago  Family history of dementia?   no  Does patient drive? 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   Cataract 05/02/2021   only R eye at this time   Chronic kidney disease, stage 3a 11/09/2020   Chronic obstructive pulmonary disease, unspecified 11/09/2020   Colitis    Diastolic dysfunction 11/09/2020   Essential hypertension 08/06/2007   Excessive thirst 11/09/2020   Generalized anxiety disorder    GERD 08/06/2007   Gout    Lower back pain 05/02/2021   Mild dementia, unclear etiology 12/12/2023   Obstructive sleep apnea    suspected but never formally diagnosed per daughter   Paroxysmal atrial fibrillation 05/19/2020   Peripheral arterial disease 07/19/2021   left worse than right   Seborrheic keratosis 08/06/2007   Secondary hypercoagulable state 05/19/2020   Solitary cyst of breast 08/06/2007   Spinal stenosis of lumbar region 11/07/2021   Type II diabetes mellitus      Past Surgical History:  Procedure Laterality Date   ABDOMINAL HYSTERECTOMY     ABDOMINAL SURGERY     BREAST EXCISIONAL BIOPSY Right >10+ yrs ago   benign   COLONOSCOPY     KNEE SURGERY       PREVIOUS MEDICATIONS:   CURRENT MEDICATIONS:  Outpatient Encounter Medications as of 08/11/2024  Medication Sig   allopurinol  (ZYLOPRIM ) 100 MG tablet Take 200 mg by mouth daily.   amLODipine  (NORVASC ) 10 MG tablet Take 1 tablet (10 mg total) by mouth daily.   apixaban  (ELIQUIS ) 2.5 MG TABS tablet Take 1 tablet (2.5 mg total) by mouth 2 (two) times daily. Please call the office at [phone] to schedule an appointment.   baclofen  (LIORESAL ) 10 MG tablet TAKE 1 TABLET(10 MG) BY MOUTH THREE TIMES DAILY AS NEEDED FOR MUSCLE SPASMS   benazepril  (LOTENSIN ) 20 MG tablet Take 1.5 tablets (30 mg total) by mouth daily.   diclofenac Sodium (VOLTAREN) 1 % GEL Apply 2 g topically 2 (two) times daily as needed.   DULoxetine (CYMBALTA) 30 MG capsule 1 capsule Orally Once a day for 90 days   furosemide  (LASIX ) 20 MG tablet Take  1 tablet (20 mg total) by mouth daily. Take one 20mg  tablet daily. May take an extra tablet daily as needed for swelling or weight gain of 2 pounds overnight or 5 pounds in one week.   gabapentin (NEURONTIN) 100 MG capsule Take 200 mg by mouth daily.   Homeopathic Products (THERAWORX RELIEF) FOAM See admin instructions.   HYDROcodone bit-homatropine (HYCODAN) 5-1.5 MG/5ML syrup Take 5 mLs by mouth as needed.   linaclotide  (LINZESS ) 145 MCG CAPS capsule Take 1 capsule (145 mcg total) by  mouth daily.   memantine  (NAMENDA ) 5 MG tablet Take 1 tablet (5 mg at night) for 2 weeks, then increase to 1 tablet (5 mg) twice a day   pantoprazole  (PROTONIX ) 40 MG tablet Take 1 tablet (40 mg total) by mouth 2 (two) times daily.   rosuvastatin (CRESTOR) 5 MG tablet Take 5 mg by mouth daily.   terconazole (TERAZOL 7) 0.4 % vaginal cream at bedtime.   No facility-administered encounter medications on file as of 08/11/2024.     Objective:     PHYSICAL EXAMINATION:    VITALS:  There were no vitals filed for this visit.  GEN:  The patient appears stated age and is in NAD. HEENT:  Normocephalic, atraumatic.   Neurological examination:  General: NAD, well-groomed, appears stated age. Orientation: The patient is alert. Oriented to person, place and not to date.*** Cranial nerves: There is good facial symmetry.The speech is fluent and clear. Decreased comprehension. No aphasia or dysarthria. Fund of knowledge is appropriate. Recent memory impaired and remote memory is normal.  Attention and concentration are normal.  Able to name objects and repeat phrases.  Hearing is intact to conversational tone ***.   Delayed recall *** Sensation: Sensation is intact to light touch throughout Motor: Strength is at least antigravity x4. DTR's 2/4 in UE/LE      12/25/2022   12:00 PM  Montreal Cognitive Assessment   Visuospatial/ Executive (0/5) 1  Naming (0/3) 3  Attention: Read list of digits (0/2) 1  Attention: Read  list of letters (0/1) 1  Attention: Serial 7 subtraction starting at 100 (0/3) 2  Language: Repeat phrase (0/2) 1  Language : Fluency (0/1) 0  Abstraction (0/2) 1  Delayed Recall (0/5) 4  Orientation (0/6) 5  Total 19  Adjusted Score (based on education) 20        No data to display             Movement examination: Tone: There is normal tone in the UE/LE Abnormal movements:  no tremor.  No myoclonus.  No asterixis.   Coordination:  There is no decremation with RAM's. Normal finger to nose  Gait and Station: The patient has no difficulty arising out of a deep-seated chair without the use of the hands. The patient's stride length is good.  Gait is cautious and narrow.   Thank you for allowing us  the opportunity to participate in the care of this nice patient. Please do not hesitate to contact us  for any questions or concerns.   Total time spent on today's visit was *** minutes dedicated to this patient today, preparing to see patient, examining the patient, ordering tests and/or medications and counseling the patient, documenting clinical information in the EHR or other health record, independently interpreting results and communicating results to the patient/family, discussing treatment and goals, answering patient's questions and coordinating care.  Cc:  Rexanne Ingle, MD  Camie Sevin 08/09/2024 3:32 PM

## 2024-08-11 ENCOUNTER — Ambulatory Visit: Admitting: Physician Assistant

## 2024-08-17 ENCOUNTER — Other Ambulatory Visit (HOSPITAL_BASED_OUTPATIENT_CLINIC_OR_DEPARTMENT_OTHER): Payer: Self-pay | Admitting: Family

## 2024-08-17 DIAGNOSIS — I480 Paroxysmal atrial fibrillation: Secondary | ICD-10-CM

## 2024-08-18 NOTE — Telephone Encounter (Signed)
 Prescription refill request for Eliquis  received. Indication:afib Last office visit:upcoming Scr:1.40  12/24 Age: 83 Weight:73.9  kg  Prescription refilled

## 2024-08-21 ENCOUNTER — Other Ambulatory Visit: Payer: Self-pay | Admitting: Obstetrics and Gynecology

## 2024-08-21 DIAGNOSIS — Z1231 Encounter for screening mammogram for malignant neoplasm of breast: Secondary | ICD-10-CM

## 2024-08-25 ENCOUNTER — Ambulatory Visit (HOSPITAL_BASED_OUTPATIENT_CLINIC_OR_DEPARTMENT_OTHER): Admitting: Cardiology

## 2024-09-07 ENCOUNTER — Encounter (HOSPITAL_BASED_OUTPATIENT_CLINIC_OR_DEPARTMENT_OTHER): Payer: Self-pay | Admitting: Family

## 2024-09-07 ENCOUNTER — Ambulatory Visit (INDEPENDENT_AMBULATORY_CARE_PROVIDER_SITE_OTHER): Admitting: Family

## 2024-09-07 VITALS — BP 142/76 | HR 61 | Ht 61.0 in | Wt 167.8 lb

## 2024-09-07 DIAGNOSIS — I5189 Other ill-defined heart diseases: Secondary | ICD-10-CM

## 2024-09-07 DIAGNOSIS — Z7901 Long term (current) use of anticoagulants: Secondary | ICD-10-CM

## 2024-09-07 DIAGNOSIS — I480 Paroxysmal atrial fibrillation: Secondary | ICD-10-CM | POA: Diagnosis not present

## 2024-09-07 DIAGNOSIS — R002 Palpitations: Secondary | ICD-10-CM

## 2024-09-07 DIAGNOSIS — I10 Essential (primary) hypertension: Secondary | ICD-10-CM

## 2024-09-07 MED ORDER — FUROSEMIDE 20 MG PO TABS: ORAL_TABLET | ORAL | 3 refills | Status: AC

## 2024-09-07 NOTE — Patient Instructions (Addendum)
 Medication Instructions:  CHANGE Furosemide  (Lasix ) to 20mg  daily for 3 days then return to 20mg  every other day  *If you need a refill on your cardiac medications before your next appointment, please call your pharmacy*  Testing/Procedures: Your EKG today was stable from previous.  Follow-Up: At [facility], you and your health needs are our priority.  As part of our continuing mission to provide you with exceptional heart care, our providers are all part of one team.  This team includes your primary Cardiologist (physician) and Advanced Practice Providers or APPs (Physician Assistants and Nurse Practitioners) who all work together to provide you with the care you need, when you need it.  Your next appointment:   1 year(s) with Annabella Scarce, MD or Reche Finder, NP    Other Instructions  Recommend aiming for 64 oz of fluid per day.   To prevent palpitations: Make sure you are adequately hydrated.  Avoid and/or limit caffeine containing beverages like soda or tea. Exercise regularly.  Manage stress well. Some over the counter medications can cause palpitations such as Benadryl, AdvilPM, TylenolPM. Regular Advil or Tylenol  do not cause palpitations.    To prevent or reduce lower extremity swelling: Eat a low salt diet. Salt makes the body hold onto extra fluid which causes swelling. Sit with legs elevated. For example, in the recliner or on an ottoman.  Wear knee-high compression stockings during the daytime. Ones labeled 15-20 mmHg provide good compression.

## 2024-09-07 NOTE — Progress Notes (Signed)
 Cardiology Office Note:  .   Date:  09/07/2024  ID:  Kristin Davidson, DOB 26-Mar-1941, MRN [id number] PCP: Rexanne Ingle, MD  [facility] Providers Cardiologist:  Lonni LITTIE Nanas, MD    History of Present Illness: .   Kristin Davidson is a 83 y.o. female with a hx of HTN, COPD, CKD, atrial fibrillation, RBBB.   Previous admission 05/08/20 with chest pain with diagnosis of colitis. HS troponin 20>29>24>22>23>16. Echo with normal LVEF 60-65%, mild LVH, gr2DD, normal RV function, mild to moderate MR. Esophagitis was presumed cause of her chest pain. She had bradycardia with as low as 40s which was asymptomatic. She was discharged with cardiac event monitor showing 3% atrial fibrillation burden with average HR 130bpm and 2318 episodes of SVT, longest 53 sec. She was started on Eliquis  5mg  BID CHADS2VASC of 4.   She was seen by Dr. Nanas 10/04/20 doing overall well from a cardiac perspective. She reported abdominal and chest pain which resolved when she ate. She was recommended for sleep study. Given elevated BP her Amlodipine  was increased to 10mg  daily. Lexiscan  myoview  was ordered which was low risk study.    Seen in follow-up with her daughter 05/09/2021.  Clearance was provided for EGD.  Given lower extremity edema and hypertension Lasix  was initiated.  She was seen by Dr. Court for workup which showed no PAD.   At visit 01/10/2022 lower extremity and diastolic dysfunction were well-managed on Lasix  1 mg Tues/Sat and 20mg  every other day. Intermittent palpitations were noted, offered PRN Propranolol which she politely declined.   At visit 06/11/23 she was doing well from a cardiac perspective.    She presents today for follow up with her daughter. Notes leg pain related to her spinal stenosis. Last epidural injection 4 years ago, encouraged ot follow up with Dr. Eldonna. She would like to avoid surgery. Her daughter notes she is very active despite this, the other week climbing on a chair  which she has been encouraged not to do. Notes palpitations once per day lasting a few seconds which independently resolve. These have been ongoing and often occur when her legs are hurting. She notes not hydrating well, does not like water but does drink juice during the day. Her Lasix  was adjusted to every other day by PCP due to renal function, notes some increased bilateral pretibial edema.  ROS: Please see the history of present illness.    All other systems reviewed and are negative.   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 Confirmed by Vannie Mora ([id number]) on 09/07/2024 8:14:25 PM    Cardiac Studies & Procedures   ______________________________________________________________________________________________   STRESS TESTS  MYOCARDIAL PERFUSION IMAGING 11/01/2020  Interpretation Summary  There was no ST segment deviation noted during stress.  The left ventricular ejection fraction is normal (55-65%).  Nuclear stress EF: 64%.  Defect 1: There is a small defect of mild severity present in the basal inferior, mid inferior and apical inferior location.  Defect 2: There is a small defect of mild severity present in the basal anteroseptal, mid anteroseptal and apical septal location.  The study is normal.  This is a low risk study.  1. Fixed  inferior and anteroseptal perfusion defects with normal wall motion in these regions, suggesting artifact 2. Low risk study   ECHOCARDIOGRAM  ECHOCARDIOGRAM COMPLETE 05/09/2020  Narrative ECHOCARDIOGRAM REPORT    Patient Name:   Kristin Davidson Date of Exam: 05/09/2020 Medical Rec #:  [id number]       Height:       61.0  in Accession #:    [id number]      Weight:       167.5 lb Date of Birth:  11-Nov-1940       BSA:          1.752 m Patient Age:    78 years        BP:           179/76 mmHg Patient Gender: F               HR:           47 bpm. Exam Location:  Inpatient  Procedure: 2D Echo and Intracardiac Opacification Agent  Indications:    Chest Pain 786.50 / R07.9  History:        Patient has no prior history of Echocardiogram examinations. COPD, Arrythmias:RBBB; Risk Factors:Hypertension. Chronic kidney disease. GERD. Gout. abdominal pain, nausea and vomiting. Chest pain and cough.  Sonographer:    Annabella Fell RDCS Referring Phys: [id number] CAROLE N HALL  IMPRESSIONS   1. Left ventricular ejection fraction, by estimation, is 60 to 65%. The left ventricle has normal function. The left ventricle has no regional wall motion abnormalities. There is mild left ventricular hypertrophy. Left ventricular diastolic parameters are consistent with Grade II diastolic dysfunction (pseudonormalization). 2. Right ventricular systolic function is normal. The right ventricular size is normal. Tricuspid regurgitation signal is inadequate for assessing PA pressure. 3. The mitral valve is normal in structure. Mild to moderate mitral valve regurgitation. No evidence of mitral stenosis. 4. The aortic valve is normal in structure. Aortic valve regurgitation is not visualized. No aortic stenosis is present. 5. The inferior vena cava is normal in size with greater than 50% respiratory variability, suggesting right atrial pressure of 3 mmHg.  FINDINGS Left Ventricle: Left ventricular ejection fraction, by estimation, is 60 to 65%. The left ventricle has normal function. The left ventricle has no regional wall motion abnormalities. Definity  contrast agent was given IV to delineate the left ventricular endocardial borders. The left ventricular internal cavity size was normal in size. There is mild left ventricular hypertrophy.  Left ventricular diastolic parameters are consistent with Grade II diastolic dysfunction (pseudonormalization).  Right Ventricle: The right ventricular size is normal. No increase in right ventricular wall thickness. Right ventricular systolic function is normal. Tricuspid regurgitation signal is inadequate for assessing PA pressure.  Left Atrium: Left atrial size was normal in size.  Right Atrium: Right atrial size was normal in size.  Pericardium: There is no evidence of pericardial effusion.  Mitral Valve: The mitral valve is normal in structure. Normal mobility of the mitral valve leaflets. Mild to moderate mitral valve regurgitation. No evidence of mitral valve stenosis.  Tricuspid Valve: The tricuspid valve is normal in structure. Tricuspid valve regurgitation is mild . No evidence of tricuspid stenosis.  Aortic Valve: The aortic valve is normal in structure. Aortic valve regurgitation is not visualized. No aortic stenosis is present.  Pulmonic Valve: The pulmonic valve was not well visualized. Pulmonic valve regurgitation is not visualized. No evidence of pulmonic stenosis.  Aorta: The aortic root is normal in size and structure.  Venous: The inferior vena cava is normal in size with greater than 50% respiratory variability, suggesting right atrial pressure of 3 mmHg.  IAS/Shunts: No atrial level shunt detected by color flow Doppler.   LEFT VENTRICLE PLAX 2D LVIDd:         4.00 cm Diastology LVIDs:         3.30 cm LV e' lateral:   8.63 cm/s LV PW:         1.30 cm LV E/e' lateral: 12.1 LV IVS:        1.20 cm LV e' medial:    5.93 cm/s LVOT diam:     1.80 cm LV E/e' medial:  17.5 LV SV:         53 LV SV Index:   30 LVOT Area:     2.54 cm   RIGHT VENTRICLE RV S prime:     12.50 cm/s TAPSE (M-mode): 2.0 cm  LEFT ATRIUM             Index       RIGHT ATRIUM           Index LA diam:        3.30 cm 1.88 cm/m  RA Area:     13.50 cm LA Vol (A2C):   46.9 ml 26.77 ml/m RA  Volume:   31.80 ml  18.15 ml/m LA Vol (A4C):   53.3 ml 30.43 ml/m LA Biplane Vol: 51.8 ml 29.57 ml/m AORTIC VALVE LVOT Vmax:   90.80 cm/s LVOT Vmean:  60.600 cm/s LVOT VTI:    0.209 m  AORTA Ao Root diam: 2.70 cm  MITRAL VALVE MV Area (PHT): 3.77 cm     SHUNTS MV Decel Time: 201 msec     Systemic VTI:  0.21 m MV E velocity: 104.00 cm/s  Systemic Diam: 1.80 cm MV A velocity: 62.40 cm/s MV E/A ratio:  1.67  Soyla Merck MD Electronically signed by Soyla Merck MD Signature Date/Time: 05/09/2020/9:17:06 AM    Final    MONITORS  LONG TERM MONITOR-LIVE TELEMETRY (3-14 DAYS) 05/10/2020  Narrative  3% atrial fibrillation burden with average HR 130 bpm. Longest episode lasted 51 minutes.  2318 episodes of SVT, longest lasting 53 seconds.  Isolated atrial ectopy was frequent (8.5%) with occasional couplets (1.1%)  One 6 beat run of NSVT.  Second monitor showed no Afib and improvement in SVT (only 9 episodes over 3 days) and PAC frequency (2.2%)  First monitor (05/10/20 - 05/16/20): 6 days of data recorded on Zio monitor. Patient had a min HR of 38 bpm, max HR of 218 bpm, and avg HR of 57 bpm. Predominant underlying rhythm was Sinus Rhythm. One 6 beat run of NSVT.  3% atrial fibrillation burden with average HR 130 bpm.  Longest episode lasted 51 minutes.  2318 episodes of SVT, longest lasting 53 seconds.  No high degree block or pauses noted.   Isolated ventricular ectopy was rare (<1%). Isolated atrial ectopy was frequent (8.5%) with occasional couplets (1.1%).  There were 4 triggered events, which corresponded to AF and sinus rhyth with PACs.  Second monitor (05/19/20 to 05/22/20): 3 days of data recorded on Zio monitor. Patient had a min HR of 39 bpm, max HR of 176 bpm, and avg HR of 52 bpm. Predominant underlying rhythm was Sinus Rhythm. No VT, atrial fibrillation, high degree block, or pauses noted. 9 episodes of SVT, longest lasting 9 beats.  Isolated ventricular ectopy was  rare (<1%). Isolated atrial ectopy was occasional (  2.2%). There were 2 triggered events, corresponding to sinus rhythm with PACs.       ______________________________________________________________________________________________        Risk Assessment/Calculations:     CHA2DS2-VASc Score = 4   This indicates a 4.8% annual risk of stroke. The patient's score is based upon: CHF History: 0 HTN History: 1 Diabetes History: 0 Stroke History: 0 Vascular Disease History: 0 Age Score: 2 Gender Score: 1            Physical Exam:   VS:  BP (!) 142/76 (BP Location: Right Arm, Patient Position: Sitting, Cuff Size: Normal)   Pulse 61   Ht 5' 1 (1.549 m)   Wt 167 lb 12.5 oz (76.1 kg)   SpO2 94%   BMI 31.70 kg/m    Wt Readings from Last 3 Encounters:  09/07/24 167 lb 12.5 oz (76.1 kg)  06/03/24 163 lb (73.9 kg)  06/11/23 161 lb (73 kg)    GEN: Well nourished, overweight, well developed in no acute distress NECK: No JVD; No carotid bruits CARDIAC: RRR, no murmurs, rubs, gallops RESPIRATORY:  Clear to auscultation without rales, wheezing or rhonchi  ABDOMEN: Soft, non-tender, non-distended EXTREMITIES:  Non pitting pretibial edema; No deformity   ASSESSMENT AND PLAN: .    LE edema / diastolic dysfunction - nonpitting bilateral pretibial edema. Increase lasix  to 20mg  daily x 3 days then return to every other day. Leg elevation encouraged.   HTN - BP mildly elevated in clinic. Well controlled at other recent office visits. Hesitant to over treat given age, relative inactivity. Continue Amlodipine  10mg  dialy, Benazepril  30mg  daily. Discussed to monitor BP at home at least 2 hours after medications and sitting for 5-10 minutes. .  CKD - Careful titration of diuretic and antihypertensive.    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 Stable from previous. No indication for further workup. Avoid AV nodal blocking agents.   Palpitations / PAF / Chronic anticoagulation - Reports  palpitations daily which independently resolve. Avoid  AV nodal blocking therapy due to bifascicular block. Suspect due to pain from spinal stenosis, encouraged to follow up with Dr. Eldonna. Increased hydration encouraged. CHA2DS2-VASc Score = 4 [CHF History: 0, HTN History: 1, Diabetes History: 0, Stroke History: 0, Vascular Disease History: 0, Age Score: 2, Gender Score: 1].  Therefore, the patient's annual risk of stroke is 4.8 %.    Continue Eliquis  2.5mg  BID. Reduced dose due to age, renal function.   DM2 - 03/12/24 A1c 6.7  Spinal stenosis - last epidural 4 years ago.  Encouraged to schedule follow up with Dr. Eldonna regarding leg pain radiating from her back.        Dispo: follow up in 1 year  Signed, Reche GORMAN Finder, NP

## 2024-09-14 ENCOUNTER — Other Ambulatory Visit (HOSPITAL_BASED_OUTPATIENT_CLINIC_OR_DEPARTMENT_OTHER): Payer: Self-pay | Admitting: Family

## 2024-09-14 DIAGNOSIS — I480 Paroxysmal atrial fibrillation: Secondary | ICD-10-CM

## 2024-09-14 NOTE — Telephone Encounter (Signed)
 Prescription refill request for Eliquis  received. Indication: PAF Last office visit: 09/07/24  JAYSON Finder NP Scr: 1.61 on 05/30/22  Past due.  Sent message with RX to get labs done. Age: 83 Weight: 76.1kg  Based on above findings Eliquis  2.5mg  twice daily is the appropriate dose.  Pt is apst due for labs.  Note sent to Pharmacy pt needs to call fto get lab worK  (CBC/BMP).  Refill approved x 1

## 2024-09-16 ENCOUNTER — Inpatient Hospital Stay
Admission: RE | Admit: 2024-09-16 | Discharge: 2024-09-16 | Attending: Obstetrics and Gynecology | Admitting: Obstetrics and Gynecology

## 2024-09-16 DIAGNOSIS — Z1231 Encounter for screening mammogram for malignant neoplasm of breast: Secondary | ICD-10-CM

## 2024-10-14 ENCOUNTER — Ambulatory Visit: Admitting: Podiatry

## 2024-10-26 ENCOUNTER — Other Ambulatory Visit (HOSPITAL_BASED_OUTPATIENT_CLINIC_OR_DEPARTMENT_OTHER): Payer: Self-pay | Admitting: Cardiology

## 2024-10-26 DIAGNOSIS — I480 Paroxysmal atrial fibrillation: Secondary | ICD-10-CM

## 2024-10-27 ENCOUNTER — Telehealth: Payer: Self-pay | Admitting: Family

## 2024-10-27 NOTE — Telephone Encounter (Signed)
 Left the pts daughter Sharlet (on [place]) a message to call the office back.

## 2024-10-27 NOTE — Telephone Encounter (Signed)
 STAT if patient feels like he/she is going to faint   1. Are you feeling dizzy, lightheaded, or faint right now? Yes, vertigo, dizziness    2. Have you passed out?  No, but has almost fell twice (If yes move to [facility])   3. Do you have any other symptoms? Reflux, some chest pain    4. Have you checked your HR and BP (record if available)? No   Pt daughter calling because her mother has been having vertigo since last Tuesday. She has been very off balance and almost fell twice. She also has complained of intermittent GERD symptoms and chest pain. No chest pain at time of call. Please advise.   Sharlet requesting c/b at [phone]

## 2024-10-30 NOTE — Telephone Encounter (Signed)
 Returned call to daughter. Verified patient by name/DOB.  Unfortunately Mrs. Chestang and her daughter have both developed viral illness since her last call.   Mrs. Chandlers intermittent dizziness with sensation of room spinning and feeling a little wobbly. No previous vertigo. Mrs. Dozier and her daughter have been resting given recent viral illness. Advised could use OTC Meclizine PRN.   Mrs. Paulsen reports chest pain under her breast. This resolved with an extra tablet of Pantoprazole .  Reassurance provided not cardiac in origin.  Encouraged to use virtual visit if needed for cold symptoms.   Orazio Weller S Adelaide Pfefferkorn, NP

## 2024-10-30 NOTE — Telephone Encounter (Signed)
 If symptoms consistent with vertigo (spinning sensation, worse with head movement) may utilize OTC Meclizine 25mg  PRN. Has upcoming neurology follow up 11/17/24.   To prevent lightheadedness ensure staying well hydrated, making position changes slowly, eating regular meals.   If near-syncope or concern for cardiac etiology after discussion with nursing team, will likely require OV.  Kristin Davidson S Zionna Homewood, NP

## 2024-11-02 ENCOUNTER — Other Ambulatory Visit: Payer: Self-pay | Admitting: Cardiology

## 2024-11-02 DIAGNOSIS — I10 Essential (primary) hypertension: Secondary | ICD-10-CM

## 2024-11-17 ENCOUNTER — Ambulatory Visit: Admitting: Physician Assistant
